# Patient Record
Sex: Female | Born: 1955
Health system: Southern US, Community
[De-identification: ages and names within clinical notes are randomized; demographics above are authoritative.]

## PROBLEM LIST (undated history)

## (undated) DIAGNOSIS — L570 Actinic keratosis: Secondary | ICD-10-CM

## (undated) DIAGNOSIS — K219 Gastro-esophageal reflux disease without esophagitis: Secondary | ICD-10-CM

## (undated) DIAGNOSIS — E119 Type 2 diabetes mellitus without complications: Secondary | ICD-10-CM

## (undated) DIAGNOSIS — G4730 Sleep apnea, unspecified: Secondary | ICD-10-CM

## (undated) DIAGNOSIS — M1990 Unspecified osteoarthritis, unspecified site: Secondary | ICD-10-CM

## (undated) HISTORY — PX: CARDIAC CATHETERIZATION: SHX172

## (undated) HISTORY — DX: Actinic keratosis: L57.0

## (undated) HISTORY — PX: OTHER SURGICAL HISTORY: SHX169

---

## 2001-12-16 HISTORY — PX: ABDOMINAL HYSTERECTOMY: SHX81

## 2004-12-27 ENCOUNTER — Ambulatory Visit: Payer: Self-pay | Admitting: Obstetrics and Gynecology

## 2005-04-16 ENCOUNTER — Ambulatory Visit: Payer: Self-pay

## 2005-07-29 ENCOUNTER — Ambulatory Visit: Payer: Self-pay | Admitting: Orthopaedic Surgery

## 2006-02-06 ENCOUNTER — Ambulatory Visit: Payer: Self-pay | Admitting: Obstetrics and Gynecology

## 2006-02-27 ENCOUNTER — Ambulatory Visit: Payer: Self-pay | Admitting: Obstetrics and Gynecology

## 2007-06-15 ENCOUNTER — Ambulatory Visit: Payer: Self-pay | Admitting: Unknown Physician Specialty

## 2007-10-21 ENCOUNTER — Encounter: Payer: Self-pay | Admitting: Family Medicine

## 2007-11-16 ENCOUNTER — Encounter: Payer: Self-pay | Admitting: Family Medicine

## 2007-12-17 ENCOUNTER — Encounter: Payer: Self-pay | Admitting: Family Medicine

## 2007-12-17 HISTORY — PX: PARTIAL KNEE ARTHROPLASTY: SHX2174

## 2008-02-01 ENCOUNTER — Ambulatory Visit: Payer: Self-pay | Admitting: Sports Medicine

## 2009-02-23 ENCOUNTER — Ambulatory Visit: Payer: Self-pay | Admitting: Unknown Physician Specialty

## 2009-03-02 ENCOUNTER — Inpatient Hospital Stay: Payer: Self-pay | Admitting: Unknown Physician Specialty

## 2009-04-13 ENCOUNTER — Ambulatory Visit: Payer: Self-pay | Admitting: Unknown Physician Specialty

## 2009-12-16 HISTORY — PX: EYE SURGERY: SHX253

## 2011-02-04 ENCOUNTER — Encounter: Payer: Self-pay | Admitting: Dermatology

## 2012-12-11 ENCOUNTER — Encounter (HOSPITAL_COMMUNITY): Payer: Self-pay | Admitting: Respiratory Therapy

## 2012-12-15 NOTE — Pre-Procedure Instructions (Signed)
20 Elaine Wright  12/15/2012   Your procedure is scheduled on:  Wednesday, January 8th.  Report to [facility] at 6:30 AM.  Call this number if you have problems the morning of surgery: [phone]   Remember: Nothing to eat or drink after Midnight.    Take these medicines the morning of surgery with A SIP OF WATER: Omeprazole (Prilosec).              Stop taking Naproxen (Naprosyn).   Do not wear jewelry, make-up or nail polish.  Do not wear lotions, powders, or perfumes. You may wear deodorant.  Do not shave 48 hours prior to surgery. Men may shave face and neck.  Do not bring valuables to the hospital.  Contacts, dentures or bridgework may not be worn into surgery.  Leave suitcase in the car. After surgery it may be brought to your room.  For patients admitted to the hospital, checkout time is 11:00 AM the day of discharge.   Patients discharged the day of surgery will not be allowed to drive home.  Name and phone number of your driver: -NA    Special Instructions: Shower using CHG 2 nights before surgery and the night before surgery.  If you shower the day of surgery use CHG.  Use special wash - you have one bottle of CHG for all showers.  You should use approximately 1/3 of the bottle for each shower.   Please read over the following fact sheets that you were given: Pain Booklet, Coughing and Deep Breathing, Blood Transfusion Information and Surgical Site Infection Prevention

## 2012-12-17 ENCOUNTER — Encounter (HOSPITAL_COMMUNITY): Payer: Self-pay

## 2012-12-17 ENCOUNTER — Encounter (HOSPITAL_COMMUNITY)
Admission: RE | Admit: 2012-12-17 | Discharge: 2012-12-17 | Disposition: A | Discharge status: Home / Self Care | Payer: 59 | Source: Ambulatory Visit | Attending: Orthopedic Surgery | Admitting: Orthopedic Surgery

## 2012-12-17 ENCOUNTER — Ambulatory Visit (HOSPITAL_COMMUNITY)
Admission: RE | Admit: 2012-12-17 | Discharge: 2012-12-17 | Disposition: A | Discharge status: Home / Self Care | Payer: 59 | Source: Ambulatory Visit | Attending: Surgery | Admitting: Surgery

## 2012-12-17 DIAGNOSIS — Z01818 Encounter for other preprocedural examination: Secondary | ICD-10-CM | POA: Insufficient documentation

## 2012-12-17 DIAGNOSIS — F17200 Nicotine dependence, unspecified, uncomplicated: Secondary | ICD-10-CM | POA: Insufficient documentation

## 2012-12-17 HISTORY — DX: Unspecified osteoarthritis, unspecified site: M19.90

## 2012-12-17 HISTORY — DX: Gastro-esophageal reflux disease without esophagitis: K21.9

## 2012-12-17 LAB — URINALYSIS, ROUTINE W REFLEX MICROSCOPIC
Bilirubin Urine: NEGATIVE
Glucose, UA: NEGATIVE mg/dL
Hgb urine dipstick: NEGATIVE
Ketones, ur: NEGATIVE mg/dL
Leukocytes, UA: NEGATIVE
Nitrite: NEGATIVE
Protein, ur: NEGATIVE mg/dL
Specific Gravity, Urine: 1.009 (ref 1.005–1.030)
Urobilinogen, UA: 0.2 mg/dL (ref 0.0–1.0)
pH: 6.5 (ref 5.0–8.0)

## 2012-12-17 LAB — SURGICAL PCR SCREEN
MRSA, PCR: NEGATIVE
Staphylococcus aureus: NEGATIVE

## 2012-12-17 NOTE — Progress Notes (Signed)
Pt is unable to wear blood bank bracelet to work, appointment made for patient to come in on 12/23/11 for lab work.

## 2012-12-22 ENCOUNTER — Encounter (HOSPITAL_COMMUNITY)
Admission: RE | Admit: 2012-12-22 | Discharge: 2012-12-22 | Disposition: A | Discharge status: Home / Self Care | Payer: 59 | Source: Ambulatory Visit | Attending: Orthopedic Surgery | Admitting: Orthopedic Surgery

## 2012-12-22 LAB — COMPREHENSIVE METABOLIC PANEL
ALT: 20 U/L (ref 0–35)
AST: 19 U/L (ref 0–37)
Albumin: 4.2 g/dL (ref 3.5–5.2)
Alkaline Phosphatase: 85 U/L (ref 39–117)
BUN: 15 mg/dL (ref 6–23)
CO2: 29 mEq/L (ref 19–32)
Calcium: 9.4 mg/dL (ref 8.4–10.5)
Chloride: 99 mEq/L (ref 96–112)
Creatinine, Ser: 0.66 mg/dL (ref 0.50–1.10)
GFR calc Af Amer: >90 mL/min (ref >90)
GFR calc non Af Amer: >90 mL/min (ref >90)
Glucose, Bld: 100 mg/dL — ABNORMAL HIGH (ref 70–99)
Potassium: 4.4 mEq/L (ref 3.5–5.1)
Sodium: 138 mEq/L (ref 135–145)
Total Bilirubin: 0.2 mg/dL — ABNORMAL LOW (ref 0.3–1.2)
Total Protein: 7.3 g/dL (ref 6.0–8.3)

## 2012-12-22 LAB — ABO/RH: ABO/RH(D): A POS

## 2012-12-22 LAB — CBC
HCT: 41.1 % (ref 36.0–46.0)
Hemoglobin: 13.9 g/dL (ref 12.0–15.0)
MCH: 29.6 pg (ref 26.0–34.0)
MCHC: 33.8 g/dL (ref 30.0–36.0)
MCV: 87.4 fL (ref 78.0–100.0)
Platelets: 263 10*3/uL (ref 150–400)
RBC: 4.7 MIL/uL (ref 3.87–5.11)
RDW: 13.7 % (ref 11.5–15.5)
WBC: 7.8 10*3/uL (ref 4.0–10.5)

## 2012-12-22 LAB — APTT: aPTT: 34 seconds (ref 24–37)

## 2012-12-22 LAB — PROTIME-INR
INR: 0.86 (ref 0.00–1.49)
Prothrombin Time: 11.7 seconds (ref 11.6–15.2)

## 2012-12-22 LAB — TYPE AND SCREEN
ABO/RH(D): A POS
Antibody Screen: NEGATIVE

## 2012-12-22 MED ORDER — CEFAZOLIN SODIUM-DEXTROSE 2-3 GM-% IV SOLR
2.0000 g | INTRAVENOUS | Status: AC
  Administered 2012-12-23: 2 g via INTRAVENOUS
  Filled 2012-12-22: qty 50

## 2012-12-22 NOTE — Pre-Procedure Instructions (Signed)
20 Elaine Wright  12/22/2012   Your procedure is scheduled on:  Wednesday, January 8th.  Report to [facility] at 6:30AM.   Call this number if you have problems the morning of surgery: [phone]  Remember:Nothing to eat or drink after Midnight.     Take these medicines the morning of surgery with A SIP OF WATER:  May take Omeprazole (Prilosec).              Stop taking Naproxen.    Do not wear jewelry, make-up or nail polish.  Do not wear lotions, powders, or perfumes. You may wear deodorant.  Do not shave 48 hours prior to surgery. Men may shave face and neck.  Do not bring valuables to the hospital.  Contacts, dentures or bridgework may not be worn into surgery.  Leave suitcase in the car. After surgery it may be brought to your room.  For patients admitted to the hospital, checkout time is 11:00 AM the day of discharge.   Patients discharged the day of surgery will not be allowed to drive home.  Name and phone number of your driver:  NA  Special Instructions: Shower with CHG wash (Bactoshield) tonight and again in the am prior to arriving to hospital.    Please read over the following fact sheets that you were given: Pain Booklet, Coughing and Deep Breathing, Blood Transfusion Information and Surgical Site Infection Prevention

## 2012-12-22 NOTE — H&P (Signed)
  [facility] [address]   [address] [place], [place] [address] [phone] A Division of [facility]  Loreta Ave, M.D.   Robert A. Thurston Hole, M.D.   Burnell Blanks, M.D.   Eulas Post, M.D.   Lunette Stands, M.D Buford Dresser, M.D.  Charlsie Quest, M.D.   Estell Harpin, M.D.   Melina Fiddler, M.D. Genene Churn. Barry Dienes, PA-C            Kirstin A. Shepperson, PA-C Josh Poulsbo, PA-C [place], [place]   RE: Elaine Wright, Elaine Wright                                [id number]      DOB: 1955/12/22 PROGRESS NOTE: 12-15-12 Chief complaint: Left knee pain.  History of present illness: 57 year-old white female with a history of end stage DJD, left knee, and chronic pain.  Returns.  States that knee symptoms are unchanged from previous visit.  She is wanting to proceed with left total knee replacement as scheduled.  Patient stated that podiatrist, Dr. Charlsie Merles, wants to perform a bunionectomy procedure on her when she is about three weeks post-op from her knee surgery.  Said that he felt this would be a good time to do the surgery since she will be recovering from the left knee.   Current medications: Naprelan and Omeprazole. Allergies: No known drug allergies. Past medical/surgical history: Cataract surgery, right medial knee unicompartmental replacement in 2009 and left thumb surgery. Review of systems: Patient currently denies lightheadedness, dizziness, fevers or chills, cardiac, pulmonary, GI, GU or neuro issues. Family history: Positive for diabetes, hypertension and heart disease. Social history: Admits smoking one-quarter pack per day x 15 years.  Admits occasional alcohol consumption.  Patient is married and employed with [employer].    EXAMINATION: Height: 5?2.  Weight: 143 pounds.  Respirations: 20.  Blood pressure: 152/93.  Pulse: 67.  Temperature: 98.0.  Pleasant white female, alert and oriented x 3 and in  no acute distress.  Gait is antalgic.  No increase in respiratory effort.  Head is normocephalic, a traumatic.  PERRLA, EOMI.  Lungs: CTA bilaterally.  No wheezes.  Heart: RRR.  S1 and S2.  No murmurs.  Abdomen: Round and non-distended.  NBS x 4.  Soft and non-tender.  Left knee: Decreased range of motion.  Positive crepitus.  Positive effusion.  Joint line tender.  Ligaments stable.  Calf non-tender.  Neurovascularly intact.  Skin warm and dry.   X-RAYS: Previous films show end stage DJD with periarticular spurs.   IMPRESSION: End stage DJD, left knee, and chronic pain.  Failed conservative treatment.  PLAN: We will proceed with left total knee replacement as scheduled.  Surgical procedure, along with potential rehab/recovery time discussed.  All questions answered.  Advised patient that we do not recommend her having bunion surgery three weeks after her left total knee replacement is done.  She will discuss this with Dr. Charlsie Merles.  We will give her a better idea of timing for that procedure depending upon her recovery.  All questions answered.    Genene Churn. Barry Dienes, PA-C   Electronically verified by Loreta Ave, M.D. JMO:jjh D 12-15-12 T 12-17-12

## 2012-12-23 ENCOUNTER — Encounter (HOSPITAL_COMMUNITY): Payer: Self-pay | Admitting: Surgery

## 2012-12-23 ENCOUNTER — Inpatient Hospital Stay (HOSPITAL_COMMUNITY): Payer: 59 | Admitting: Certified Registered Nurse Anesthetist

## 2012-12-23 ENCOUNTER — Encounter (HOSPITAL_COMMUNITY)
Admission: RE | Disposition: A | Discharge status: Home Health | Payer: Self-pay | Source: Ambulatory Visit | Attending: Orthopedic Surgery

## 2012-12-23 ENCOUNTER — Inpatient Hospital Stay (HOSPITAL_COMMUNITY)
Admission: RE | Admit: 2012-12-23 | Discharge: 2012-12-25 | DRG: 470 | Disposition: A | Discharge status: Home Health | Payer: 59 | Source: Ambulatory Visit | Attending: Orthopedic Surgery | Admitting: Orthopedic Surgery

## 2012-12-23 ENCOUNTER — Inpatient Hospital Stay (HOSPITAL_COMMUNITY): Payer: 59

## 2012-12-23 ENCOUNTER — Encounter (HOSPITAL_COMMUNITY): Payer: Self-pay | Admitting: Certified Registered Nurse Anesthetist

## 2012-12-23 DIAGNOSIS — F17200 Nicotine dependence, unspecified, uncomplicated: Secondary | ICD-10-CM | POA: Diagnosis present

## 2012-12-23 DIAGNOSIS — Z96659 Presence of unspecified artificial knee joint: Secondary | ICD-10-CM

## 2012-12-23 DIAGNOSIS — M1710 Unilateral primary osteoarthritis, unspecified knee: Principal | ICD-10-CM | POA: Diagnosis present

## 2012-12-23 DIAGNOSIS — M25469 Effusion, unspecified knee: Secondary | ICD-10-CM | POA: Diagnosis present

## 2012-12-23 HISTORY — PX: TOTAL KNEE ARTHROPLASTY: SHX125

## 2012-12-23 LAB — CBC
HCT: 38.8 % (ref 36.0–46.0)
Hemoglobin: 12.8 g/dL (ref 12.0–15.0)
MCH: 29 pg (ref 26.0–34.0)
MCHC: 33 g/dL (ref 30.0–36.0)
MCV: 88 fL (ref 78.0–100.0)
Platelets: 237 10*3/uL (ref 150–400)
RBC: 4.41 MIL/uL (ref 3.87–5.11)
RDW: 13.7 % (ref 11.5–15.5)
WBC: 14 10*3/uL — ABNORMAL HIGH (ref 4.0–10.5)

## 2012-12-23 LAB — CREATININE, SERUM
Creatinine, Ser: 0.59 mg/dL (ref 0.50–1.10)
GFR calc Af Amer: >90 mL/min (ref >90)
GFR calc non Af Amer: >90 mL/min (ref >90)

## 2012-12-23 SURGERY — ARTHROPLASTY, KNEE, TOTAL
Anesthesia: General | Site: Knee | Laterality: Left | Wound class: Clean

## 2012-12-23 MED ORDER — ONDANSETRON HCL 4 MG PO TABS: 4.0000 mg | ORAL_TABLET | Freq: Four times a day (QID) | ORAL | Status: DC | PRN

## 2012-12-23 MED ORDER — FENTANYL CITRATE 0.05 MG/ML IJ SOLN
INTRAMUSCULAR | Status: DC | PRN
  Administered 2012-12-23 (×5): 50 ug via INTRAVENOUS

## 2012-12-23 MED ORDER — POTASSIUM CHLORIDE IN NACL 20-0.9 MEQ/L-% IV SOLN
INTRAVENOUS | Status: DC
  Administered 2012-12-23 – 2012-12-24 (×2): via INTRAVENOUS
  Filled 2012-12-23 (×5): qty 1000

## 2012-12-23 MED ORDER — MIDAZOLAM HCL 5 MG/5ML IJ SOLN
INTRAMUSCULAR | Status: DC | PRN
  Administered 2012-12-23: 2 mg via INTRAVENOUS

## 2012-12-23 MED ORDER — OXYCODONE HCL 5 MG/5ML PO SOLN: 5.0000 mg | Freq: Once | ORAL | Status: DC | PRN

## 2012-12-23 MED ORDER — METOCLOPRAMIDE HCL 10 MG PO TABS: 5.0000 mg | ORAL_TABLET | Freq: Three times a day (TID) | ORAL | Status: DC | PRN

## 2012-12-23 MED ORDER — PHENOL 1.4 % MT LIQD: 1.0000 | OROMUCOSAL | Status: DC | PRN

## 2012-12-23 MED ORDER — BUPIVACAINE LIPOSOME 1.3 % IJ SUSP
INTRAMUSCULAR | Status: DC | PRN
  Administered 2012-12-23: 20 mL

## 2012-12-23 MED ORDER — DOCUSATE SODIUM 100 MG PO CAPS
100.0000 mg | ORAL_CAPSULE | Freq: Two times a day (BID) | ORAL | Status: DC
  Administered 2012-12-23 – 2012-12-25 (×3): 100 mg via ORAL
  Filled 2012-12-23 (×5): qty 1

## 2012-12-23 MED ORDER — ONDANSETRON HCL 4 MG/2ML IJ SOLN: 4.0000 mg | Freq: Four times a day (QID) | INTRAMUSCULAR | Status: DC | PRN

## 2012-12-23 MED ORDER — HYDROMORPHONE HCL PF 1 MG/ML IJ SOLN
INTRAMUSCULAR | Status: AC
  Filled 2012-12-23: qty 1

## 2012-12-23 MED ORDER — DEXAMETHASONE SODIUM PHOSPHATE 4 MG/ML IJ SOLN
INTRAMUSCULAR | Status: DC | PRN
  Administered 2012-12-23: 4 mg via INTRAVENOUS

## 2012-12-23 MED ORDER — ONDANSETRON HCL 4 MG/2ML IJ SOLN
4.0000 mg | Freq: Four times a day (QID) | INTRAMUSCULAR | Status: DC | PRN
  Administered 2012-12-23: 4 mg via INTRAVENOUS
  Filled 2012-12-23: qty 2

## 2012-12-23 MED ORDER — ROCURONIUM BROMIDE 100 MG/10ML IV SOLN
INTRAVENOUS | Status: DC | PRN
  Administered 2012-12-23: 50 mg via INTRAVENOUS

## 2012-12-23 MED ORDER — HYDROMORPHONE HCL PF 1 MG/ML IJ SOLN
0.2500 mg | INTRAMUSCULAR | Status: DC | PRN
  Administered 2012-12-23 (×2): 0.5 mg via INTRAVENOUS

## 2012-12-23 MED ORDER — WARFARIN VIDEO: Freq: Once | Status: DC

## 2012-12-23 MED ORDER — HYDROCODONE-ACETAMINOPHEN 10-325 MG PO TABS
1.0000 | ORAL_TABLET | ORAL | Status: DC | PRN
  Administered 2012-12-24: 1 via ORAL
  Administered 2012-12-24: 2 via ORAL
  Administered 2012-12-24 – 2012-12-25 (×3): 1 via ORAL
  Filled 2012-12-23 (×3): qty 1
  Filled 2012-12-23: qty 2
  Filled 2012-12-23: qty 1

## 2012-12-23 MED ORDER — MENTHOL 3 MG MT LOZG: 1.0000 | LOZENGE | OROMUCOSAL | Status: DC | PRN

## 2012-12-23 MED ORDER — WARFARIN - PHARMACIST DOSING INPATIENT: Freq: Every day | Status: DC

## 2012-12-23 MED ORDER — SODIUM CHLORIDE 0.9 % IR SOLN
Status: DC | PRN
  Administered 2012-12-23: 3000 mL

## 2012-12-23 MED ORDER — SENNOSIDES-DOCUSATE SODIUM 8.6-50 MG PO TABS
1.0000 | ORAL_TABLET | Freq: Every evening | ORAL | Status: DC | PRN
  Administered 2012-12-25: 1 via ORAL
  Filled 2012-12-23: qty 1

## 2012-12-23 MED ORDER — PANTOPRAZOLE SODIUM 40 MG PO TBEC
40.0000 mg | DELAYED_RELEASE_TABLET | Freq: Every day | ORAL | Status: DC
  Administered 2012-12-23 – 2012-12-25 (×3): 40 mg via ORAL
  Filled 2012-12-23 (×3): qty 1

## 2012-12-23 MED ORDER — BUPIVACAINE HCL 0.25 % IJ SOLN
INTRAMUSCULAR | Status: DC | PRN
  Administered 2012-12-23: 10 mL

## 2012-12-23 MED ORDER — ENOXAPARIN SODIUM 30 MG/0.3ML ~~LOC~~ SOLN
30.0000 mg | SUBCUTANEOUS | Status: DC
  Administered 2012-12-24 – 2012-12-25 (×2): 30 mg via SUBCUTANEOUS
  Filled 2012-12-23 (×4): qty 0.3

## 2012-12-23 MED ORDER — CEFAZOLIN SODIUM 1-5 GM-% IV SOLN
1.0000 g | Freq: Three times a day (TID) | INTRAVENOUS | Status: AC
  Administered 2012-12-23 – 2012-12-24 (×2): 1 g via INTRAVENOUS
  Filled 2012-12-23 (×2): qty 50

## 2012-12-23 MED ORDER — METHOCARBAMOL 100 MG/ML IJ SOLN
500.0000 mg | Freq: Four times a day (QID) | INTRAVENOUS | Status: DC | PRN
  Administered 2012-12-23: 500 mg via INTRAVENOUS
  Filled 2012-12-23: qty 5

## 2012-12-23 MED ORDER — WARFARIN SODIUM 5 MG PO TABS
5.0000 mg | ORAL_TABLET | Freq: Once | ORAL | Status: AC
  Administered 2012-12-23: 5 mg via ORAL
  Filled 2012-12-23: qty 1

## 2012-12-23 MED ORDER — SODIUM CHLORIDE 0.9 % IV SOLN
INTRAVENOUS | Status: DC | PRN
  Administered 2012-12-23: 40 mL via INTRAMUSCULAR

## 2012-12-23 MED ORDER — PROPOFOL 10 MG/ML IV BOLUS
INTRAVENOUS | Status: DC | PRN
  Administered 2012-12-23: 150 mg via INTRAVENOUS

## 2012-12-23 MED ORDER — NEOSTIGMINE METHYLSULFATE 1 MG/ML IJ SOLN
INTRAMUSCULAR | Status: DC | PRN
  Administered 2012-12-23: 4 mg via INTRAVENOUS

## 2012-12-23 MED ORDER — LACTATED RINGERS IV SOLN
INTRAVENOUS | Status: DC | PRN
  Administered 2012-12-23 (×2): via INTRAVENOUS

## 2012-12-23 MED ORDER — METOCLOPRAMIDE HCL 5 MG/ML IJ SOLN: 5.0000 mg | Freq: Three times a day (TID) | INTRAMUSCULAR | Status: DC | PRN

## 2012-12-23 MED ORDER — BUPIVACAINE LIPOSOME 1.3 % IJ SUSP
20.0000 mL | Freq: Once | INTRAMUSCULAR | Status: DC
  Filled 2012-12-23: qty 20

## 2012-12-23 MED ORDER — ACETAMINOPHEN 650 MG RE SUPP: 650.0000 mg | Freq: Four times a day (QID) | RECTAL | Status: DC | PRN

## 2012-12-23 MED ORDER — LIDOCAINE HCL (CARDIAC) 20 MG/ML IV SOLN
INTRAVENOUS | Status: DC | PRN
  Administered 2012-12-23: 80 mg via INTRAVENOUS

## 2012-12-23 MED ORDER — OXYCODONE HCL 5 MG PO TABS: 5.0000 mg | ORAL_TABLET | Freq: Once | ORAL | Status: DC | PRN

## 2012-12-23 MED ORDER — COUMADIN BOOK
Freq: Once | Status: DC
  Filled 2012-12-23: qty 1

## 2012-12-23 MED ORDER — HYDROMORPHONE HCL PF 1 MG/ML IJ SOLN
0.5000 mg | INTRAMUSCULAR | Status: DC | PRN
  Administered 2012-12-23: 0.5 mg via INTRAVENOUS
  Filled 2012-12-23 (×2): qty 1

## 2012-12-23 MED ORDER — ACETAMINOPHEN 325 MG PO TABS
650.0000 mg | ORAL_TABLET | Freq: Four times a day (QID) | ORAL | Status: DC | PRN
  Administered 2012-12-24 – 2012-12-25 (×2): 650 mg via ORAL
  Filled 2012-12-23 (×2): qty 2

## 2012-12-23 MED ORDER — METHOCARBAMOL 500 MG PO TABS
500.0000 mg | ORAL_TABLET | Freq: Four times a day (QID) | ORAL | Status: DC | PRN
  Administered 2012-12-24 – 2012-12-25 (×3): 500 mg via ORAL
  Filled 2012-12-23 (×3): qty 1

## 2012-12-23 MED ORDER — GLYCOPYRROLATE 0.2 MG/ML IJ SOLN
INTRAMUSCULAR | Status: DC | PRN
  Administered 2012-12-23: 0.6 mg via INTRAVENOUS

## 2012-12-23 MED ORDER — ALUM & MAG HYDROXIDE-SIMETH 200-200-20 MG/5ML PO SUSP: 30.0000 mL | ORAL | Status: DC | PRN

## 2012-12-23 MED ORDER — BUPIVACAINE HCL (PF) 0.25 % IJ SOLN
INTRAMUSCULAR | Status: AC
  Filled 2012-12-23: qty 30

## 2012-12-23 SURGICAL SUPPLY — 55 items
BANDAGE ESMARK 6X9 LF (GAUZE/BANDAGES/DRESSINGS) ×1 IMPLANT
BLADE SAG 18X100X1.27 (BLADE) ×4 IMPLANT
BNDG ESMARK 6X9 LF (GAUZE/BANDAGES/DRESSINGS) ×2
BOOTCOVER CLEANROOM LRG (PROTECTIVE WEAR) ×4 IMPLANT
BOWL SMART MIX CTS (DISPOSABLE) ×2 IMPLANT
CEMENT BONE SIMPLEX SPEEDSET (Cement) ×4 IMPLANT
CLOTH BEACON ORANGE TIMEOUT ST (SAFETY) ×2 IMPLANT
COVER BACK TABLE 24X17X13 BIG (DRAPES) ×2 IMPLANT
COVER SURGICAL LIGHT HANDLE (MISCELLANEOUS) ×2 IMPLANT
CUFF TOURNIQUET SINGLE 34IN LL (TOURNIQUET CUFF) ×2 IMPLANT
DRAPE EXTREMITY T 121X128X90 (DRAPE) ×2 IMPLANT
DRAPE PROXIMA HALF (DRAPES) ×2 IMPLANT
DRAPE U-SHAPE 47X51 STRL (DRAPES) ×2 IMPLANT
DRSG PAD ABDOMINAL 8X10 ST (GAUZE/BANDAGES/DRESSINGS) ×2 IMPLANT
DURAPREP 26ML APPLICATOR (WOUND CARE) ×2 IMPLANT
ELECT CAUTERY BLADE 6.4 (BLADE) ×2 IMPLANT
ELECT REM PT RETURN 9FT ADLT (ELECTROSURGICAL) ×2
ELECTRODE REM PT RTRN 9FT ADLT (ELECTROSURGICAL) ×1 IMPLANT
EVACUATOR 1/8 PVC DRAIN (DRAIN) ×2 IMPLANT
FACESHIELD LNG OPTICON STERILE (SAFETY) ×2 IMPLANT
GAUZE XEROFORM 5X9 LF (GAUZE/BANDAGES/DRESSINGS) ×2 IMPLANT
GLOVE BIOGEL PI IND STRL 8 (GLOVE) ×1 IMPLANT
GLOVE BIOGEL PI INDICATOR 8 (GLOVE) ×1
GLOVE ORTHO TXT STRL SZ7.5 (GLOVE) ×2 IMPLANT
GOWN PREVENTION PLUS XLARGE (GOWN DISPOSABLE) ×4 IMPLANT
GOWN STRL NON-REIN LRG LVL3 (GOWN DISPOSABLE) ×4 IMPLANT
GOWN STRL REIN 2XL XLG LVL4 (GOWN DISPOSABLE) ×2 IMPLANT
HANDPIECE INTERPULSE COAX TIP (DISPOSABLE) ×1
IMMOBILIZER KNEE 22 UNIV (SOFTGOODS) ×2 IMPLANT
IMMOBILIZER KNEE 24 THIGH 36 (MISCELLANEOUS) IMPLANT
IMMOBILIZER KNEE 24 UNIV (MISCELLANEOUS)
KIT BASIN OR (CUSTOM PROCEDURE TRAY) ×2 IMPLANT
KIT ROOM TURNOVER OR (KITS) ×2 IMPLANT
MANIFOLD NEPTUNE II (INSTRUMENTS) ×2 IMPLANT
NS IRRIG 1000ML POUR BTL (IV SOLUTION) ×2 IMPLANT
PACK TOTAL JOINT (CUSTOM PROCEDURE TRAY) ×2 IMPLANT
PAD ARMBOARD 7.5X6 YLW CONV (MISCELLANEOUS) ×4 IMPLANT
PAD CAST 4YDX4 CTTN HI CHSV (CAST SUPPLIES) ×1 IMPLANT
PADDING CAST COTTON 4X4 STRL (CAST SUPPLIES) ×1
PADDING CAST COTTON 6X4 STRL (CAST SUPPLIES) ×2 IMPLANT
RUBBERBAND STERILE (MISCELLANEOUS) ×2 IMPLANT
SET HNDPC FAN SPRY TIP SCT (DISPOSABLE) ×1 IMPLANT
SPONGE GAUZE 4X4 12PLY (GAUZE/BANDAGES/DRESSINGS) ×2 IMPLANT
STAPLER VISISTAT 35W (STAPLE) ×2 IMPLANT
SUCTION FRAZIER TIP 10 FR DISP (SUCTIONS) ×2 IMPLANT
SUT VIC AB 1 CTX 36 (SUTURE) ×2
SUT VIC AB 1 CTX36XBRD ANBCTR (SUTURE) ×2 IMPLANT
SUT VIC AB 2-0 CT1 27 (SUTURE) ×2
SUT VIC AB 2-0 CT1 TAPERPNT 27 (SUTURE) ×2 IMPLANT
SYR 30ML LL (SYRINGE) ×2 IMPLANT
SYR 30ML SLIP (SYRINGE) ×2 IMPLANT
TOWEL OR 17X24 6PK STRL BLUE (TOWEL DISPOSABLE) ×2 IMPLANT
TOWEL OR 17X26 10 PK STRL BLUE (TOWEL DISPOSABLE) ×2 IMPLANT
TRAY FOLEY CATH 14FR (SET/KITS/TRAYS/PACK) ×2 IMPLANT
WATER STERILE IRR 1000ML POUR (IV SOLUTION) ×6 IMPLANT

## 2012-12-23 NOTE — Interval H&P Note (Signed)
History and Physical Interval Note:  12/23/2012 8:21 AM  Elaine Wright  has presented today for surgery, with the diagnosis of DJD LEFT KNEE  The various methods of treatment have been discussed with the patient and family. After consideration of risks, benefits and other options for treatment, the patient has consented to  Procedure(s) (LRB) with comments: TOTAL KNEE ARTHROPLASTY (Left) as a surgical intervention .  The patient's history has been reviewed, patient examined, no change in status, stable for surgery.  I have reviewed the patient's chart and labs.  Questions were answered to the patient's satisfaction.     Amit Meloy F

## 2012-12-23 NOTE — Brief Op Note (Signed)
12/23/2012  12:40 PM  PATIENT:  Lurlean Horns  57 y.o. female  PRE-OPERATIVE DIAGNOSIS:  DJD LEFT KNEE  POST-OPERATIVE DIAGNOSIS:  degenerative joint disease left knee  PROCEDURE:  Procedure(s) (LRB) with comments: TOTAL KNEE ARTHROPLASTY (Left)  SURGEON:  Surgeon(s) and Role:    * Loreta Ave, MD - Primary  PHYSICIAN ASSISTANT: Zonia Kief M   ANESTHESIA:   general  EBL:  Total I/O In: 1250 [I.V.:1000; Other:250] Out: 150 [Urine:150]   DRAINS:  hemovac  LOCAL MEDICATIONS USED:  OTHER .  Exparel/injectable NS  20:40   SPECIMEN:  No Specimen  DISPOSITION OF SPECIMEN:  N/A  COUNTS:  YES  TOURNIQUET:   Total Tourniquet Time Documented: Thigh (Left) - 64 minutes   PATIENT DISPOSITION:  PACU - hemodynamically stable.

## 2012-12-23 NOTE — Progress Notes (Signed)
Orthopedic Tech Progress Note Patient Details:  Elaine Wright Jun 09, 1956 [id number]  Patient ID: Elaine Wright, female   DOB: December 12, 1956, 57 y.o.   MRN: [id number] Trapeze bar patient helper Viewed order from doctor's order list Nikki Dom 12/23/2012, 4:57 PM

## 2012-12-23 NOTE — Evaluation (Signed)
Physical Therapy Evaluation Patient Details Name: Elaine Wright MRN: [id number] DOB: 01-26-1956 Today's Date: 12/23/2012 Time: 4098-1191 PT Time Calculation (min): 45 min  PT Assessment / Plan / Recommendation Clinical Impression  Pt is a 57 y/o female s/p L TKA.      PT Assessment  Patient needs continued PT services    Follow Up Recommendations  Home health PT;Supervision - Intermittent    Does the patient have the potential to tolerate intense rehabilitation    Yes  Barriers to Discharge  none      Equipment Recommendations  None recommended by PT    Recommendations for Other Services   None  Frequency 7X/week    Precautions / Restrictions Precautions Precautions: Knee Required Braces or Orthoses: Knee Immobilizer - Left Restrictions Weight Bearing Restrictions: Yes LLE Weight Bearing: Weight bearing as tolerated   Pertinent Vitals/Pain No c/o pain       Mobility  Bed Mobility Bed Mobility: Supine to Sit;Sit to Supine Supine to Sit: 4: Min assist;HOB flat Sit to Supine: 4: Min assist Details for Bed Mobility Assistance: Min assist for L LE  Transfers Transfers: Sit to Stand;Stand to [employer] Transfers Sit to Stand: 4: Min guard Stand to Sit: 4: Min guard Stand Pivot Transfers: 4: Min guard Details for Transfer Assistance: cues for technique.  Ambulation/Gait Ambulation/Gait Assistance: 4: Min guard Ambulation Distance (Feet): 15 Feet Assistive device: Rolling walker Ambulation/Gait Assistance Details: cues for sequencing and WBAT.   Gait Pattern: Step-to pattern Stairs: No Wheelchair Mobility Wheelchair Mobility: No     PT Diagnosis: Difficulty walking;Generalized weakness;Acute pain  PT Problem List: Decreased strength;Decreased range of motion;Decreased activity tolerance;Decreased balance;Decreased mobility;Decreased knowledge of use of DME;Pain PT Treatment Interventions: Gait training;DME instruction;Functional mobility training;Stair  training;Therapeutic activities;Neuromuscular re-education   PT Goals Acute Rehab PT Goals PT Goal Formulation: With patient Time For Goal Achievement: 12/30/12 Potential to Achieve Goals: Good Pt will go Supine/Side to Sit: Independently;with HOB 0 degrees PT Goal: Supine/Side to Sit - Progress: Goal set today Pt will go Sit to Supine/Side: Independently;with HOB 0 degrees PT Goal: Sit to Supine/Side - Progress: Goal set today Pt will go Sit to Stand: with supervision PT Goal: Sit to Stand - Progress: Goal set today Pt will go Stand to Sit: with modified independence PT Goal: Stand to Sit - Progress: Goal set today Pt will Ambulate: >150 feet;with modified independence;with least restrictive assistive device PT Goal: Ambulate - Progress: Goal set today Pt will Go Up / Down Stairs: 3-5 stairs;Flight;with supervision;with least restrictive assistive device PT Goal: Up/Down Stairs - Progress: Goal set today Pt will Perform Home Exercise Program: with supervision, verbal cues required/provided PT Goal: Perform Home Exercise Program - Progress: Goal set today  Visit Information  Last PT Received On: 12/23/12    Subjective Data  Subjective: Agree to PT eval Patient Stated Goal: Walk without pain.    Prior Functioning  Home Living Lives With: Spouse Available Help at Discharge: Family;Available 24 hours/day Type of Home: House Home Access: Stairs to enter [employer] of Steps: 3 Entrance Stairs-Rails: None Home Layout: 1/2 bath on main level;Two level Alternate Level Stairs-Number of Steps: 13 Alternate Level Stairs-Rails: Right Bathroom Shower/Tub: Tub/shower unit;Walk-in shower Bathroom Toilet: Standard Bathroom Accessibility: Yes How Accessible: Accessible via walker Home Adaptive Equipment: Walker - rolling;Bedside commode/3-in-1 Prior Function Level of Independence: Independent Able to Take Stairs?: Yes Driving: Yes Vocation: Full time  employment Communication Communication: No difficulties    Cognition  Overall Cognitive Status:  Appears within functional limits for tasks assessed/performed Arousal/Alertness: Awake/alert Orientation Level: Appears intact for tasks assessed Behavior During Session: [facility] for tasks performed    Extremity/Trunk Assessment Right Upper Extremity Assessment RUE ROM/Strength/Tone: [facility] for tasks assessed Left Upper Extremity Assessment LUE ROM/Strength/Tone: [facility] for tasks assessed Right Lower Extremity Assessment RLE ROM/Strength/Tone: [facility] for tasks assessed Left Lower Extremity Assessment LLE ROM/Strength/Tone: Unable to fully assess   Balance    End of Session PT - End of Session Equipment Utilized During Treatment: Gait belt;Left knee immobilizer Activity Tolerance: Patient tolerated treatment well;Other (comment) (c/o worsening nausea in sitting/standing. ) Patient left: in chair;with call bell/phone within reach Nurse Communication: Mobility status CPM Left Knee CPM Left Knee: Off Left Knee Flexion (Degrees): 60  Left Knee Extension (Degrees): 0  Additional Comments: CPM off at 1600  GP     Jalyn Rosero 12/23/2012, 6:35 PM  Ranyia Witting L. Lawerance Matsuo DPT [phone]

## 2012-12-23 NOTE — Progress Notes (Signed)
Report given to Katrece Roediger rn as caregiver 

## 2012-12-23 NOTE — Progress Notes (Signed)
ANTICOAGULATION CONSULT NOTE - Initial Consult  Pharmacy Consult for Coumadin Indication: VTE prophylaxis  Allergies  Allergen Reactions  . Morphine And Related Other (See Comments)    Confusion    Patient Measurements:   Weight ~ 143 lbs Height ~ 62"  Vital Signs: Temp: 98.2 F (36.8 C) (01/08 1438) Temp src: Oral (01/08 0639) BP: 119/83 mmHg (01/08 1438) Pulse Rate: 78  (01/08 1438)  Labs:  Basename 12/23/12 1534 12/22/12 1506  HGB 12.8 13.9  HCT 38.8 41.1  PLT 237 263  APTT -- 34  LABPROT -- 11.7  INR -- 0.86  HEPARINUNFRC -- --  CREATININE -- 0.66  CKTOTAL -- --  CKMB -- --  TROPONINI -- --    CrCl is unknown because there is no height on file for the current visit.   Medical History: Past Medical History  Diagnosis Date  . GERD (gastroesophageal reflux disease)   . Arthritis     Medications:  Prescriptions prior to admission  Medication Sig Dispense Refill  . naproxen (NAPROSYN) 500 MG tablet Take 500 mg by mouth daily.      Marland Kitchen omeprazole (PRILOSEC) 20 MG capsule Take 20 mg by mouth daily.        Assessment: 57 yo F admitted 12/23/2012 for L TKA.  To start Coumadin for post-op VTE prophylaxis.  Baseline INR = 0.86.  Goal of Therapy:  INR 2-3 Monitor platelets by anticoagulation protocol: Yes   Plan:  Coumadin 5mg  PO x 1 tonight. Daily INR. Coumadin book. Coumadin video.  [employer], Pharm.D., BCPS Clinical Pharmacist Pager [phone] 12/23/2012 4:19 PM

## 2012-12-23 NOTE — Progress Notes (Signed)
Orthopedic Tech Progress Note Patient Details:  Elaine Wright 30-Mar-1956 [id number]  CPM Left Knee CPM Left Knee: On Left Knee Flexion (Degrees): 60  Left Knee Extension (Degrees): 0    Shawnie Pons 12/23/2012, 12:47 PM

## 2012-12-23 NOTE — Anesthesia Preprocedure Evaluation (Addendum)
Anesthesia Evaluation  Patient identified by MRN, date of birth, ID band Patient awake    Reviewed: Allergy & Precautions, H&P , NPO status , Patient's Chart, lab work & pertinent test results  History of Anesthesia Complications Negative for: history of anesthetic complications  Airway Mallampati: I TM Distance: >3 FB Neck ROM: Full    Dental  (+) Teeth Intact and Dental Advisory Given   Pulmonary Current Smoker (1/2 ppd),          Cardiovascular negative cardio ROS      Neuro/Psych negative neurological ROS     GI/Hepatic Neg liver ROS, GERD-  Medicated and Controlled,  Endo/Other  negative endocrine ROS  Renal/GU negative Renal ROS     Musculoskeletal negative musculoskeletal ROS (+)   Abdominal   Peds  Hematology negative hematology ROS (+)   Anesthesia Other Findings   Reproductive/Obstetrics negative OB ROS                          Anesthesia Physical Anesthesia Plan  ASA: II  Anesthesia Plan: General and Regional   Post-op Pain Management: MAC Combined w/ Regional for Post-op pain   Induction: Intravenous  Airway Management Planned: LMA  Additional Equipment:   Intra-op Plan:   Post-operative Plan:   Informed Consent: I have reviewed the patients History and Physical, chart, labs and discussed the procedure including the risks, benefits and alternatives for the proposed anesthesia with the patient or authorized representative who has indicated his/her understanding and acceptance.     Plan Discussed with: CRNA and Surgeon  Anesthesia Plan Comments:         Anesthesia Quick Evaluation

## 2012-12-23 NOTE — Transfer of Care (Signed)
Immediate Anesthesia Transfer of Care Note  Patient: Elaine Wright  Procedure(s) Performed: Procedure(s) (LRB) with comments: TOTAL KNEE ARTHROPLASTY (Left)  Patient Location: PACU  Anesthesia Type:General  Level of Consciousness: awake, alert  and oriented  Airway & Oxygen Therapy: Patient Spontanous Breathing and Patient connected to nasal cannula oxygen  Post-op Assessment: Report given to PACU RN, Post -op Vital signs reviewed and stable and Patient moving all extremities  Post vital signs: Reviewed and stable  Complications: No apparent anesthesia complications

## 2012-12-23 NOTE — Anesthesia Postprocedure Evaluation (Signed)
Anesthesia Post Note  Patient: Elaine Wright  Procedure(s) Performed: Procedure(s) (LRB): TOTAL KNEE ARTHROPLASTY (Left)  Anesthesia type: General  Patient location: PACU  Post pain: Pain level controlled and Adequate analgesia  Post assessment: Post-op Vital signs reviewed, Patient's Cardiovascular Status Stable, Respiratory Function Stable, Patent Airway and Pain level controlled  Last Vitals:  Filed Vitals:   12/23/12 0639  BP: 112/80  Pulse: 70  Temp: 36.4 C  Resp: 18    Post vital signs: Reviewed and stable  Level of consciousness: awake, alert  and oriented  Complications: No apparent anesthesia complications

## 2012-12-23 NOTE — Preoperative (Signed)
Beta Blockers   Reason not to administer Beta Blockers:Not Applicable 

## 2012-12-24 ENCOUNTER — Encounter (HOSPITAL_COMMUNITY): Payer: Self-pay | Admitting: Orthopedic Surgery

## 2012-12-24 LAB — CBC
HCT: 32.7 % — ABNORMAL LOW (ref 36.0–46.0)
Hemoglobin: 11 g/dL — ABNORMAL LOW (ref 12.0–15.0)
MCH: 29.3 pg (ref 26.0–34.0)
MCHC: 33.6 g/dL (ref 30.0–36.0)
MCV: 87.2 fL (ref 78.0–100.0)
Platelets: 219 10*3/uL (ref 150–400)
RBC: 3.75 MIL/uL — ABNORMAL LOW (ref 3.87–5.11)
RDW: 14 % (ref 11.5–15.5)
WBC: 11.6 10*3/uL — ABNORMAL HIGH (ref 4.0–10.5)

## 2012-12-24 LAB — BASIC METABOLIC PANEL
BUN: 10 mg/dL (ref 6–23)
CO2: 27 mEq/L (ref 19–32)
Calcium: 8.3 mg/dL — ABNORMAL LOW (ref 8.4–10.5)
Chloride: 104 mEq/L (ref 96–112)
Creatinine, Ser: 0.6 mg/dL (ref 0.50–1.10)
GFR calc Af Amer: >90 mL/min (ref >90)
GFR calc non Af Amer: >90 mL/min (ref >90)
Glucose, Bld: 121 mg/dL — ABNORMAL HIGH (ref 70–99)
Potassium: 4.3 mEq/L (ref 3.5–5.1)
Sodium: 139 mEq/L (ref 135–145)

## 2012-12-24 LAB — PROTIME-INR
INR: 1.13 (ref 0.00–1.49)
Prothrombin Time: 14.3 seconds (ref 11.6–15.2)

## 2012-12-24 MED ORDER — WARFARIN SODIUM 4 MG PO TABS
4.0000 mg | ORAL_TABLET | Freq: Once | ORAL | Status: AC
  Administered 2012-12-24: 4 mg via ORAL
  Filled 2012-12-24: qty 1

## 2012-12-24 NOTE — Progress Notes (Signed)
ANTICOAGULATION CONSULT NOTE - Follow Up Consult  Pharmacy Consult for Coumadin Indication: VTE prophylaxis  Allergies  Allergen Reactions  . Morphine And Related Other (See Comments)    Confusion    Patient Measurements:  Weight: 64.8kg  Vital Signs: Temp: 98 F (36.7 C) (01/09 1318) Temp src: Oral (01/09 0540) BP: 114/75 mmHg (01/09 1318) Pulse Rate: 89  (01/09 1318)  Labs:  Basename 12/24/12 0542 12/23/12 1534 12/22/12 1506  HGB 11.0* 12.8 --  HCT 32.7* 38.8 41.1  PLT 219 237 263  APTT -- -- 34  LABPROT 14.3 -- 11.7  INR 1.13 -- 0.86  HEPARINUNFRC -- -- --  CREATININE 0.60 0.59 0.66  CKTOTAL -- -- --  CKMB -- -- --  TROPONINI -- -- --    CrCl is unknown because there is no height on file for the current visit.   Medications:  Lovenox 30mg  SQ q24h  Assessment: 56yof on Coumadin for VTE prophylaxis s/p TKA. INR (1.13) is subtherapeutic as expected. Patient is also receiving Lovenox until INR >1.8 per MD order. - H/H trend down, Plts stable - No significant bleeding reported  Goal of Therapy:  INR 2-3   Plan:  1. Coumadin 4mg  po x 1 today 2. Follow-up AM INR  Cleon Dew [phone] 12/24/2012,1:22 PM

## 2012-12-24 NOTE — Evaluation (Signed)
Occupational Therapy Evaluation Patient Details Name: Elaine Wright MRN: [id number] DOB: 1956-09-27 Today's Date: 12/24/2012 Time: 4098-1191 OT Time Calculation (min): 27 min  OT Assessment / Plan / Recommendation Clinical Impression  Pt doing well POD 1 LTKR. All education completed. Pt will have necessary level of A from family upon d/c.     OT Assessment  Patient does not need any further OT services    Follow Up Recommendations  No OT follow up    Barriers to Discharge      Equipment Recommendations  None recommended by OT    Recommendations for Other Services    Frequency       Precautions / Restrictions Precautions Precautions: Knee Required Braces or Orthoses: Knee Immobilizer - Left Knee Immobilizer - Left: On except when in CPM Restrictions Weight Bearing Restrictions: No LLE Weight Bearing: Weight bearing as tolerated   Pertinent Vitals/Pain Pt reported 5/10 pain with mobility. Repositioned for comfort.    ADL  Grooming: Performed;Wash/dry hands;Min guard Where Assessed - Grooming: Supported standing Upper Body Bathing: Set up Where Assessed - Upper Body Bathing: Unsupported sitting Lower Body Bathing: Minimal assistance Where Assessed - Lower Body Bathing: Supported sit to stand Upper Body Dressing: Set up Where Assessed - Upper Body Dressing: Unsupported sitting Lower Body Dressing: Minimal assistance Where Assessed - Lower Body Dressing: Supported sit to Pharmacist, hospital: Hydrographic surveyor Method: Sit to Barista: Comfort height toilet;Grab bars Toileting - Architect and Hygiene: Min guard Where Assessed - Engineer, mining and Hygiene: Sit to stand from 3-in-1 or toilet Equipment Used: Rolling walker;Gait belt;Reacher Transfers/Ambulation Related to ADLs: Pt ambulated to the bathroom with minguard A and RW. ADL Comments: Educated pt how to safely step into walk in shower and complete LB  dressing with reacher. Pt with good return demo.     OT Diagnosis:    OT Problem List:   OT Treatment Interventions:     OT Goals    Visit Information  Last OT Received On: 12/24/12 Assistance Needed: +1    Subjective Data  Subjective: I'm not feeling too bad... Patient Stated Goal: Not asked.   Prior Functioning     Home Living Lives With: Spouse Available Help at Discharge: Family Type of Home: House Home Access: Stairs to enter Secretary/administrator of Steps: 3 Entrance Stairs-Rails: None Home Layout: Two level;1/2 bath on main level Alternate Level Stairs-Number of Steps: 13 Alternate Level Stairs-Rails: Right Bathroom Shower/Tub: Walk-in shower;Tub/shower unit Bathroom Toilet: Standard Home Adaptive Equipment: Built-in shower seat;Shower chair with back;Walker - rolling;Other (comment) (toilet riser with handles.) Prior Function Level of Independence: Independent Able to Take Stairs?: Yes Driving: Yes Vocation: Full time employment Communication Communication: No difficulties Dominant Hand: Right         Vision/Perception     Cognition  Overall Cognitive Status: Appears within functional limits for tasks assessed/performed Arousal/Alertness: Awake/alert Orientation Level: Appears intact for tasks assessed Behavior During Session: [facility] for tasks performed    Extremity/Trunk Assessment Right Upper Extremity Assessment RUE ROM/Strength/Tone: [facility] for tasks assessed Left Upper Extremity Assessment LUE ROM/Strength/Tone: [facility] for tasks assessed     Mobility .  Transfers Sit to Stand: 4: Min guard;With upper extremity assist;With armrests;From chair/3-in-1;From toilet Stand to Sit: 4: Min guard;With upper extremity assist;With armrests;To chair/3-in-1;To toilet Details for Transfer Assistance: min cues for hand placement and LE management.     Shoulder Instructions     Exercise    Balance  End of Session OT - End of Session Equipment  Utilized During Treatment: Gait belt Activity Tolerance: Patient tolerated treatment well Patient left: in chair;with call bell/phone within reach;with family/visitor present  GO     Amiah Frohlich A OTR/L [phone] 12/24/2012, 11:42 AM

## 2012-12-24 NOTE — Progress Notes (Signed)
CARE MANAGEMENT NOTE 12/24/2012  Patient:  Elaine Wright, Elaine Wright   Account Number:  [id number]  Date Initiated:  12/24/2012  Documentation initiated by:  Vance Peper  Subjective/Objective Assessment:   57 yr old female s/p left total knee arthroplasty.     Action/Plan:   CM spoke with patient concerning home health and DME needs at discharge. Patient preoperatively setup with [facility], no changes.Patient has rolling walker, 3in1 and CPM to be delivered to her home. Family support.   Anticipated DC Date:  12/25/2012   Anticipated DC Plan:  HOME W HOME HEALTH SERVICES      DC Planning Services  CM consult      [facility] Choice  HOME HEALTH   Choice offered to / List presented to:  C-1 Patient        HH arranged  HH-2 PT  HH-1 RN      [facility] agency  [facility]   Status of service:  Completed, signed off Medicare Important Message given?   (If response is "NO", the following Medicare IM given date fields will be blank) Date Medicare IM given:   Date Additional Medicare IM given:    Discharge Disposition:  HOME W HOME HEALTH SERVICES  Per UR Regulation:    If discussed at Long Length of Stay Meetings, dates discussed:    Comments:

## 2012-12-24 NOTE — Progress Notes (Signed)
Physical Therapy Treatment Patient Details Name: Elaine Wright MRN: [id number] DOB: Nov 25, 1956 Today's Date: 12/24/2012 Time: 0902-0922 PT Time Calculation (min): 20 min  PT Assessment / Plan / Recommendation Comments on Treatment Session  Pt doing well with mobility.  C/o feeling "overheated" while ambulating.  Resolved once pt sitting in chair.      Follow Up Recommendations  Home health PT;Supervision - Intermittent     Does the patient have the potential to tolerate intense rehabilitation     Barriers to Discharge        Equipment Recommendations  None recommended by PT    Recommendations for Other Services    Frequency 7X/week   Plan Discharge plan remains appropriate;Frequency remains appropriate    Precautions / Restrictions Precautions Precautions: Knee Required Braces or Orthoses: Knee Immobilizer - Left Knee Immobilizer - Left: On except when in CPM Restrictions Weight Bearing Restrictions: No LLE Weight Bearing: Weight bearing as tolerated   Pertinent Vitals/Pain 3/10 pain in knee.  No intervention required per pt.      Mobility  Bed Mobility Bed Mobility: Supine to Sit;Sitting - Scoot to Edge of Bed Supine to Sit: 5: Supervision Sitting - Scoot to Edge of Bed: 5: Supervision Details for Bed Mobility Assistance: Verbal cueing for technique.  Transfers Transfers: Sit to Stand;Stand to Sit Sit to Stand: 4: Min guard Stand to Sit: 4: Min guard Details for Transfer Assistance: cues for technique.  Ambulation/Gait Ambulation/Gait Assistance: 4: Min guard Ambulation Distance (Feet): 45 Feet Assistive device: Rolling walker Ambulation/Gait Assistance Details: Verbal and tactile cueing to decrease dependence on UEs  Gait Pattern: Step-to pattern;Antalgic Stairs: No    Exercises Total Joint Exercises Ankle Circles/Pumps: Both;10 reps   PT Diagnosis:    PT Problem List:   PT Treatment Interventions:     PT Goals Acute Rehab PT Goals PT Goal  Formulation: With patient Time For Goal Achievement: 12/30/12 Potential to Achieve Goals: Good Pt will go Supine/Side to Sit: Independently;with HOB 0 degrees PT Goal: Supine/Side to Sit - Progress: Progressing toward goal Pt will go Sit to Supine/Side: Independently;with HOB 0 degrees PT Goal: Sit to Supine/Side - Progress: Progressing toward goal Pt will go Sit to Stand: with supervision PT Goal: Sit to Stand - Progress: Progressing toward goal Pt will go Stand to Sit: with modified independence PT Goal: Stand to Sit - Progress: Progressing toward goal Pt will Ambulate: >150 feet;with modified independence;with least restrictive assistive device PT Goal: Ambulate - Progress: Progressing toward goal Pt will Go Up / Down Stairs: 3-5 stairs;Flight;with supervision;with least restrictive assistive device Pt will Perform Home Exercise Program: with supervision, verbal cues required/provided PT Goal: Perform Home Exercise Program - Progress: Progressing toward goal  Visit Information  Last PT Received On: 12/24/12 Assistance Needed: +1    Subjective Data  Subjective: I feel pretty good   Cognition  Overall Cognitive Status: Appears within functional limits for tasks assessed/performed Arousal/Alertness: Awake/alert Orientation Level: Appears intact for tasks assessed Behavior During Session: [facility] for tasks performed    Balance     End of Session PT - End of Session Equipment Utilized During Treatment: Gait belt;Left knee immobilizer Activity Tolerance: Patient tolerated treatment well;Other (comment) Patient left: in chair;with call bell/phone within reach Nurse Communication: Mobility status   GP     Tarrence Enck 12/24/2012, 10:35 AM Theron Arista L. Gussie Murton DPT [phone]

## 2012-12-24 NOTE — Progress Notes (Signed)
Subjective: Doing well.  Pain controlled.  No complaints.  C/o feeling dizzy when up with therapy yesterday.    Objective: Vital signs in last 24 hours: Temp:  [97.2 F (36.2 C)-99 F (37.2 C)] 98.9 F (37.2 C) (01/09 0540) Pulse Rate:  [67-95] 83  (01/09 0540) Resp:  [12-18] 18  (01/09 0540) BP: (95-168)/(65-89) 107/66 mmHg (01/09 0540) SpO2:  [92 %-100 %] 99 % (01/09 0540) FiO2 (%):  [2 %] 2 % (01/08 1130)  Intake/Output from previous day: 01/08 0701 - 01/09 0700 In: 2535.2 [I.V.:2153.2; IV Piggyback:57] Out: 3800 [Urine:3300; Drains:500] Intake/Output this shift:     Basename 12/24/12 0542 12/23/12 1534 12/22/12 1506  HGB 11.0* 12.8 13.9    Basename 12/24/12 0542 12/23/12 1534  WBC 11.6* 14.0*  RBC 3.75* 4.41  HCT 32.7* 38.8  PLT 219 237    Basename 12/24/12 0542 12/23/12 1534 12/22/12 1506  NA 139 -- 138  K 4.3 -- 4.4  CL 104 -- 99  CO2 27 -- 29  BUN 10 -- 15  CREATININE 0.60 0.59 --  GLUCOSE 121* -- 100*  CALCIUM 8.3* -- 9.4    Basename 12/24/12 0542 12/22/12 1506  LABPT -- --  INR 1.13 0.86    Exam:  Dressing c/d/i.  Calf nt, nvi.   Assessment/Plan: Continue rehab.  Anticipate d/c home Friday or Saturday. D/c dilaudid.     Advait Buice M 12/24/2012, 9:13 AM

## 2012-12-24 NOTE — Progress Notes (Signed)
Physical Therapy Treatment Patient Details Name: Elaine Wright MRN: [id number] DOB: 1956-06-23 Today's Date: 12/24/2012 Time: 4098-1191 PT Time Calculation (min): 39 min  PT Assessment / Plan / Recommendation Comments on Treatment Session  Pt continues to feel "whoozy" in standing/ambulating.  Wiill benefit from assessment to rule orthostatic hypotension.      Follow Up Recommendations  Home health PT;Supervision - Intermittent     Does the patient have the potential to tolerate intense rehabilitation     Barriers to Discharge        Equipment Recommendations  None recommended by PT    Recommendations for Other Services    Frequency 7X/week   Plan Discharge plan remains appropriate;Frequency remains appropriate    Precautions / Restrictions Precautions Precautions: Knee Required Braces or Orthoses: Knee Immobilizer - Left Knee Immobilizer - Left: On except when in CPM Restrictions Weight Bearing Restrictions: No LLE Weight Bearing: Weight bearing as tolerated   Pertinent Vitals/Pain 7/10 pain in L knee RN notified.     Mobility  Bed Mobility Bed Mobility: Supine to Sit;Sitting - Scoot to Edge of Bed Supine to Sit: 5: Supervision Sitting - Scoot to Edge of Bed: 5: Supervision Sit to Supine: 4: Min assist Details for Bed Mobility Assistance: min assist for L LE secondary to pain. Transfers Transfers: Sit to Stand;Stand to Sit Sit to Stand: 4: Min guard Stand to Sit: 4: Min guard;To chair/3-in-1;To bed;With upper extremity assist Stand Pivot Transfers: 4: Min guard Details for Transfer Assistance: verbal cues for safe technique. Pt attempted to sit prematurely secondary to pain.   Ambulation/Gait Ambulation/Gait Assistance: 4: Min guard Ambulation Distance (Feet): 15 Feet Assistive device: Rolling walker Ambulation/Gait Assistance Details: cues for L heel contact during L stance phase.   Gait Pattern: Step-to pattern;Antalgic Stairs: No Wheelchair  Mobility Wheelchair Mobility: No    Exercises Total Joint Exercises Ankle Circles/Pumps: Both;10 reps Quad Sets: 5 reps;Left Knee Flexion: 5 reps;Left Goniometric ROM: 6-93 degrees AAROM. (six degrees short of full ext)   PT Diagnosis:    PT Problem List:   PT Treatment Interventions:     PT Goals Acute Rehab PT Goals PT Goal Formulation: With patient Time For Goal Achievement: 12/30/12 Potential to Achieve Goals: Good Pt will go Supine/Side to Sit: Independently;with HOB 0 degrees PT Goal: Supine/Side to Sit - Progress: Progressing toward goal Pt will go Sit to Supine/Side: Independently;with HOB 0 degrees PT Goal: Sit to Supine/Side - Progress: Progressing toward goal Pt will go Sit to Stand: with supervision PT Goal: Sit to Stand - Progress: Progressing toward goal Pt will go Stand to Sit: with modified independence PT Goal: Stand to Sit - Progress: Progressing toward goal Pt will Ambulate: >150 feet;with modified independence;with least restrictive assistive device PT Goal: Ambulate - Progress: Progressing toward goal Pt will Go Up / Down Stairs: 3-5 stairs;Flight;with supervision;with least restrictive assistive device Pt will Perform Home Exercise Program: with supervision, verbal cues required/provided PT Goal: Perform Home Exercise Program - Progress: Progressing toward goal  Visit Information  Last PT Received On: 12/24/12    Subjective Data  Subjective: c/o fatigue and getting dizzy whenever she gets up.  Patient Stated Goal: Walk without pain.    Cognition  Overall Cognitive Status: Appears within functional limits for tasks assessed/performed Arousal/Alertness: Awake/alert Orientation Level: Appears intact for tasks assessed Behavior During Session: [facility] for tasks performed    Balance  Balance Balance Assessed: No  End of Session PT - End of Session Equipment Utilized During Treatment:  Gait belt;Left knee immobilizer Activity Tolerance: Patient limited by  pain;Patient limited by fatigue Patient left: in bed;with call bell/phone within reach;with family/visitor present Nurse Communication: Mobility status   GP     Lei Dower 12/24/2012, 5:57 PM Fuad Forget L. Willi Borowiak DPT [phone]

## 2012-12-24 NOTE — Op Note (Signed)
NAMEMarland Kitchen  ELECTA, STERRY NO.:  [id number]  MEDICAL RECORD NO.:  [id number]  LOCATION:  5N19C                        FACILITY:  [facility]  PHYSICIAN:  Loreta Ave, M.D. DATE OF BIRTH:  [birth year]  DATE OF PROCEDURE:  12/23/2012 DATE OF DISCHARGE:                              OPERATIVE REPORT   PREOPERATIVE DIAGNOSIS:  Left knee end-stage degenerative arthritis, varus alignment.  POSTOPERATIVE DIAGNOSIS:  Left knee end-stage degenerative arthritis, varus alignment.  PROCEDURE:  Modified minimally invasive left total knee replacement, Stryker triathlon prosthesis.  Soft tissue balancing.  Cemented pegged posterior stabilized #3 femoral component.  Cemented #3 tibial component, 9 mm polyethylene insert.  Cemented resurfacing 32 mm patellar component.  SURGEON:  Loreta Ave, M.D.  ASSISTANT:  Genene Churn. Barry Dienes, [place], present throughout the entire case and necessary for timely completion of procedure.  ANESTHESIA:  General.  BLOOD LOSS:  Minimal.  SPECIMENS:  None.  CULTURES:  None.  COMPLICATIONS:  None.  DRESSINGS:  Soft compressive knee immobilizer.  DRAINS:  Hemovac x1.  TOURNIQUET TIME:  45 minutes.  PROCEDURE:  The patient was brought to the operating room, placed on the operating table in a supine position.  After adequate anesthesia had been obtained, tourniquet applied, prepped and draped in usual sterile fashion.  Exsanguinated with elevation Esmarch.  Tourniquet inflated to 350 mmHg.  Knee examined.  5 degrees of varus correctable.  Good extension.  Anterior incision above the patella down the tibial tubercle.  Hemostasis with cautery.  Medial arthrotomy, vastus splitting, preserving quad tendon.  Knee exposed.  Grade 4 change throughout, most marked medially.  Medial capsular release.  Remnants of menisci, cruciate ligaments, periarticular spurs removed. Intramedullary guide on the femur.  Distal cut 8 mm 5 degrees of valgus. Using  epicondylar axis, the femur was sized, cut, and fitted for posterior stabilized pegged #3 component.  Proximal tibial resection. Extramedullary guide.  A 3-degree posterior slope cut.  Size #3 component.  Loose bodies and debris cleared throughout.  Large flabella in the posterolateral aspect, no other loose bodies.  Patella exposed. Posterior 10 mm removed.  Drilled, sized, and fitted for a 32 mm component.  Trials put in place.  #3 on the femur, #3 on the tibia, 9 mm insert, 32 patella.  With this construct, excellent biomechanical axis. Full motion, nicely balanced knee in flexion/extension.  Good patellofemoral tracking.  Tibia was marked for rotation and reamed.  All trials removed.  Copious irrigation with pulse irrigating device. Cement prepared, placed on all components, firmly seated.  Polyethylene attached to tibia, knee reduced.  Patella held with a clamp.  Once cement hardened, the knee was irrigated once again.  Soft tissues injected with Exparel.  Hemovac was placed and brought out through a separate stab wound.  Arthrotomy closed with #1 Vicryl.  Skin and subcutaneous tissue with Vicryl and staples.  Sterile compressive dressing applied.  Tourniquet deflated and removed.  Knee immobilizer applied.  Anesthesia reversed.  Brought to the recovery room.  Tolerated surgery well.  No complications.     Loreta Ave, M.D.     DFM/MEDQ  D:  12/23/2012  T:  12/24/2012  Job:  [id number] 

## 2012-12-25 ENCOUNTER — Encounter (HOSPITAL_COMMUNITY): Payer: Self-pay | Admitting: General Practice

## 2012-12-25 LAB — CBC
HCT: 31.5 % — ABNORMAL LOW (ref 36.0–46.0)
Hemoglobin: 10.4 g/dL — ABNORMAL LOW (ref 12.0–15.0)
MCH: 29 pg (ref 26.0–34.0)
MCHC: 33 g/dL (ref 30.0–36.0)
MCV: 87.7 fL (ref 78.0–100.0)
Platelets: 202 10*3/uL (ref 150–400)
RBC: 3.59 MIL/uL — ABNORMAL LOW (ref 3.87–5.11)
RDW: 13.8 % (ref 11.5–15.5)
WBC: 8.4 10*3/uL (ref 4.0–10.5)

## 2012-12-25 LAB — PROTIME-INR
INR: 1.71 — ABNORMAL HIGH (ref 0.00–1.49)
Prothrombin Time: 19.5 seconds — ABNORMAL HIGH (ref 11.6–15.2)

## 2012-12-25 LAB — BASIC METABOLIC PANEL
BUN: 8 mg/dL (ref 6–23)
CO2: 28 mEq/L (ref 19–32)
Calcium: 8.2 mg/dL — ABNORMAL LOW (ref 8.4–10.5)
Chloride: 103 mEq/L (ref 96–112)
Creatinine, Ser: 0.49 mg/dL — ABNORMAL LOW (ref 0.50–1.10)
GFR calc Af Amer: >90 mL/min (ref >90)
GFR calc non Af Amer: >90 mL/min (ref >90)
Glucose, Bld: 127 mg/dL — ABNORMAL HIGH (ref 70–99)
Potassium: 3.8 mEq/L (ref 3.5–5.1)
Sodium: 138 mEq/L (ref 135–145)

## 2012-12-25 MED ORDER — OXYCODONE-ACETAMINOPHEN 5-325 MG PO TABS: 1.0000 | ORAL_TABLET | ORAL | Status: DC | PRN

## 2012-12-25 MED ORDER — WARFARIN SODIUM 4 MG PO TABS
4.0000 mg | ORAL_TABLET | Freq: Once | ORAL | Status: DC
  Filled 2012-12-25: qty 1

## 2012-12-25 MED ORDER — ENOXAPARIN SODIUM 30 MG/0.3ML ~~LOC~~ SOLN: 30.0000 mg | Freq: Two times a day (BID) | SUBCUTANEOUS | Status: DC

## 2012-12-25 MED ORDER — WARFARIN SODIUM 5 MG PO TABS: 5.0000 mg | ORAL_TABLET | Freq: Every day | ORAL | Status: DC

## 2012-12-25 MED ORDER — METHOCARBAMOL 500 MG PO TABS: 500.0000 mg | ORAL_TABLET | Freq: Four times a day (QID) | ORAL | Status: DC | PRN

## 2012-12-25 NOTE — Progress Notes (Addendum)
Physical Therapy Treatment Patient Details Name: Elaine Wright MRN: [id number] DOB: May 29, 1956 Today's Date: 12/25/2012 Time: 4098-1191 PT Time Calculation (min): 30 min  PT Assessment / Plan / Recommendation Comments on Treatment Session  Pt is appropriate to d/c to home.     Follow Up Recommendations  Home health PT;Supervision - Intermittent     Does the patient have the potential to tolerate intense rehabilitation     Barriers to Discharge        Equipment Recommendations  None recommended by PT    Recommendations for Other Services    Frequency 7X/week   Plan All goals met and education completed, patient dischaged from PT services    Precautions / Restrictions Precautions Precautions: Knee Required Braces or Orthoses: Knee Immobilizer - Left Restrictions Weight Bearing Restrictions: No LLE Weight Bearing: Weight bearing as tolerated   Pertinent Vitals/Pain 3-6/10 pain in knee during session. Pt resting comfortably in CPM at end of session.      Mobility  Bed Mobility Bed Mobility: Not assessed Transfers Transfers: Not assessed Ambulation/Gait Ambulation/Gait Assistance: Not tested (comment)    Exercises Total Joint Exercises Ankle Circles/Pumps: Both;10 reps Quad Sets: Left;10 reps Short Arc Quad: Left;10 reps;Supine Heel Slides: Left;10 reps;Supine Straight Leg Raises: Left;10 reps;Supine Goniometric ROM: 0-83 degreees AROM   PT Diagnosis:    PT Problem List:   PT Treatment Interventions:     PT Goals Acute Rehab PT Goals PT Goal Formulation: With patient Time For Goal Achievement: 12/30/12 Potential to Achieve Goals: Good Pt Wright go Supine/Side to Sit: Independently;with HOB 0 degrees PT Goal: Supine/Side to Sit - Progress: Met Pt Wright go Sit to Supine/Side: Independently;with HOB 0 degrees PT Goal: Sit to Supine/Side - Progress: Met Pt Wright go Sit to Stand: with supervision PT Goal: Sit to Stand - Progress: Met Pt Wright go Stand to Sit: with  modified independence PT Goal: Stand to Sit - Progress: Met Pt Wright Ambulate: >150 feet;with modified independence;with least restrictive assistive device PT Goal: Ambulate - Progress: Partly met Pt Wright Go Up / Down Stairs: 3-5 stairs;Flight;with supervision;with least restrictive assistive device PT Goal: Up/Down Stairs - Progress: Met Pt Wright Perform Home Exercise Program: with supervision, verbal cues required/provided PT Goal: Perform Home Exercise Program - Progress: Met  Visit Information  Last PT Received On: 12/25/12    Subjective Data  Subjective: I feel good. Patient Stated Goal: Walk without pain.    Cognition  Overall Cognitive Status: Appears within functional limits for tasks assessed/performed Arousal/Alertness: Awake/alert Orientation Level: Appears intact for tasks assessed Behavior During Session: [facility] for tasks performed    Balance     End of Session PT - End of Session Equipment Utilized During Treatment: Gait belt;Left knee immobilizer Activity Tolerance: Patient tolerated treatment well Patient left: in bed;with call bell/phone within reach;in CPM;with family/visitor present Nurse Communication: Mobility status   GP     Pola Furno 12/25/2012, 3:40 PM Tyr Franca L. [place]land Want DPT [phone]

## 2012-12-25 NOTE — Progress Notes (Signed)
ANTICOAGULATION CONSULT NOTE - Follow Up Consult  Pharmacy Consult for coumadin Indication: VTE prophylaxis  Allergies  Allergen Reactions  . Morphine And Related Other (See Comments)    Confusion    Patient Measurements: Height: 5\' 2"  (157.5 cm) Weight: 142 lb 12.8 oz (64.774 kg) IBW/kg (Calculated) : 50.1  Heparin Dosing Weight:   Vital Signs: Temp: 98.4 F (36.9 C) (01/10 0502) BP: 118/78 mmHg (01/10 0502) Pulse Rate: 79  (01/10 0502)  Labs:  Basename 12/25/12 0600 12/24/12 0542 12/23/12 1534 12/22/12 1506  HGB 10.4* 11.0* -- --  HCT 31.5* 32.7* 38.8 --  PLT 202 219 237 --  APTT -- -- -- 34  LABPROT 19.5* 14.3 -- 11.7  INR 1.71* 1.13 -- 0.86  HEPARINUNFRC -- -- -- --  CREATININE 0.49* 0.60 0.59 --  CKTOTAL -- -- -- --  CKMB -- -- -- --  TROPONINI -- -- -- --    Estimated Creatinine Clearance: 68.6 ml/min (by C-G formula based on Cr of 0.49).   Medications:  Scheduled:    . coumadin book   Does not apply Once  . docusate sodium  100 mg Oral BID  . enoxaparin (LOVENOX) injection  30 mg Subcutaneous Q24H  . pantoprazole  40 mg Oral Daily  . [COMPLETED] warfarin  4 mg Oral ONCE-1800  . warfarin  4 mg Oral ONCE-1800  . warfarin   Does not apply Once  . Warfarin - Pharmacist Dosing Inpatient   Does not apply q1800   Infusions:    . [DISCONTINUED] 0.9 % NaCl with KCl 20 mEq / L 85 mL/hr at 12/24/12 0431    Assessment: 57 yo female s/p L TKA is currently on subtherapeutic coumadin.  INR up to 1.71 (probably due to the 5mg  she got few days ago) Goal of Therapy:  INR 2-3    Plan:  1) Coumadin 4mg  po x1 2) INR in am 3) D/c lovenox when INR > 1.8  Dinero Chavira, Tsz-Yin 12/25/2012,8:39 AM

## 2012-12-25 NOTE — Progress Notes (Signed)
Subjective: Seen this morning.  Doing well no complaints.  Ready to d/c home.  Objective: Vital signs in last 24 hours: Temp:  [98.3 F (36.8 C)-98.4 F (36.9 C)] 98.4 F (36.9 C) (01/10 0502) Pulse Rate:  [79-86] 79  (01/10 0502) Resp:  [18] 18  (01/10 0502) BP: (110-134)/(74-103) 124/91 mmHg (01/10 0931) SpO2:  [92 %-93 %] 92 % (01/10 0502)  Intake/Output from previous day: 01/09 0701 - 01/10 0700 In: 300 [P.O.:300] Out: 50 [Drains:50] Intake/Output this shift:     Basename 12/25/12 0600 12/24/12 0542 12/23/12 1534 12/22/12 1506  HGB 10.4* 11.0* 12.8 13.9    Basename 12/25/12 0600 12/24/12 0542  WBC 8.4 11.6*  RBC 3.59* 3.75*  HCT 31.5* 32.7*  PLT 202 219    Basename 12/25/12 0600 12/24/12 0542  NA 138 139  K 3.8 4.3  CL 103 104  CO2 28 27  BUN 8 10  CREATININE 0.49* 0.60  GLUCOSE 127* 121*  CALCIUM 8.2* 8.3*    Basename 12/25/12 0600 12/24/12 0542  LABPT -- --  INR 1.71* 1.13    Exam:  Wound looks good. Staples intact.  No drainage or signs of infection. Calf nt, nvi.  Drain removed.  Assessment/Plan: D/c home today.  F/u in office as scheduled.   OWENS,JAMES M 12/25/2012, 2:41 PM

## 2012-12-25 NOTE — Progress Notes (Signed)
Physical Therapy Treatment Patient Details Name: Elaine Wright MRN: [id number] DOB: 12/17/1955 Today's Date: 12/25/2012 Time: 4401-0272 PT Time Calculation (min): 43 min  PT Assessment / Plan / Recommendation Comments on Treatment Session  Pt tolerated activity a little better but still feeling overheated and light headed when ambulating. Pt negative for orthostatic hypotension (see below).  Will progress activity today to further assess pt tolerance.      Follow Up Recommendations  Home health PT;Supervision - Intermittent     Does the patient have the potential to tolerate intense rehabilitation     Barriers to Discharge        Equipment Recommendations  None recommended by PT    Recommendations for Other Services    Frequency 7X/week   Plan Discharge plan remains appropriate;Frequency remains appropriate    Precautions / Restrictions Precautions Precautions: Knee Precaution Booklet Issued: Yes (comment) Precaution Comments: Reviewed knee positioning restrictions and rationale with pt.   Required Braces or Orthoses: Knee Immobilizer - Left Knee Immobilizer - Left: On except when in CPM Restrictions Weight Bearing Restrictions: No LLE Weight Bearing: Weight bearing as tolerated    Pertinent Vitals/Pain 12/25/12 Time            08:59 09:10 09:17 09:31  BP 123/85 134/100 129/106 124/91( c/o dizziness and feeling overheated  Pt position  Lying sitting standing Walking.        Mobility  Bed Mobility Bed Mobility: Supine to Sit;Sitting - Scoot to Edge of Bed Supine to Sit: 7: Independent;HOB flat Sitting - Scoot to Edge of Bed: 7: Independent Transfers Transfers: Sit to Stand;Stand to Sit Sit to Stand: 6: Modified independent (Device/Increase time) Stand to Sit: 6: Modified independent (Device/Increase time) Stand Pivot Transfers: 6: Modified independent (Device/Increase time) Details for Transfer Assistance: demonstrating safe technique.    Ambulation/Gait Ambulation/Gait Assistance: 5: Supervision Assistive device: Rolling walker Ambulation/Gait Assistance Details: Repeated verbal cues for decreased dependence on UEs.  pt c/o feeling "overheated" while ambulating.    Gait Pattern: Step-to pattern;Antalgic Stairs: No Wheelchair Mobility Wheelchair Mobility: No     PT Diagnosis:    PT Problem List:   PT Treatment Interventions:     PT Goals Acute Rehab PT Goals PT Goal Formulation: With patient Time For Goal Achievement: 12/30/12 Potential to Achieve Goals: Good Pt will go Supine/Side to Sit: Independently;with HOB 0 degrees PT Goal: Supine/Side to Sit - Progress: Met Pt will go Sit to Supine/Side: Independently;with HOB 0 degrees PT Goal: Sit to Supine/Side - Progress: Met Pt will go Sit to Stand: with supervision PT Goal: Sit to Stand - Progress: Met Pt will go Stand to Sit: with modified independence PT Goal: Stand to Sit - Progress: Met Pt will Ambulate: >150 feet;with modified independence;with least restrictive assistive device PT Goal: Ambulate - Progress: Progressing toward goal Pt will Go Up / Down Stairs: 3-5 stairs;Flight;with supervision;with least restrictive assistive device Pt will Perform Home Exercise Program: with supervision, verbal cues required/provided  Visit Information  Last PT Received On: 12/25/12    Subjective Data  Subjective: I feel a lot better today Patient Stated Goal: Walk without pain.    Cognition  Overall Cognitive Status: Appears within functional limits for tasks assessed/performed Arousal/Alertness: Awake/alert Orientation Level: Appears intact for tasks assessed Behavior During Session: [facility] for tasks performed    Balance  Balance Balance Assessed: No  End of Session PT - End of Session Equipment Utilized During Treatment: Gait belt;Left knee immobilizer Activity Tolerance: Other (comment) (c/o dizziness while  ambulating   ) Patient left: in chair;with call  bell/phone within reach Nurse Communication: Mobility status   GP     [name] 12/25/2012, 11:31 AM Theron Arista L. Meilech Virts DPT [phone]

## 2012-12-25 NOTE — Progress Notes (Signed)
Physical Therapy Treatment Patient Details Name: Elaine Wright MRN: [id number] DOB: [birth year] Today's Date: 12/25/2012 Time: 1100-1131 PT Time Calculation (min): 31 min  PT Assessment / Plan / Recommendation Comments on Treatment Session  Pt has met most goals will review HEP this afternoon.      Follow Up Recommendations  Home health PT;Supervision - Intermittent     Does the patient have the potential to tolerate intense rehabilitation     Barriers to Discharge        Equipment Recommendations  None recommended by PT    Recommendations for Other Services    Frequency 7X/week   Plan Discharge plan remains appropriate;Frequency remains appropriate    Precautions / Restrictions Precautions Precautions: Knee Restrictions Weight Bearing Restrictions: No LLE Weight Bearing: Weight bearing as tolerated   Pertinent Vitals/Pain Pain 4/10     Mobility  Bed Mobility Bed Mobility: Not assessed Transfers Transfers: Not assessed Ambulation/Gait Ambulation/Gait Assistance: 5: Supervision Ambulation Distance (Feet): 115 Feet Assistive device: Rolling walker Stairs: Yes Stairs Assistance: 5: Supervision Stairs Assistance Details (indicate cue type and reason): Instructed pt and spouse in proper technique.   Stair Management Technique: One rail Right;Forwards;With walker;Backwards Number of Stairs: 4  (4 x 2) Wheelchair Mobility Wheelchair Mobility: No    Exercises Total Joint Exercises Ankle Circles/Pumps: Both;10 reps Quad Sets: 5 reps;Left Knee Flexion: 5 reps;Left   PT Diagnosis:    PT Problem List:   PT Treatment Interventions:     PT Goals Acute Rehab PT Goals PT Goal Formulation: With patient Time For Goal Achievement: 12/30/12 Potential to Achieve Goals: Good Pt will go Supine/Side to Sit: Independently;with HOB 0 degrees PT Goal: Supine/Side to Sit - Progress: Met Pt will go Sit to Supine/Side: Independently;with HOB 0 degrees PT Goal: Sit to  Supine/Side - Progress: Met Pt will go Sit to Stand: with supervision PT Goal: Sit to Stand - Progress: Met Pt will go Stand to Sit: with modified independence PT Goal: Stand to Sit - Progress: Met Pt will Ambulate: >150 feet;with modified independence;with least restrictive assistive device PT Goal: Ambulate - Progress: Partly met Pt will Go Up / Down Stairs: 3-5 stairs;Flight;with supervision;with least restrictive assistive device PT Goal: Up/Down Stairs - Progress: Met Pt will Perform Home Exercise Program: with supervision, verbal cues required/provided  Visit Information  Last PT Received On: 12/25/12    Subjective Data      Cognition  Overall Cognitive Status: Appears within functional limits for tasks assessed/performed Arousal/Alertness: Awake/alert Orientation Level: Appears intact for tasks assessed Behavior During Session: [facility] for tasks performed    Balance  Balance Balance Assessed: No  End of Session PT - End of Session Equipment Utilized During Treatment: Gait belt;Left knee immobilizer Activity Tolerance: Other (comment) Patient left: in chair;with call bell/phone within reach Nurse Communication: Mobility status   GP     Elaine Wright 12/25/2012, 2:48 PM Elaine Wright DPT [phone]

## 2013-01-18 NOTE — Discharge Summary (Signed)
  ABBREVIATED DISCHARGE SUMMARY      DATE OF HOSPITALIZATION:  23 Dec 2012  REASON FOR HOSPITALIZATION:  57 yo female with hx of end stage djd left knee and chronic pain.  Failed conservative treatment.     SIGNIFICANT FINDINGS:  DJD  OPERATION:  Left total knee replacement  FINAL DIAGNOSIS:  djd   SECONDARY DIAGNOSIS: none  CONSULTANTS:  none  DISCHARGE CONDITION:  STABLE  DISCHARGED TO:  HOME

## 2014-04-15 ENCOUNTER — Ambulatory Visit: Payer: Self-pay | Admitting: Podiatry

## 2014-05-17 ENCOUNTER — Encounter: Payer: Self-pay | Admitting: Family Medicine

## 2014-05-17 ENCOUNTER — Ambulatory Visit (INDEPENDENT_AMBULATORY_CARE_PROVIDER_SITE_OTHER): Payer: 59 | Admitting: Family Medicine

## 2014-05-17 VITALS — BP 131/87 | Ht 61.0 in | Wt 135.0 lb

## 2014-05-17 DIAGNOSIS — T8489XA Other specified complication of internal orthopedic prosthetic devices, implants and grafts, initial encounter: Secondary | ICD-10-CM

## 2014-05-17 DIAGNOSIS — Z96659 Presence of unspecified artificial knee joint: Secondary | ICD-10-CM

## 2014-05-17 DIAGNOSIS — T8484XA Pain due to internal orthopedic prosthetic devices, implants and grafts, initial encounter: Secondary | ICD-10-CM

## 2014-05-17 MED ORDER — NAPROXEN 500 MG PO TABS: 500.0000 mg | ORAL_TABLET | Freq: Two times a day (BID) | ORAL | Status: DC

## 2014-05-17 NOTE — Progress Notes (Signed)
CC: Left knee pain after total knee replacement HPI: Patient is a very pleasant 58 year old female who presents for evaluation of left knee pain after knee replacement performed by Dr. Noemi Chapel in January 2014. She states that this pain has been present ever since surgery. She notes pain over the lateral aspect of the knee. It bothers her when she tries to get up in the middle of the night or when she tries to get up after prolonged sitting. Her TENS unit does seem to help her. She denies any radicular pain or numbness. She states that she last saw Dr. Noemi Chapel about 2 months ago and he reassured her that her x-rays looked excellent and thought her pain might be coming from her back. She states that she is work very hard on her range of motion but still doesn't feel like she can bend it very far. She gets a pinching sensation if she tries to bend too far. She has to go down one step at a time due to pain. She denies any knee swelling.  ROS: As above in the HPI. All other systems are stable or negative.  OBJECTIVE: APPEARANCE:  Patient in no acute distress.The patient appeared well nourished and normally developed. HEENT: No scleral icterus. Conjunctiva non-injected Resp: Non labored Skin: No rash MSK:  Left Knee - Inspection normal with no erythema or effusion or obvious bony abnormalities.  - Palpation shows significant tenderness over the lateral and posterior lateral joint line and less so over the fibular head. There is a soft tissue mass type structure on palpation at the posterior lateral corner it is very tender - ROM limited to 0-100. - Strength 5/5 in flexion and extension. - Stable on ligament testing - Neurovascularly intact  MSK [place]: Limited ultrasound of the left knee was performed today. There is no significant fluid accumulation in the suprapatellar pouch. Ultrasound is somewhat challenging given disturbed anatomy for knee replacement. At the posterior lateral corner there appears  to be a spur is visualized just off of where the femoral condyle hardware would be. Surrounding this spur is hypoechoic change that may be consistent with a pseudocyst.   ASSESSMENT: #1. Persistent left knee pain and limited range of motion in flexion after total knee replacement almost 18 months ago. There is evidence of possible spur remaining as well as pseudo-bursa formation off of the lateral femoral condyle.   PLAN: We will place her in a knee compression sleeve to wear while she is on her feet. 2 week trial of scheduled NSAID and then as needed. Continue gentle work on knee flexion. Followup in one month for recheck. May need to consider injection in the future if this does not improve. However I am somewhat cautious to consider injection given that she has artificial knee.

## 2014-05-17 NOTE — Patient Instructions (Signed)
Compression sleeve while on your feet.  Naproxen 2x per day for 14 days then as needed Gentle range of motion  Followup 1 month

## 2014-06-20 ENCOUNTER — Ambulatory Visit (INDEPENDENT_AMBULATORY_CARE_PROVIDER_SITE_OTHER): Payer: 59 | Admitting: Sports Medicine

## 2014-06-20 ENCOUNTER — Encounter: Payer: Self-pay | Admitting: Sports Medicine

## 2014-06-20 VITALS — BP 145/95 | Ht 61.0 in | Wt 135.0 lb

## 2014-06-20 DIAGNOSIS — Z5189 Encounter for other specified aftercare: Secondary | ICD-10-CM

## 2014-06-20 DIAGNOSIS — T8484XD Pain due to internal orthopedic prosthetic devices, implants and grafts, subsequent encounter: Secondary | ICD-10-CM

## 2014-06-20 DIAGNOSIS — Z96659 Presence of unspecified artificial knee joint: Secondary | ICD-10-CM

## 2014-06-20 DIAGNOSIS — G8918 Other acute postprocedural pain: Secondary | ICD-10-CM

## 2014-06-20 DIAGNOSIS — T8489XA Other specified complication of internal orthopedic prosthetic devices, implants and grafts, initial encounter: Secondary | ICD-10-CM

## 2014-06-20 NOTE — Patient Instructions (Signed)
Pt has to call Dr Penelope Coop office and get their TKR notes sent over to Dr Maretta Los office before an appt can be made at [facility].  Once notes are viewed by Dr. Maureen Ralphs, they will determine when pt is to be seen and will contact pt with that info. Pt is aware.

## 2014-06-20 NOTE — Progress Notes (Signed)
   Subjective:    Patient ID: Elaine Wright, female    DOB: 04-02-1956, 58 y.o.   MRN: [id number]  HPI Patient comes in today for followup on a painful left total knee replacement. Please see the previous office note by Dr.Voss for details regarding history. In short, she is status post left total knee arthroplasty done by Dr.Murphy in January of 2014 (this was mistakenly dictated as Dr. Noemi Chapel in the previous note). She has had persistent lateral knee pain since her surgery. Postoperative x-rays at [facility] were negative per her report. Nonetheless she still gets excruciating lateral knee pain with activity. She states that she is able to walk straight ahead without too much difficulty but any sort of twisting places her in extreme discomfort. She denies any associated numbness or tingling. She denies pain proximally in her hip or more distally in her calf or ankle. She was given a knee compression sleeve by Dr. Maryln Gottron which has been of limited benefit. She has not noticed any swelling. She notes an inability to completely flex the knee. MSK ultrasound done on her last office visit showed some findings consistent with a possible small pseudocyst off of the distal femoral condyle.    Review of Systems As above    Objective:   Physical Exam Well-developed, well-nourished. No acute distress. Awake alert and oriented x3. Vital signs reviewed.  Left knee: Range of motion is 0-90 both actively and passively. No effusion. Well-healed surgical incision. No erythema. She is tender to palpation over the lateral knee. Good stability. Questionable soft tissue mass along the posterior lateral joint line. Neurovascularly intact distally. Walking with a slight limp.       Assessment & Plan:  Left knee pain status post total knee arthroplasty in January 2014  Although her previous MSK ultrasound showed findings consistent with a possible small pseudocyst I'm not convinced that all of her  symptomatology is arising from this small structure. I think the best course of action would be for her to seek another opinion from [facility]. Patient is in agreement with this plan. I'll defer further workup and treatment for this problem to the discretion of Dr Wynelle Link.

## 2016-02-29 ENCOUNTER — Encounter: Payer: Self-pay | Admitting: Podiatry

## 2016-02-29 ENCOUNTER — Ambulatory Visit (INDEPENDENT_AMBULATORY_CARE_PROVIDER_SITE_OTHER): Payer: Commercial Managed Care - HMO

## 2016-02-29 ENCOUNTER — Ambulatory Visit (INDEPENDENT_AMBULATORY_CARE_PROVIDER_SITE_OTHER): Payer: Commercial Managed Care - HMO | Admitting: Podiatry

## 2016-02-29 VITALS — BP 133/93 | HR 78 | Resp 16 | Ht 61.0 in | Wt 135.0 lb

## 2016-02-29 DIAGNOSIS — M79671 Pain in right foot: Secondary | ICD-10-CM

## 2016-02-29 DIAGNOSIS — M722 Plantar fascial fibromatosis: Secondary | ICD-10-CM

## 2016-02-29 DIAGNOSIS — M21619 Bunion of unspecified foot: Secondary | ICD-10-CM | POA: Diagnosis not present

## 2016-02-29 DIAGNOSIS — M779 Enthesopathy, unspecified: Secondary | ICD-10-CM | POA: Diagnosis not present

## 2016-02-29 MED ORDER — TRIAMCINOLONE ACETONIDE 10 MG/ML IJ SUSP
10.0000 mg | Freq: Once | INTRAMUSCULAR | Status: AC
  Administered 2016-02-29: 10 mg

## 2016-02-29 NOTE — Progress Notes (Signed)
Subjective:     Patient ID: Elaine Wright, female   DOB: 06/05/1956, 60 y.o.   MRN: [id number]  HPI patient states I've had a lot of pain on top of my right foot and also I have a bunion right I have flatfeet and my feet get tired if I walk too much   Review of Systems  All other systems reviewed and are negative.      Objective:   Physical Exam  Constitutional: She is oriented to person, place, and time.  Cardiovascular: Intact distal pulses.   Musculoskeletal: Normal range of motion.  Neurological: She is oriented to person, place, and time.  Skin: Skin is warm.  Nursing note and vitals reviewed.  neurovascular status found to be intact muscle strength adequate range of motion within normal limits with patient having exquisite discomfort on the dorsum of the right foot within the extensor complex and also noted to have a hyperostosis medial aspect first metatarsal head right and flatfoot deformity with inflammation of the plantar tissue digital perfusion well oriented 3     Assessment:     Dorsal tendinitis of the right mid foot along with depression of the arch with tendinitis symptoms and structural bunion deformity    Plan:     H&P and x-rays reviewed of the right foot. Discussed structural bunion and treatment options as needed but today were to focus on the inflamed area and also long-term orthotic treatment. I went ahead today and I injected the dorsal tendon complex right 3 mg Kenalog 5 mg Xylocaine and I scanned for a Berkley type orthotic  for complete plantar support at this time. Patient will be seen back to recheck  X-rays indicate there is structural bunion deformity right and there is depression of the arch noted with no indications of fracture

## 2016-02-29 NOTE — Progress Notes (Signed)
   Subjective:    Patient ID: Elaine Wright, female    DOB: 07-23-1956, 60 y.o.   MRN: [id number]  HPI Patient presents with foot pain in their right foot; dorsal & plantar forefoot; swollen. Pt stated, "feels like has a knot on top of the foot"; x2 weeks.   Review of Systems  All other systems reviewed and are negative.      Objective:   Physical Exam        Assessment & Plan:

## 2016-03-27 ENCOUNTER — Ambulatory Visit: Payer: Commercial Managed Care - HMO | Admitting: *Deleted

## 2016-03-27 DIAGNOSIS — M779 Enthesopathy, unspecified: Secondary | ICD-10-CM

## 2016-03-27 NOTE — Progress Notes (Signed)
Patient ID: Elaine Wright, female   DOB: 02-Apr-1956, 60 y.o.   MRN: [id number] Patient presents for orthotic pick up.  Verbal and written break in and wear instructions given.  Patient will follow up in 4 weeks if symptoms worsen or fail to improve.

## 2016-03-27 NOTE — Patient Instructions (Signed)

## 2016-06-19 ENCOUNTER — Encounter: Payer: Self-pay | Admitting: Podiatry

## 2016-06-19 ENCOUNTER — Ambulatory Visit (INDEPENDENT_AMBULATORY_CARE_PROVIDER_SITE_OTHER): Payer: Commercial Managed Care - HMO | Admitting: Podiatry

## 2016-06-19 ENCOUNTER — Ambulatory Visit (INDEPENDENT_AMBULATORY_CARE_PROVIDER_SITE_OTHER): Payer: Commercial Managed Care - HMO

## 2016-06-19 VITALS — BP 136/82 | HR 66 | Resp 16

## 2016-06-19 DIAGNOSIS — M779 Enthesopathy, unspecified: Secondary | ICD-10-CM

## 2016-06-19 DIAGNOSIS — M21619 Bunion of unspecified foot: Secondary | ICD-10-CM | POA: Diagnosis not present

## 2016-06-19 MED ORDER — NONFORMULARY OR COMPOUNDED ITEM: 180.0000 g | Freq: Four times a day (QID) | Status: DC

## 2016-06-19 MED ORDER — TRIAMCINOLONE ACETONIDE 10 MG/ML IJ SUSP
10.0000 mg | Freq: Once | INTRAMUSCULAR | Status: AC
  Administered 2016-06-19: 10 mg

## 2016-06-19 NOTE — Progress Notes (Signed)
Subjective:     Patient ID: Elaine Wright, female   DOB: 29-Dec-1955, 60 y.o.   MRN: [id number]  HPI patient states the top of my right foot has started to hurt again and I know my structural bunion on the left is worse than the one on the right. Patient states this is been ongoing and gradually getting worse   Review of Systems     Objective:   Physical Exam Neurovascular status intact muscle strength adequate with an formation pain around the dorsum of the right foot within the chopart and Lisfranc's joints with fluid buildup around the tendon complex and also noted to have structural bunion bilateral with the left slightly worse than the right    Assessment:     Doing well overall but reoccurrence of dorsal pain right with structural bunion getting worse left    Plan:     H&P and x-rays of left foot reviewed. Today I went ahead and injected the dorsal complex right 3 mg Kenalog 5 mg Xylocaine and we will prescribe a compound cream to try to reduce inflammation. For the left one we did x-ray and I reviewed and will need surgery some day which she understands  X-ray report indicate significant elevation intermetatarsal angle of approximate 16 with tibial sesamoidal shift position of 5 deviation the hallux against the second toe yellow off

## 2016-07-04 ENCOUNTER — Ambulatory Visit: Payer: Commercial Managed Care - HMO | Admitting: Podiatry

## 2016-07-05 ENCOUNTER — Ambulatory Visit (INDEPENDENT_AMBULATORY_CARE_PROVIDER_SITE_OTHER): Payer: Commercial Managed Care - HMO

## 2016-07-05 ENCOUNTER — Ambulatory Visit (INDEPENDENT_AMBULATORY_CARE_PROVIDER_SITE_OTHER): Payer: Commercial Managed Care - HMO | Admitting: Podiatry

## 2016-07-05 ENCOUNTER — Encounter: Payer: Self-pay | Admitting: Podiatry

## 2016-07-05 VITALS — BP 140/90 | HR 63 | Resp 16

## 2016-07-05 DIAGNOSIS — M79671 Pain in right foot: Secondary | ICD-10-CM | POA: Diagnosis not present

## 2016-07-05 DIAGNOSIS — M779 Enthesopathy, unspecified: Secondary | ICD-10-CM

## 2016-07-05 MED ORDER — TRIAMCINOLONE ACETONIDE 10 MG/ML IJ SUSP
10.0000 mg | Freq: Once | INTRAMUSCULAR | Status: AC
  Administered 2016-07-05: 10 mg

## 2016-07-10 NOTE — Progress Notes (Signed)
Subjective:     Patient ID: Elaine Wright, female   DOB: 01-08-1956, 60 y.o.   MRN: [id number]  Foot Pain   Patient presents stating I'm having pain on the outside of my right foot and it seems to be a different area than were I used to get this   Review of Systems     Objective:   Physical Exam Neurovascular status intact muscle strength adequate range of motion within normal limits with discomfort in the lateral side of the right foot around the ankle with localized inflammation and tenderness-like symptoms    Assessment:     Continued inflammatory tendinitis right which has moved location    Plan:     H&P conditions reviewed and careful injection administered to the outside of the right foot and ankle 3 mg Kenalog 5 mg Xylocaine and advised on reduced activity and reappoint in 4 weeks

## 2016-08-02 ENCOUNTER — Ambulatory Visit: Payer: Commercial Managed Care - HMO | Admitting: Podiatry

## 2016-12-24 ENCOUNTER — Other Ambulatory Visit: Payer: Self-pay | Admitting: Obstetrics and Gynecology

## 2016-12-24 DIAGNOSIS — Z124 Encounter for screening for malignant neoplasm of cervix: Secondary | ICD-10-CM | POA: Diagnosis not present

## 2016-12-24 DIAGNOSIS — Z1231 Encounter for screening mammogram for malignant neoplasm of breast: Secondary | ICD-10-CM

## 2016-12-24 DIAGNOSIS — Z01419 Encounter for gynecological examination (general) (routine) without abnormal findings: Secondary | ICD-10-CM | POA: Diagnosis not present

## 2017-01-16 DIAGNOSIS — L820 Inflamed seborrheic keratosis: Secondary | ICD-10-CM | POA: Diagnosis not present

## 2017-01-16 DIAGNOSIS — L812 Freckles: Secondary | ICD-10-CM | POA: Diagnosis not present

## 2017-01-16 DIAGNOSIS — Z1283 Encounter for screening for malignant neoplasm of skin: Secondary | ICD-10-CM | POA: Diagnosis not present

## 2017-01-16 DIAGNOSIS — L821 Other seborrheic keratosis: Secondary | ICD-10-CM | POA: Diagnosis not present

## 2017-01-27 ENCOUNTER — Ambulatory Visit
Admission: RE | Admit: 2017-01-27 | Discharge: 2017-01-27 | Disposition: A | Discharge status: Home / Self Care | Payer: Commercial Managed Care - HMO | Source: Ambulatory Visit | Attending: Obstetrics and Gynecology | Admitting: Obstetrics and Gynecology

## 2017-01-27 DIAGNOSIS — Z1231 Encounter for screening mammogram for malignant neoplasm of breast: Secondary | ICD-10-CM | POA: Diagnosis not present

## 2017-01-28 ENCOUNTER — Other Ambulatory Visit: Payer: Self-pay | Admitting: *Deleted

## 2017-01-28 ENCOUNTER — Inpatient Hospital Stay
Admission: RE | Admit: 2017-01-28 | Discharge: 2017-01-28 | Disposition: A | Discharge status: Home / Self Care | Payer: Self-pay | Source: Ambulatory Visit | Attending: *Deleted | Admitting: *Deleted

## 2017-01-28 DIAGNOSIS — Z9289 Personal history of other medical treatment: Secondary | ICD-10-CM

## 2017-04-07 DIAGNOSIS — Z961 Presence of intraocular lens: Secondary | ICD-10-CM | POA: Diagnosis not present

## 2017-05-09 DIAGNOSIS — M85859 Other specified disorders of bone density and structure, unspecified thigh: Secondary | ICD-10-CM | POA: Diagnosis not present

## 2017-05-09 DIAGNOSIS — M1710 Unilateral primary osteoarthritis, unspecified knee: Secondary | ICD-10-CM | POA: Diagnosis not present

## 2017-05-09 DIAGNOSIS — Z0000 Encounter for general adult medical examination without abnormal findings: Secondary | ICD-10-CM | POA: Diagnosis not present

## 2017-05-09 DIAGNOSIS — E119 Type 2 diabetes mellitus without complications: Secondary | ICD-10-CM | POA: Diagnosis not present

## 2017-05-16 DIAGNOSIS — E876 Hypokalemia: Secondary | ICD-10-CM | POA: Diagnosis not present

## 2017-09-08 DIAGNOSIS — Z1211 Encounter for screening for malignant neoplasm of colon: Secondary | ICD-10-CM | POA: Diagnosis not present

## 2017-10-14 DIAGNOSIS — Z1211 Encounter for screening for malignant neoplasm of colon: Secondary | ICD-10-CM | POA: Diagnosis not present

## 2017-10-14 DIAGNOSIS — K640 First degree hemorrhoids: Secondary | ICD-10-CM | POA: Diagnosis not present

## 2017-10-14 DIAGNOSIS — K648 Other hemorrhoids: Secondary | ICD-10-CM | POA: Diagnosis not present

## 2017-10-14 DIAGNOSIS — K5730 Diverticulosis of large intestine without perforation or abscess without bleeding: Secondary | ICD-10-CM | POA: Diagnosis not present

## 2017-11-10 DIAGNOSIS — R1084 Generalized abdominal pain: Secondary | ICD-10-CM | POA: Diagnosis not present

## 2017-11-10 DIAGNOSIS — Z23 Encounter for immunization: Secondary | ICD-10-CM | POA: Diagnosis not present

## 2017-11-10 DIAGNOSIS — E119 Type 2 diabetes mellitus without complications: Secondary | ICD-10-CM | POA: Diagnosis not present

## 2017-11-12 DIAGNOSIS — E119 Type 2 diabetes mellitus without complications: Secondary | ICD-10-CM | POA: Diagnosis not present

## 2017-11-12 DIAGNOSIS — R1084 Generalized abdominal pain: Secondary | ICD-10-CM | POA: Diagnosis not present

## 2018-01-21 DIAGNOSIS — L570 Actinic keratosis: Secondary | ICD-10-CM | POA: Diagnosis not present

## 2018-01-21 DIAGNOSIS — L719 Rosacea, unspecified: Secondary | ICD-10-CM | POA: Diagnosis not present

## 2018-01-21 DIAGNOSIS — Z1283 Encounter for screening for malignant neoplasm of skin: Secondary | ICD-10-CM | POA: Diagnosis not present

## 2018-01-21 DIAGNOSIS — D225 Melanocytic nevi of trunk: Secondary | ICD-10-CM | POA: Diagnosis not present

## 2018-01-30 DIAGNOSIS — H6121 Impacted cerumen, right ear: Secondary | ICD-10-CM | POA: Diagnosis not present

## 2018-02-03 DIAGNOSIS — Z124 Encounter for screening for malignant neoplasm of cervix: Secondary | ICD-10-CM | POA: Diagnosis not present

## 2018-02-03 DIAGNOSIS — Z01419 Encounter for gynecological examination (general) (routine) without abnormal findings: Secondary | ICD-10-CM | POA: Diagnosis not present

## 2018-02-11 ENCOUNTER — Other Ambulatory Visit: Payer: Self-pay | Admitting: Obstetrics and Gynecology

## 2018-02-11 DIAGNOSIS — Z13820 Encounter for screening for osteoporosis: Secondary | ICD-10-CM

## 2018-02-11 DIAGNOSIS — Z1231 Encounter for screening mammogram for malignant neoplasm of breast: Secondary | ICD-10-CM

## 2018-02-26 DIAGNOSIS — J069 Acute upper respiratory infection, unspecified: Secondary | ICD-10-CM | POA: Diagnosis not present

## 2018-04-06 DIAGNOSIS — N10 Acute tubulo-interstitial nephritis: Secondary | ICD-10-CM | POA: Diagnosis not present

## 2018-04-06 DIAGNOSIS — R011 Cardiac murmur, unspecified: Secondary | ICD-10-CM | POA: Diagnosis not present

## 2018-04-09 DIAGNOSIS — Z961 Presence of intraocular lens: Secondary | ICD-10-CM | POA: Diagnosis not present

## 2018-04-15 DIAGNOSIS — R1032 Left lower quadrant pain: Secondary | ICD-10-CM | POA: Diagnosis not present

## 2018-04-15 DIAGNOSIS — E119 Type 2 diabetes mellitus without complications: Secondary | ICD-10-CM | POA: Diagnosis not present

## 2018-04-23 ENCOUNTER — Ambulatory Visit
Admission: RE | Admit: 2018-04-23 | Discharge: 2018-04-23 | Disposition: A | Discharge status: Home / Self Care | Payer: 59 | Source: Ambulatory Visit | Attending: Obstetrics and Gynecology | Admitting: Obstetrics and Gynecology

## 2018-04-23 DIAGNOSIS — M810 Age-related osteoporosis without current pathological fracture: Secondary | ICD-10-CM | POA: Insufficient documentation

## 2018-04-23 DIAGNOSIS — Z780 Asymptomatic menopausal state: Secondary | ICD-10-CM | POA: Diagnosis not present

## 2018-04-23 DIAGNOSIS — Z1231 Encounter for screening mammogram for malignant neoplasm of breast: Secondary | ICD-10-CM | POA: Diagnosis present

## 2018-04-23 DIAGNOSIS — Z13820 Encounter for screening for osteoporosis: Secondary | ICD-10-CM | POA: Insufficient documentation

## 2018-05-29 DIAGNOSIS — E119 Type 2 diabetes mellitus without complications: Secondary | ICD-10-CM | POA: Diagnosis not present

## 2018-05-29 DIAGNOSIS — M1710 Unilateral primary osteoarthritis, unspecified knee: Secondary | ICD-10-CM | POA: Diagnosis not present

## 2018-05-29 DIAGNOSIS — Z0000 Encounter for general adult medical examination without abnormal findings: Secondary | ICD-10-CM | POA: Diagnosis not present

## 2018-07-24 DIAGNOSIS — M9904 Segmental and somatic dysfunction of sacral region: Secondary | ICD-10-CM | POA: Diagnosis not present

## 2018-07-24 DIAGNOSIS — M461 Sacroiliitis, not elsewhere classified: Secondary | ICD-10-CM | POA: Diagnosis not present

## 2018-07-24 DIAGNOSIS — M62830 Muscle spasm of back: Secondary | ICD-10-CM | POA: Diagnosis not present

## 2018-07-28 DIAGNOSIS — M5412 Radiculopathy, cervical region: Secondary | ICD-10-CM | POA: Diagnosis not present

## 2018-07-28 DIAGNOSIS — M546 Pain in thoracic spine: Secondary | ICD-10-CM | POA: Diagnosis not present

## 2018-07-30 DIAGNOSIS — M542 Cervicalgia: Secondary | ICD-10-CM | POA: Diagnosis not present

## 2018-08-04 DIAGNOSIS — M5412 Radiculopathy, cervical region: Secondary | ICD-10-CM | POA: Diagnosis not present

## 2018-08-04 DIAGNOSIS — M546 Pain in thoracic spine: Secondary | ICD-10-CM | POA: Diagnosis not present

## 2018-11-02 DIAGNOSIS — Z23 Encounter for immunization: Secondary | ICD-10-CM | POA: Diagnosis not present

## 2018-11-26 DIAGNOSIS — E119 Type 2 diabetes mellitus without complications: Secondary | ICD-10-CM | POA: Diagnosis not present

## 2018-11-26 DIAGNOSIS — M1710 Unilateral primary osteoarthritis, unspecified knee: Secondary | ICD-10-CM | POA: Diagnosis not present

## 2018-11-30 DIAGNOSIS — E119 Type 2 diabetes mellitus without complications: Secondary | ICD-10-CM | POA: Diagnosis not present

## 2018-11-30 DIAGNOSIS — R5383 Other fatigue: Secondary | ICD-10-CM | POA: Diagnosis not present

## 2019-01-23 DIAGNOSIS — G4733 Obstructive sleep apnea (adult) (pediatric): Secondary | ICD-10-CM | POA: Diagnosis not present

## 2019-07-22 ENCOUNTER — Other Ambulatory Visit: Payer: Self-pay | Admitting: Family Medicine

## 2019-07-22 DIAGNOSIS — Z1231 Encounter for screening mammogram for malignant neoplasm of breast: Secondary | ICD-10-CM

## 2019-07-23 ENCOUNTER — Other Ambulatory Visit: Payer: Self-pay

## 2019-07-23 ENCOUNTER — Ambulatory Visit
Admission: RE | Admit: 2019-07-23 | Discharge: 2019-07-23 | Disposition: A | Discharge status: Home / Self Care | Payer: 59 | Source: Ambulatory Visit | Attending: Family Medicine | Admitting: Family Medicine

## 2019-07-23 DIAGNOSIS — Z1231 Encounter for screening mammogram for malignant neoplasm of breast: Secondary | ICD-10-CM

## 2020-01-18 ENCOUNTER — Telehealth: Payer: Self-pay | Admitting: *Deleted

## 2020-01-18 NOTE — Telephone Encounter (Signed)
Contacted patient regarding lung screening referral and discussed smoking history. Patient reports most ever smoked was 1/2 pack per day for 38 years. Explained to patient that current eligibility requirements require at least a 30 pack year history. Patient verbalizes understanding.

## 2020-02-29 ENCOUNTER — Other Ambulatory Visit: Payer: Self-pay | Admitting: Certified Nurse Midwife

## 2020-02-29 DIAGNOSIS — Z1231 Encounter for screening mammogram for malignant neoplasm of breast: Secondary | ICD-10-CM

## 2020-03-03 ENCOUNTER — Ambulatory Visit: Payer: 59 | Attending: Internal Medicine

## 2020-03-03 DIAGNOSIS — Z23 Encounter for immunization: Secondary | ICD-10-CM

## 2020-03-03 NOTE — Progress Notes (Signed)
   Covid-19 Vaccination Clinic  Name:  Elaine Wright    MRN: [id number] DOB: Feb 10, 1956  03/03/2020  Elaine Wright was observed post Covid-19 immunization for 15 minutes without incident. She was provided with Vaccine Information Sheet and instruction to access the V-Safe system.   Elaine Wright was instructed to call 911 with any severe reactions post vaccine: Marland Kitchen Difficulty breathing  . Swelling of face and throat  . A fast heartbeat  . A bad rash all over body  . Dizziness and weakness   Immunizations Administered    Name Date Dose VIS Date Route   Moderna COVID-19 Vaccine 03/03/2020 11:39 AM 0.5 mL 11/16/2019 Intramuscular   Manufacturer: Moderna   Lot: [id number]   [id number]

## 2020-03-22 ENCOUNTER — Other Ambulatory Visit: Payer: Self-pay | Admitting: Obstetrics and Gynecology

## 2020-03-28 NOTE — H&P (Signed)
Elaine Wright is a 64 y.o. female here for L/S assisted cervical trachelectomy and A+P repeair .pt is here for follow up for POP with cervical stump prolapse and Posterior rectocele grade 2 .  + sexually active , no difficulties with BM or urination   She was using a pessary with was causing pain and ulceration   not sure if she had her tubes and ovaries removed  At her [facility] .  U/S failed to see tubes and ovaries  Past Medical History:  has a past medical history of Depression, History of chicken pox, migraines, Osteoarthritis, Osteopenia, and Type 2 diabetes mellitus (CMS-HCC).  Past Surgical History:  has a past surgical history that includes Tubal ligation (Bilateral, 1980s); Knee arthroscopy (Right, 01/2009); Knee arthroscopy (12/2012); Fascial band release (Right, 12/1999); Colonoscopy (06/15/2007); Cataract extraction (08/2010); Replacement total knee (Left); PARTIAL KNEE REPLACEMENT (Right); Colonoscopy (10/14/2017); and Hysterectomy (01/2006). Family History: family history includes Coronary Artery Disease (Blocked arteries around heart) in her mother; Diabetes type II in her mother; Myocardial Infarction (Heart attack) in her father. Social History:  reports that she has been smoking. She has been smoking about 0.25 packs per day. She has never used smokeless tobacco. She reports previous alcohol use. She reports that she does not use drugs. OB/GYN History:          OB History    Gravida  2   Para  2   Term  2   Preterm      AB      Living  2     SAB      TAB      Ectopic      Molar      Multiple      Live Births  2          Allergies: is allergic to opioids - morphine analogues and morphine. Medications:  Current Outpatient Medications:  .  estradioL (ESTRACE) 0.01 % (0.1 mg/gram) vaginal cream, 1/4 applicator per vagina x 5 nights then 1/4 applicator per vagina as needed, Disp: 30 g, Rfl: 0 .  metFORMIN (GLUCOPHAGE-XR) 500 MG XR tablet, TAKE 1 TABLET BY  MOUTH EVERY DAY FOR 1 WEEK THEN TAKE 2 TABLETS BY MOUTH EVERY DAY, Disp: 60 tablet, Rfl: 5 .  naproxen sodium (ALEVE, ANAPROX) 220 MG tablet, Take 220 mg by mouth 2 (two) times daily as needed.  , Disp: , Rfl:  .  atorvastatin (LIPITOR) 10 MG tablet, Take 1 tablet (10 mg total) by mouth once daily (Patient not taking: Reported on 02/18/2020  ), Disp: 30 tablet, Rfl: 5  Review of Systems: General:                      No fatigue or weight loss Eyes:                           No vision changes Ears:                            No hearing difficulty Respiratory:                No cough or shortness of breath Pulmonary:                  No asthma or shortness of breath Cardiovascular:           No chest pain, palpitations, dyspnea on  exertion Gastrointestinal:          No abdominal bloating, chronic diarrhea, constipations, masses, pain or hematochezia Genitourinary:             No hematuria, dysuria, abnormal vaginal discharge, pelvic pain, Menometrorrhagia Lymphatic:                   No swollen lymph nodes Musculoskeletal:         No muscle weakness Neurologic:                  No extremity weakness, syncope, seizure disorder Psychiatric:                  No history of depression, delusions or suicidal/homicidal ideation    Exam:      Vitals:   03/16/20 1546  BP: 132/84  Pulse: 79    Body mass index is 27.78 kg/m.  WDWN white/ female in NAD   Lungs: CTA  CV : RRR without murmur   Neck:  no thyromegaly Abdomen: soft , no mass, normal active bowel sounds,  non-tender, no rebound tenderness Pelvic: tanner stage 5 ,  External genitalia: vulva /labia no lesions Urethra: no prolapse Vagina: normal physiologic d/c grade 2 cystocele and grade 2 rectocele and cervical stump prolapse  Cervix: no lesions, no cervical motion tenderness   Uterus: absent Adnexa: no mass,  non-tender   Rectovaginal: Impression:   The primary encounter diagnosis was Vaginal and cervical prolapse.  Diagnoses of Rectocele and Cystocele, midline were also pertinent to this visit.    Plan:  After discussion pt has elected to proceed with definitive surgery . L/S cervical trachelectomy and anterior and posterior colporrhaphy .Possible BSO if seen at time of surgery If I cant get her prior OP report . Surgery has been explained in detail and the possibility of overcorrection causing dyspareunia . Possibility of urinary retention or incontinence as well .         No follow-ups on file.  Caroline Sauger, MD

## 2020-04-04 ENCOUNTER — Ambulatory Visit: Payer: 59 | Attending: Internal Medicine

## 2020-04-04 DIAGNOSIS — Z23 Encounter for immunization: Secondary | ICD-10-CM

## 2020-04-04 NOTE — Progress Notes (Signed)
   Covid-19 Vaccination Clinic  Name:  GENEVEVE FORTUNO    MRN: [id number] DOB: 28-Nov-1956  04/04/2020  Ms. Levar was observed post Covid-19 immunization for 15 minutes without incident. She was provided with Vaccine Information Sheet and instruction to access the V-Safe system.   Ms. Weingart was instructed to call 911 with any severe reactions post vaccine: Marland Kitchen Difficulty breathing  . Swelling of face and throat  . A fast heartbeat  . A bad rash all over body  . Dizziness and weakness   Immunizations Administered    Name Date Dose VIS Date Route   Moderna COVID-19 Vaccine 04/04/2020 10:23 AM 0.5 mL 11/2019 Intramuscular   Manufacturer: Moderna   Lot: [id number]   [id number]

## 2020-04-05 ENCOUNTER — Encounter
Admission: RE | Admit: 2020-04-05 | Discharge: 2020-04-05 | Disposition: A | Discharge status: Home / Self Care | Payer: 59 | Source: Ambulatory Visit | Attending: Obstetrics and Gynecology | Admitting: Obstetrics and Gynecology

## 2020-04-05 ENCOUNTER — Other Ambulatory Visit: Payer: Self-pay

## 2020-04-05 HISTORY — DX: Sleep apnea, unspecified: G47.30

## 2020-04-05 HISTORY — DX: Type 2 diabetes mellitus without complications: E11.9

## 2020-04-05 NOTE — Patient Instructions (Addendum)
Your procedure is scheduled on: 04/14/20 Report to [place]. To find out your arrival time please call [phone] between 1PM - 3PM on 04/13/20.  Remember: Instructions that are not followed completely may result in serious medical risk, up to and including death, or upon the discretion of your surgeon and anesthesiologist your surgery may need to be rescheduled.     _X__ 1. Do not eat food after midnight the night before your procedure.                 No gum chewing or hard candies. You may drink clear liquids up to 2 hours                 before you are scheduled to arrive for your surgery- DO not drink clear                 liquids within 2 hours of the start of your surgery.                 Clear Liquids include:  water, apple juice without pulp, clear carbohydrate                 drink such as Clearfast or Gatorade, Black Coffee or Tea (Do not add                 anything to coffee or tea). Diabetics water only  __X__2.  On the morning of surgery brush your teeth with toothpaste and water, you                 may rinse your mouth with mouthwash if you wish.  Do not swallow any              toothpaste of mouthwash.     _X__ 3.  No Alcohol for 24 hours before or after surgery.   _X__ 4.  Do Not Smoke or use e-cigarettes For 24 Hours Prior to Your Surgery.                 Do not use any chewable tobacco products for at least 6 hours prior to                 surgery.  ____  5.  Bring all medications with you on the day of surgery if instructed.   __X__  6.  Notify your doctor if there is any change in your medical condition      (cold, fever, infections).     Do not wear jewelry, make-up, hairpins, clips or nail polish. Do not wear lotions, powders, or perfumes.  Do not shave 48 hours prior to surgery. Men may shave face and neck. Do not bring valuables to the hospital.    [facility] is not responsible for any belongings or  valuables.  Contacts, dentures/partials or body piercings may not be worn into surgery. Bring a case for your contacts, glasses or hearing aids, a denture cup will be supplied. Leave your suitcase in the car. After surgery it may be brought to your room. For patients admitted to the hospital, discharge time is determined by your treatment team.   Patients discharged the day of surgery will not be allowed to drive home.   Please read over the following fact sheets that you were given:   MRSA Information  __X__ Take these medicines the morning of surgery with A SIP OF WATER:  1. NONE  2.   3.   4.  5.  6.  ____ Fleet Enema (as directed)   __X__ Use CHG Soap/SAGE wipes as directed  ____ Use inhalers on the day of surgery  __X__ Stop metformin/Janumet/Farxiga 2 days prior to surgery    ____ Take 1/2 of usual insulin dose the night before surgery. No insulin the morning          of surgery.   ____ Stop Blood Thinners Coumadin/Plavix/Xarelto/Pleta/Pradaxa/Eliquis/Effient/Aspirin  on   Or contact your Surgeon, Cardiologist or Medical Doctor regarding  ability to stop your blood thinners  __X__ Stop Anti-inflammatories 7 days before surgery such as Advil, Ibuprofen, Motrin,  BC or Goodies Powder, Naprosyn, Naproxen, Aleve, Aspirin    __X__ Stop all herbal supplements, fish oil or vitamin E until after surgery.    __X__ Bring C-Pap to the hospital.     THE G2 GATORADE DRINK SHOULD BE FINISHED 2 HOURS PRIOR TO YOUR ARRIVAL THE DAY OF SURGERY  REVIEW THE INCENTIVE SPIROMETER INSTRUCTIONS

## 2020-04-06 ENCOUNTER — Encounter
Admission: RE | Admit: 2020-04-06 | Discharge: 2020-04-06 | Disposition: A | Discharge status: Home / Self Care | Payer: 59 | Source: Ambulatory Visit | Attending: Obstetrics and Gynecology | Admitting: Obstetrics and Gynecology

## 2020-04-06 DIAGNOSIS — Z01818 Encounter for other preprocedural examination: Secondary | ICD-10-CM | POA: Diagnosis not present

## 2020-04-06 DIAGNOSIS — E118 Type 2 diabetes mellitus with unspecified complications: Secondary | ICD-10-CM | POA: Diagnosis not present

## 2020-04-06 LAB — TYPE AND SCREEN
ABO/RH(D): A POS
Antibody Screen: NEGATIVE

## 2020-04-06 LAB — BASIC METABOLIC PANEL
Anion gap: 9 (ref 5–15)
BUN: 19 mg/dL (ref 8–23)
CO2: 29 mmol/L (ref 22–32)
Calcium: 9.1 mg/dL (ref 8.9–10.3)
Chloride: 103 mmol/L (ref 98–111)
Creatinine, Ser: 0.7 mg/dL (ref 0.44–1.00)
GFR calc Af Amer: >60 mL/min (ref >60)
GFR calc non Af Amer: >60 mL/min (ref >60)
Glucose, Bld: 131 mg/dL — ABNORMAL HIGH (ref 70–99)
Potassium: 4.3 mmol/L (ref 3.5–5.1)
Sodium: 141 mmol/L (ref 135–145)

## 2020-04-06 LAB — CBC
HCT: 45.1 % (ref 36.0–46.0)
Hemoglobin: 14.8 g/dL (ref 12.0–15.0)
MCH: 28.7 pg (ref 26.0–34.0)
MCHC: 32.8 g/dL (ref 30.0–36.0)
MCV: 87.6 fL (ref 80.0–100.0)
Platelets: 250 10*3/uL (ref 150–400)
RBC: 5.15 MIL/uL — ABNORMAL HIGH (ref 3.87–5.11)
RDW: 13.8 % (ref 11.5–15.5)
WBC: 8 10*3/uL (ref 4.0–10.5)
nRBC: 0 % (ref 0.0–0.2)

## 2020-04-12 ENCOUNTER — Other Ambulatory Visit
Admission: RE | Admit: 2020-04-12 | Discharge: 2020-04-12 | Disposition: A | Discharge status: Home / Self Care | Payer: 59 | Source: Ambulatory Visit | Attending: Obstetrics and Gynecology | Admitting: Obstetrics and Gynecology

## 2020-04-12 DIAGNOSIS — Z20822 Contact with and (suspected) exposure to covid-19: Secondary | ICD-10-CM | POA: Insufficient documentation

## 2020-04-12 DIAGNOSIS — Z01812 Encounter for preprocedural laboratory examination: Secondary | ICD-10-CM | POA: Insufficient documentation

## 2020-04-12 LAB — SARS CORONAVIRUS 2 (TAT 6-24 HRS): SARS Coronavirus 2: NEGATIVE

## 2020-04-14 ENCOUNTER — Encounter
Admission: RE | Disposition: A | Discharge status: Home / Self Care | Payer: Self-pay | Source: Home / Self Care | Attending: Obstetrics and Gynecology

## 2020-04-14 ENCOUNTER — Ambulatory Visit: Payer: 59 | Admitting: Anesthesiology

## 2020-04-14 ENCOUNTER — Encounter: Payer: Self-pay | Admitting: Obstetrics and Gynecology

## 2020-04-14 ENCOUNTER — Ambulatory Visit
Admission: RE | Admit: 2020-04-14 | Discharge: 2020-04-14 | Disposition: A | Discharge status: Home / Self Care | Payer: 59 | Attending: Obstetrics and Gynecology | Admitting: Obstetrics and Gynecology

## 2020-04-14 ENCOUNTER — Other Ambulatory Visit: Payer: Self-pay

## 2020-04-14 DIAGNOSIS — Z791 Long term (current) use of non-steroidal anti-inflammatories (NSAID): Secondary | ICD-10-CM | POA: Insufficient documentation

## 2020-04-14 DIAGNOSIS — Z79890 Hormone replacement therapy (postmenopausal): Secondary | ICD-10-CM | POA: Insufficient documentation

## 2020-04-14 DIAGNOSIS — Z7984 Long term (current) use of oral hypoglycemic drugs: Secondary | ICD-10-CM | POA: Diagnosis not present

## 2020-04-14 DIAGNOSIS — E119 Type 2 diabetes mellitus without complications: Secondary | ICD-10-CM | POA: Insufficient documentation

## 2020-04-14 DIAGNOSIS — F17210 Nicotine dependence, cigarettes, uncomplicated: Secondary | ICD-10-CM | POA: Insufficient documentation

## 2020-04-14 DIAGNOSIS — N993 Prolapse of vaginal vault after hysterectomy: Secondary | ICD-10-CM | POA: Diagnosis not present

## 2020-04-14 DIAGNOSIS — Z96653 Presence of artificial knee joint, bilateral: Secondary | ICD-10-CM | POA: Insufficient documentation

## 2020-04-14 DIAGNOSIS — Z885 Allergy status to narcotic agent status: Secondary | ICD-10-CM | POA: Diagnosis not present

## 2020-04-14 DIAGNOSIS — Z79899 Other long term (current) drug therapy: Secondary | ICD-10-CM | POA: Diagnosis not present

## 2020-04-14 DIAGNOSIS — G4730 Sleep apnea, unspecified: Secondary | ICD-10-CM | POA: Diagnosis not present

## 2020-04-14 DIAGNOSIS — G43909 Migraine, unspecified, not intractable, without status migrainosus: Secondary | ICD-10-CM | POA: Insufficient documentation

## 2020-04-14 HISTORY — PX: ANTERIOR AND POSTERIOR REPAIR: SHX5121

## 2020-04-14 HISTORY — PX: TRACHELECTOMY: SHX6586

## 2020-04-14 LAB — GLUCOSE, CAPILLARY
Glucose-Capillary: 110 mg/dL — ABNORMAL HIGH (ref 70–99)
Glucose-Capillary: 132 mg/dL — ABNORMAL HIGH (ref 70–99)

## 2020-04-14 LAB — ABO/RH: ABO/RH(D): A POS

## 2020-04-14 SURGERY — TRACHELECTOMY
Anesthesia: General

## 2020-04-14 MED ORDER — FENTANYL CITRATE (PF) 250 MCG/5ML IJ SOLN
INTRAMUSCULAR | Status: AC
  Filled 2020-04-14: qty 5

## 2020-04-14 MED ORDER — MEPERIDINE HCL 50 MG/ML IJ SOLN: 6.2500 mg | INTRAMUSCULAR | Status: DC | PRN

## 2020-04-14 MED ORDER — IBUPROFEN 600 MG PO TABS
ORAL_TABLET | ORAL | Status: AC
  Administered 2020-04-14: 600 mg via ORAL
  Filled 2020-04-14: qty 1

## 2020-04-14 MED ORDER — VASOPRESSIN 20 UNIT/ML IV SOLN
INTRAVENOUS | Status: AC
  Filled 2020-04-14: qty 1

## 2020-04-14 MED ORDER — DEXAMETHASONE SODIUM PHOSPHATE 10 MG/ML IJ SOLN: INTRAMUSCULAR | Status: DC | PRN

## 2020-04-14 MED ORDER — KETOROLAC TROMETHAMINE 30 MG/ML IJ SOLN
INTRAMUSCULAR | Status: DC | PRN
  Administered 2020-04-14: 30 mg via INTRAVENOUS

## 2020-04-14 MED ORDER — FAMOTIDINE 20 MG PO TABS: 20.0000 mg | ORAL_TABLET | Freq: Once | ORAL | Status: AC

## 2020-04-14 MED ORDER — BUPIVACAINE HCL 0.5 % IJ SOLN
INTRAMUSCULAR | Status: DC | PRN
  Administered 2020-04-14: 11 mL

## 2020-04-14 MED ORDER — FENTANYL CITRATE (PF) 100 MCG/2ML IJ SOLN
INTRAMUSCULAR | Status: DC | PRN
  Administered 2020-04-14: 100 ug via INTRAVENOUS
  Administered 2020-04-14: 50 ug via INTRAVENOUS

## 2020-04-14 MED ORDER — LIDOCAINE-EPINEPHRINE 1 %-1:100000 IJ SOLN
INTRAMUSCULAR | Status: AC
  Filled 2020-04-14: qty 1

## 2020-04-14 MED ORDER — ONDANSETRON HCL 4 MG/2ML IJ SOLN
INTRAMUSCULAR | Status: DC | PRN
  Administered 2020-04-14: 4 mg via INTRAVENOUS

## 2020-04-14 MED ORDER — LIDOCAINE HCL (CARDIAC) PF 100 MG/5ML IV SOSY
PREFILLED_SYRINGE | INTRAVENOUS | Status: DC | PRN
  Administered 2020-04-14: 60 mg via INTRAVENOUS

## 2020-04-14 MED ORDER — PROPOFOL 10 MG/ML IV BOLUS
INTRAVENOUS | Status: AC
  Filled 2020-04-14: qty 40

## 2020-04-14 MED ORDER — FENTANYL CITRATE (PF) 100 MCG/2ML IJ SOLN: 25.0000 ug | INTRAMUSCULAR | Status: DC | PRN

## 2020-04-14 MED ORDER — LACTATED RINGERS IV SOLN: INTRAVENOUS | Status: DC | PRN

## 2020-04-14 MED ORDER — PROMETHAZINE HCL 25 MG/ML IJ SOLN: 6.2500 mg | INTRAMUSCULAR | Status: DC | PRN

## 2020-04-14 MED ORDER — BUPIVACAINE HCL (PF) 0.5 % IJ SOLN
INTRAMUSCULAR | Status: AC
  Filled 2020-04-14: qty 30

## 2020-04-14 MED ORDER — FAMOTIDINE 20 MG PO TABS
ORAL_TABLET | ORAL | Status: AC
  Administered 2020-04-14: 06:00:00 20 mg via ORAL
  Filled 2020-04-14: qty 1

## 2020-04-14 MED ORDER — LACTATED RINGERS IV SOLN: INTRAVENOUS | Status: DC

## 2020-04-14 MED ORDER — GABAPENTIN 300 MG PO CAPS
ORAL_CAPSULE | ORAL | Status: AC
  Administered 2020-04-14: 06:00:00 300 mg via ORAL
  Filled 2020-04-14: qty 1

## 2020-04-14 MED ORDER — ROCURONIUM BROMIDE 100 MG/10ML IV SOLN
INTRAVENOUS | Status: DC | PRN
  Administered 2020-04-14: 50 mg via INTRAVENOUS

## 2020-04-14 MED ORDER — IBUPROFEN 600 MG PO TABS
600.0000 mg | ORAL_TABLET | Freq: Once | ORAL | Status: AC
  Filled 2020-04-14: qty 1

## 2020-04-14 MED ORDER — METHYLENE BLUE 0.5 % INJ SOLN
INTRAVENOUS | Status: AC
  Filled 2020-04-14: qty 10

## 2020-04-14 MED ORDER — METHYLENE BLUE 0.5 % INJ SOLN
INTRAVENOUS | Status: DC | PRN
  Administered 2020-04-14: 5 mL

## 2020-04-14 MED ORDER — SOD CITRATE-CITRIC ACID 500-334 MG/5ML PO SOLN
30.0000 mL | ORAL | Status: DC
  Filled 2020-04-14: qty 30

## 2020-04-14 MED ORDER — ACETAMINOPHEN 500 MG PO TABS: 1000.0000 mg | ORAL_TABLET | ORAL | Status: AC

## 2020-04-14 MED ORDER — ACETAMINOPHEN 500 MG PO TABS
ORAL_TABLET | ORAL | Status: AC
  Administered 2020-04-14: 06:00:00 1000 mg via ORAL
  Filled 2020-04-14: qty 2

## 2020-04-14 MED ORDER — GABAPENTIN 300 MG PO CAPS: 300.0000 mg | ORAL_CAPSULE | ORAL | Status: AC

## 2020-04-14 MED ORDER — ESTROGENS, CONJUGATED 0.625 MG/GM VA CREA
TOPICAL_CREAM | VAGINAL | Status: AC
  Filled 2020-04-14: qty 30

## 2020-04-14 MED ORDER — PHENYLEPHRINE HCL (PRESSORS) 10 MG/ML IV SOLN
INTRAVENOUS | Status: DC | PRN
  Administered 2020-04-14: 100 ug via INTRAVENOUS

## 2020-04-14 MED ORDER — CEFAZOLIN SODIUM-DEXTROSE 2-4 GM/100ML-% IV SOLN
INTRAVENOUS | Status: AC
  Filled 2020-04-14: qty 100

## 2020-04-14 MED ORDER — SILVER NITRATE-POT NITRATE 75-25 % EX MISC
CUTANEOUS | Status: AC
  Filled 2020-04-14: qty 20

## 2020-04-14 MED ORDER — PROPOFOL 10 MG/ML IV BOLUS
INTRAVENOUS | Status: DC | PRN
  Administered 2020-04-14: 120 mg via INTRAVENOUS

## 2020-04-14 MED ORDER — CEFAZOLIN SODIUM-DEXTROSE 2-4 GM/100ML-% IV SOLN
2.0000 g | Freq: Once | INTRAVENOUS | Status: AC
  Administered 2020-04-14: 2 g via INTRAVENOUS

## 2020-04-14 MED ORDER — OXYCODONE HCL 5 MG PO TABS: 5.0000 mg | ORAL_TABLET | Freq: Once | ORAL | Status: DC | PRN

## 2020-04-14 MED ORDER — SODIUM CHLORIDE 0.9 % IV SOLN: INTRAVENOUS | Status: DC

## 2020-04-14 MED ORDER — LIDOCAINE-EPINEPHRINE 1 %-1:100000 IJ SOLN
INTRAMUSCULAR | Status: DC | PRN
  Administered 2020-04-14: 16 mL

## 2020-04-14 MED ORDER — SUGAMMADEX SODIUM 200 MG/2ML IV SOLN
INTRAVENOUS | Status: DC | PRN
  Administered 2020-04-14: 125 mg via INTRAVENOUS

## 2020-04-14 MED ORDER — OXYCODONE HCL 5 MG/5ML PO SOLN: 5.0000 mg | Freq: Once | ORAL | Status: DC | PRN

## 2020-04-14 SURGICAL SUPPLY — 77 items
BAG URINE DRAIN 2000ML AR STRL (UROLOGICAL SUPPLIES) ×4 IMPLANT
BLADE SURG 15 STRL LF DISP TIS (BLADE) ×3 IMPLANT
BLADE SURG 15 STRL SS (BLADE) ×1
BLADE SURG SZ10 CARB STEEL (BLADE) ×4 IMPLANT
BLADE SURG SZ11 CARB STEEL (BLADE) ×4 IMPLANT
BNDG GAUZE 4.5X4.1 6PLY STRL (MISCELLANEOUS) IMPLANT
CANISTER SUCT 1200ML W/VALVE (MISCELLANEOUS) ×4 IMPLANT
CATH FOLEY 2WAY  5CC 16FR (CATHETERS) ×1
CATH ROBINSON RED A/P 16FR (CATHETERS) ×4 IMPLANT
CATH URTH 16FR FL 2W BLN LF (CATHETERS) ×3 IMPLANT
CHLORAPREP W/TINT 26 (MISCELLANEOUS) ×4 IMPLANT
COVER WAND RF STERILE (DRAPES) ×4 IMPLANT
DRAPE 3/4 80X56 (DRAPES) ×4 IMPLANT
DRAPE PERI LITHO V/GYN (MISCELLANEOUS) ×4 IMPLANT
DRAPE SURG 17X11 SM STRL (DRAPES) ×4 IMPLANT
DRAPE UNDER BUTTOCK W/FLU (DRAPES) ×4 IMPLANT
DRSG TEGADERM 2-3/8X2-3/4 SM (GAUZE/BANDAGES/DRESSINGS) ×4 IMPLANT
ELECT REM PT RETURN 9FT ADLT (ELECTROSURGICAL) ×4
ELECTRODE REM PT RTRN 9FT ADLT (ELECTROSURGICAL) ×3 IMPLANT
GAUZE 4X4 16PLY RFD (DISPOSABLE) ×8 IMPLANT
GAUZE PACK 2X3YD (GAUZE/BANDAGES/DRESSINGS) ×4 IMPLANT
GLOVE BIO SURGEON STRL SZ7 (GLOVE) ×4 IMPLANT
GLOVE SURG SYN 6.5 ES PF (GLOVE) ×8 IMPLANT
GLOVE SURG SYN 7.5  E (GLOVE) ×1
GLOVE SURG SYN 7.5 E (GLOVE) ×3 IMPLANT
GLOVE SURG SYN 8.0 (GLOVE) ×12 IMPLANT
GOWN STRL REUS W/ TWL LRG LVL3 (GOWN DISPOSABLE) ×9 IMPLANT
GOWN STRL REUS W/ TWL XL LVL3 (GOWN DISPOSABLE) ×3 IMPLANT
GOWN STRL REUS W/TWL LRG LVL3 (GOWN DISPOSABLE) ×3
GOWN STRL REUS W/TWL XL LVL3 (GOWN DISPOSABLE) ×1
GRASPER SUT TROCAR 14GX15 (MISCELLANEOUS) IMPLANT
IRRIGATION STRYKERFLOW (MISCELLANEOUS) IMPLANT
IRRIGATOR STRYKERFLOW (MISCELLANEOUS)
IV NS 1000ML (IV SOLUTION)
IV NS 1000ML BAXH (IV SOLUTION) IMPLANT
KIT TURNOVER CYSTO (KITS) ×4 IMPLANT
KIT TURNOVER KIT A (KITS) ×4 IMPLANT
KITTNER LAPARASCOPIC 5X40 (MISCELLANEOUS) IMPLANT
LABEL OR SOLS (LABEL) ×4 IMPLANT
NDL SAFETY ECLIPSE 18X1.5 (NEEDLE) ×3 IMPLANT
NEEDLE FILTER BLUNT 18X 1/2SAF (NEEDLE) ×1
NEEDLE FILTER BLUNT 18X1 1/2 (NEEDLE) ×3 IMPLANT
NEEDLE HYPO 18GX1.5 SHARP (NEEDLE) ×1
NEEDLE HYPO 22GX1.5 SAFETY (NEEDLE) ×4 IMPLANT
NS IRRIG 500ML POUR BTL (IV SOLUTION) ×4 IMPLANT
PACK BASIN MINOR (MISCELLANEOUS) ×4 IMPLANT
PACK GYN LAPAROSCOPIC (MISCELLANEOUS) ×4 IMPLANT
PAD OB MATERNITY 4.3X12.25 (PERSONAL CARE ITEMS) ×4 IMPLANT
PAD PREP 24X41 OB/GYN DISP (PERSONAL CARE ITEMS) ×4 IMPLANT
PENCIL ELECTRO HAND CTR (MISCELLANEOUS) ×4 IMPLANT
POUCH SPECIMEN RETRIEVAL 10MM (ENDOMECHANICALS) IMPLANT
SET TUBE SMOKE EVAC HIGH FLOW (TUBING) ×4 IMPLANT
SHEARS HARMONIC ACE PLUS 36CM (ENDOMECHANICALS) IMPLANT
SLEEVE ENDOPATH XCEL 5M (ENDOMECHANICALS) ×4 IMPLANT
SPONGE GAUZE 2X2 8PLY STRL LF (GAUZE/BANDAGES/DRESSINGS) ×4 IMPLANT
STRIP CLOSURE SKIN 1/4X4 (GAUZE/BANDAGES/DRESSINGS) ×4 IMPLANT
SUT PDS AB 2-0 CT1 27 (SUTURE) ×4 IMPLANT
SUT PDS II 3-0 (SUTURE) ×4 IMPLANT
SUT VIC AB 0 CT1 27 (SUTURE) ×2
SUT VIC AB 0 CT1 27XCR 8 STRN (SUTURE) ×6 IMPLANT
SUT VIC AB 0 CT1 36 (SUTURE) ×16 IMPLANT
SUT VIC AB 2-0 CT1 36 (SUTURE) IMPLANT
SUT VIC AB 2-0 SH 27 (SUTURE) ×7
SUT VIC AB 2-0 SH 27XBRD (SUTURE) ×21 IMPLANT
SUT VIC AB 2-0 UR6 27 (SUTURE) ×4 IMPLANT
SUT VIC AB 3-0 SH 27 (SUTURE) ×1
SUT VIC AB 3-0 SH 27X BRD (SUTURE) ×3 IMPLANT
SUT VIC AB 4-0 SH 27 (SUTURE) ×1
SUT VIC AB 4-0 SH 27XANBCTRL (SUTURE) ×3 IMPLANT
SWABSTK COMLB BENZOIN TINCTURE (MISCELLANEOUS) ×4 IMPLANT
SYR 10ML LL (SYRINGE) ×8 IMPLANT
SYR 30ML LL (SYRINGE) ×4 IMPLANT
SYR CONTROL 10ML LL (SYRINGE) ×4 IMPLANT
SYR TOOMEY IRRIG 70ML (MISCELLANEOUS) ×4
SYRINGE TOOMEY IRRIG 70ML (MISCELLANEOUS) ×3 IMPLANT
TROCAR ENDO BLADELESS 11MM (ENDOMECHANICALS) IMPLANT
TROCAR XCEL NON-BLD 5MMX100MML (ENDOMECHANICALS) ×4 IMPLANT

## 2020-04-14 NOTE — Progress Notes (Signed)
Ready for L/S cervical trachlectomy . Anterior / posterior repair . Possible bilateral oophorectomy   LAbs reviewed . All questions answered Proceed

## 2020-04-14 NOTE — Anesthesia Procedure Notes (Signed)
Procedure Name: Intubation Date/Time: 04/14/2020 7:40 AM Performed by: Chanetta Marshall, CRNA Pre-anesthesia Checklist: Patient identified, Emergency Drugs available, Suction available and Patient being monitored Patient Re-evaluated:Patient Re-evaluated prior to induction Oxygen Delivery Method: Circle system utilized Preoxygenation: Pre-oxygenation with 100% oxygen Induction Type: IV induction Ventilation: Mask ventilation without difficulty Laryngoscope Size: McGraph and 3 Grade View: Grade I Tube type: Oral Number of attempts: 1 Airway Equipment and Method: Video-laryngoscopy Placement Confirmation: ETT inserted through vocal cords under direct vision,  positive ETCO2,  breath sounds checked- equal and bilateral and CO2 detector Secured at: 21 cm Tube secured with: Tape Dental Injury: Teeth and Oropharynx as per pre-operative assessment

## 2020-04-14 NOTE — Anesthesia Preprocedure Evaluation (Signed)
Anesthesia Evaluation  Patient identified by MRN, date of birth, ID band Patient awake    Reviewed: Allergy & Precautions, NPO status , Patient's Chart, lab work & pertinent test results  History of Anesthesia Complications Negative for: history of anesthetic complications  Airway Mallampati: II  TM Distance: <3 FB Neck ROM: Full    Dental no notable dental hx.    Pulmonary sleep apnea and Continuous Positive Airway Pressure Ventilation , neg COPD, former smoker,    breath sounds clear to auscultation- rhonchi (-) wheezing      Cardiovascular Exercise Tolerance: Good (-) hypertension(-) CAD, (-) Past MI, (-) Cardiac Stents and (-) CABG  Rhythm:Regular Rate:Normal - Systolic murmurs and - Diastolic murmurs    Neuro/Psych neg Seizures negative neurological ROS  negative psych ROS   GI/Hepatic Neg liver ROS, GERD  ,  Endo/Other  diabetes, Oral Hypoglycemic Agents  Renal/GU negative Renal ROS     Musculoskeletal  (+) Arthritis ,   Abdominal (+) - obese,   Peds  Hematology negative hematology ROS (+)   Anesthesia Other Findings Past Medical History: No date: Arthritis No date: Diabetes mellitus without complication (HCC) No date: GERD (gastroesophageal reflux disease) No date: Sleep apnea   Reproductive/Obstetrics                             Anesthesia Physical Anesthesia Plan  ASA: II  Anesthesia Plan: General   Post-op Pain Management:    Induction: Intravenous  PONV Risk Score and Plan: 2 and Dexamethasone, Ondansetron and Treatment may vary due to age or medical condition  Airway Management Planned: Oral ETT  Additional Equipment:   Intra-op Plan:   Post-operative Plan: Extubation in OR  Informed Consent: I have reviewed the patients History and Physical, chart, labs and discussed the procedure including the risks, benefits and alternatives for the proposed anesthesia with  the patient or authorized representative who has indicated his/her understanding and acceptance.     Dental advisory given  Plan Discussed with: CRNA and Anesthesiologist  Anesthesia Plan Comments:         Anesthesia Quick Evaluation

## 2020-04-14 NOTE — Transfer of Care (Signed)
Immediate Anesthesia Transfer of Care Note  Patient: Elaine Wright  Procedure(s) Performed: LAPAROSCOPIC TRACHELECTOMY (N/A ) ANTERIOR (CYSTOCELE) AND POSTERIOR REPAIR (RECTOCELE) (N/A )  Patient Location: PACU  Anesthesia Type:General  Level of Consciousness: awake, alert  and oriented  Airway & Oxygen Therapy: Patient Spontanous Breathing and Patient connected to nasal cannula oxygen  Post-op Assessment: Report given to RN and Post -op Vital signs reviewed and stable  Post vital signs: Reviewed and stable  Last Vitals:  Vitals Value Taken Time  BP    Temp    Pulse    Resp    SpO2      Last Pain:  Vitals:   04/14/20 0611  TempSrc: Tympanic  PainSc: 0-No pain         Complications: No apparent anesthesia complications

## 2020-04-14 NOTE — Discharge Instructions (Signed)

## 2020-04-14 NOTE — Progress Notes (Signed)
Reviewed Dr. Tonette Bihari instructions with Orlena Sheldon, RN, in post op. After patient voids, nurse to record output, bladder scan pt, and page MD.

## 2020-04-14 NOTE — Anesthesia Postprocedure Evaluation (Signed)
Anesthesia Post Note  Patient: VERDELLA STOEN  Procedure(s) Performed: LAPAROSCOPIC TRACHELECTOMY (N/A ) ANTERIOR (CYSTOCELE) AND POSTERIOR REPAIR (RECTOCELE) (N/A ) REPAIR OF ENTEROCELE  Patient location during evaluation: PACU Anesthesia Type: General Level of consciousness: awake and alert and oriented Pain management: pain level controlled Vital Signs Assessment: post-procedure vital signs reviewed and stable Respiratory status: spontaneous breathing, nonlabored ventilation and respiratory function stable Cardiovascular status: blood pressure returned to baseline and stable Postop Assessment: no signs of nausea or vomiting Anesthetic complications: no     Last Vitals:  Vitals:   04/14/20 0611 04/14/20 1041  BP: 128/90   Pulse: (!) 59 68  Resp: 15 16  Temp: 36.7 C (!) 36.3 C  SpO2: 98% 97%    Last Pain:  Vitals:   04/14/20 1041  TempSrc:   PainSc: Asleep                 Elizabeth Paulsen

## 2020-04-14 NOTE — Brief Op Note (Signed)
04/14/2020  10:35 AM  PATIENT:  Elaine Wright  64 y.o. female  PRE-OPERATIVE DIAGNOSIS:  Rectocele, Cystocele, Cervical Stump Descensus  POST-OPERATIVE DIAGNOSIS:  Rectocele, Cystocele, Cervical Stump Descensus  PROCEDURE:  Procedure(s): LAPAROSCOPIC TRACHELECTOMY (N/A) ANTERIOR (CYSTOCELE) AND POSTERIOR REPAIR (RECTOCELE) (N/A) REPAIR OF ENTEROCELE Retrograde filling of bladder  SURGEON:  Surgeon(s) and Role:    * Koreena Joost, Gwen Her, MD - Primary    * Benjaman Kindler, MD - Assisting  PHYSICIAN ASSISTANT: PA student Windell Hummingbird  ASSISTANTS: none   ANESTHESIA: GEta EBL:  25 mL iof 800 cc  OU - 200 cc  BLOOD ADMINISTERED:none  DRAINS: none   LOCAL MEDICATIONS USED:  MARCAINE     SPECIMEN:  Source of Specimen:  cervical stump  DISPOSITION OF SPECIMEN:  PATHOLOGY  COUNTS:  YES  TOURNIQUET:  * No tourniquets in log *  DICTATION: .Other Dictation: Dictation Number verbal  PLAN OF CARE: Discharge to home after PACU  PATIENT DISPOSITION:  PACU - hemodynamically stable.   Delay start of Pharmacological VTE agent (>24hrs) due to surgical blood loss or risk of bleeding: not applicable

## 2020-04-15 NOTE — Op Note (Signed)
NAME: Elaine Wright, Elaine Wright MEDICAL RECORD [id number] ACCOUNT [id number] DATE OF BIRTH:09-03-1956 FACILITY: [facility] LOCATION: [facility] PHYSICIAN:Nhat Hearne Josefine Class, MD  OPERATIVE REPORT  DATE OF PROCEDURE:  04/14/2020  PREOPERATIVE DIAGNOSES: 1.  Pelvic organ prolapse. 2.  Cervical prolapse. 3.  Grade III cystocele. 4.  Grade II rectocele.  POSTOPERATIVE DIAGNOSES: 1.  Pelvic organ prolapse. 2.  Cervical descensus. 3.  Cystocele. 4.  Rectocele. 5.  Enterocele.  PROCEDURE: 1.  Laparoscopic assisted cervical trachelectomy. 2.  Lysis of adhesions. 3.  Anterior colporrhaphy. 4.  Posterior colporrhaphy. 5.  Enterocele repair. 6.  Perineoplasty. 7.  Retrograde filling of the bladder.  ANESTHESIA:  General endotracheal anesthesia.  SURGEON:  Laverta Baltimore, MD  FIRST ASSISTANT:  Benjaman Kindler, MD   SECOND ASSISTANT:  PA student Cordella Register  INDICATIONS:  A 64 year old gravida 2, para 2 patient with exam findings consistent with grade III cystocele, grade II rectocele, and cervical descensus status post laparoscopic supracervical hysterectomy remotely.  Patient has opted for definitive  treatment.  DESCRIPTION OF PROCEDURE:  After adequate general endotracheal anesthesia, the patient was placed in dorsal supine position, legs in the [place] stirrups.  The patient's abdomen, perineum and vagina were prepped and draped in normal sterile fashion.   Timeout was performed.  The patient did receive 2 grams IV Ancef prior to commencement of the case.  The patient's bladder was catheterized with a red Robinson catheter, yielding 200 mL of urine.  A weighted speculum was placed in the posterior vaginal  vault and the anterior cervix was grasped with a single tooth tenaculum and a Kahn cannula was placed in the endocervix to be used for uterine manipulation during the procedure.  Attention was then directed to the patient's abdomen where a 5 mm  infraumbilical incision was made after  injecting with 0.5% Marcaine.  The 5 mm laparoscope was advanced into the abdominal cavity under direct visualization with the Optiview cannula.  Second port was placed in the left lower quadrant 3 cm medial to the  left anterior iliac spine under direct visualization the port was placed.  A 3rd port was placed in the right lower quadrant.  Again, 3 cm medial to the right anterior iliac spine and under direct visualization, the trocar was advanced.  The patient's  abdomen was fully insufflated.  There were several adhesions on the left adnexa to the left sidewall.  These were taken down with the Harmonic scalpel.  Similarly some adhesions on the right sidewall close to the trocar site on the right and these  adhesions were taken down with the Harmonic scalpel.  The cervix was then placed on tension and the anterior fascicular vesicouterine peritoneal area was dissected to aid in entry into the anterior cul-de-sac from the vaginal approach.  Posterior  cul-de-sac appeared normal.  Upper abdomen appeared normal.  Attention then was directed vaginally where the single tooth tenaculum and a Kahn cannula were removed.  Two thyroid tenacula were placed on the cervix and the cervix was circumferentially  injected with 1% lidocaine with 1:100,000 epinephrine.  A direct posterior colpotomy incision was made.  Upon entry into the posterior cul-de-sac, a long billed weighted speculum was placed.  The uterosacral ligaments were bilaterally clamped,  transected, suture ligated and held for later identification.  The anterior cervix was circumferentially incised with the Bovie.  The cardinal ligaments were then bilaterally clamped, transected, suture ligated with 0 Vicryl suture and Heaney clamps were  then used to clamp the cephalad portion of the cervix  abutting the cervical stump.  The cervical stump was then sharply removed.  These pedicles were sutured with 0 Vicryl suture.  Attention was directed to the patient's  anterior vaginal epithelium,  which was placed on tension with 2 Allis clamps.  Centrally, the vaginal epithelium was injected with 1% lidocaine with 1:100,000 epinephrine.  The vaginal epithelium was opened from the previously opened vaginal cuff opening all the way to 1.5 cm  inferior to the urethral meatus.  The endopelvic fascia was then dissected off the subvaginal epithelium.  Once this was accomplished adequately the repair took place with interrupted 2-0 Vicryl mattress sutures, reducing the cystocele.  The vaginal  epithelium was then trimmed and 0 Vicryl suture was used to close the vaginal epithelium and the previously opened cervical stump removal.  Uterosacral ligaments were plicated centrally and then attention was directed to the rectocele.  The hymenal ring  and introitus was injected with 1% lidocaine and a diamond-shaped wedge was removed sharply.  Central portion of the posterior aspect of the vaginal epithelium was injected with 1% lidocaine as a hydro dissector and to control hemostasis and the  posterior vaginal epithelium was then opened with Metzenbaum scissors.  The endopelvic fascia was then dissected off the subvaginal epithelium and enterocele was identified at the apex of the opening of the posterior vaginal epithelium.  The enterocele  sac was opened and then the enterocele was repaired with 2-0 PDS suture in a pursestring fashion.  The rectocele repair was accomplished with interrupted 2-0 Vicryl mattress sutures with good reduction of the rectocele defect.  The vaginal epithelium was  then trimmed and reapproximated centrally with 0 Vicryl suture.  Of note, the bladder was filled with water and methylene blue prior to the vaginal cuff closing to ensure there was no bladder defect at the time of removal of the cervical stump.  The  perineum was then repaired with a 2-0 Vicryl suture with a crown suture, reapproximated the perineal body centrally, and perineal skin was then  closed with a running 3-0 Vicryl suture with good approximation of tissues.  Vaginal vault could accommodate 2  fingers without difficulty.  Good hemostasis was noted.  The bladder was partially re-drained with a red Robinson catheter.  Attention was then redirected towards the patient's abdomen where the patient's abdomen was deflated and the previously placed  trocar incisions were closed with a 4-0 Vicryl interrupted suture.  Good cosmetic effect.  COMPLICATIONS:  There were no complications.  ESTIMATED BLOOD LOSS:  25 mL.  INTRAOPERATIVE FLUIDS:  800 mL.  URINE OUTPUT:  200 mL.  DISPOSITION:  The patient was taken to recovery room in good condition.  CN/NUANCE  D:04/14/2020 T:04/15/2020 JOB:[id number]

## 2020-04-17 LAB — SURGICAL PATHOLOGY

## 2020-07-24 ENCOUNTER — Ambulatory Visit
Admission: RE | Admit: 2020-07-24 | Discharge: 2020-07-24 | Disposition: A | Discharge status: Home / Self Care | Payer: 59 | Source: Ambulatory Visit | Attending: Certified Nurse Midwife | Admitting: Certified Nurse Midwife

## 2020-07-24 ENCOUNTER — Other Ambulatory Visit: Payer: Self-pay

## 2020-07-24 DIAGNOSIS — Z1231 Encounter for screening mammogram for malignant neoplasm of breast: Secondary | ICD-10-CM | POA: Insufficient documentation

## 2020-10-09 ENCOUNTER — Encounter: Payer: 59 | Admitting: Dermatology

## 2021-01-30 ENCOUNTER — Telehealth: Payer: Self-pay | Admitting: *Deleted

## 2021-01-30 DIAGNOSIS — Z122 Encounter for screening for malignant neoplasm of respiratory organs: Secondary | ICD-10-CM

## 2021-01-30 DIAGNOSIS — Z87891 Personal history of nicotine dependence: Secondary | ICD-10-CM

## 2021-01-30 DIAGNOSIS — F17200 Nicotine dependence, unspecified, uncomplicated: Secondary | ICD-10-CM

## 2021-01-30 NOTE — Telephone Encounter (Signed)
Received referral for initial lung cancer screening scan. Contacted patient and obtained smoking history,(current, 22.5 pack year ) as well as answering questions related to screening process. Patient denies signs of lung cancer such as weight loss or hemoptysis. Patient denies comorbidity that would prevent curative treatment if lung cancer were found. Patient is scheduled for shared decision making visit and CT scan on 02/07/21 at 1015am.

## 2021-02-05 ENCOUNTER — Other Ambulatory Visit: Payer: Self-pay

## 2021-02-05 ENCOUNTER — Ambulatory Visit: Payer: Medicare Other | Admitting: Dermatology

## 2021-02-05 DIAGNOSIS — L578 Other skin changes due to chronic exposure to nonionizing radiation: Secondary | ICD-10-CM

## 2021-02-05 DIAGNOSIS — D229 Melanocytic nevi, unspecified: Secondary | ICD-10-CM

## 2021-02-05 DIAGNOSIS — Z1283 Encounter for screening for malignant neoplasm of skin: Secondary | ICD-10-CM | POA: Diagnosis not present

## 2021-02-05 DIAGNOSIS — L719 Rosacea, unspecified: Secondary | ICD-10-CM | POA: Diagnosis not present

## 2021-02-05 DIAGNOSIS — L821 Other seborrheic keratosis: Secondary | ICD-10-CM

## 2021-02-05 DIAGNOSIS — L820 Inflamed seborrheic keratosis: Secondary | ICD-10-CM | POA: Diagnosis not present

## 2021-02-05 DIAGNOSIS — L814 Other melanin hyperpigmentation: Secondary | ICD-10-CM

## 2021-02-05 DIAGNOSIS — D2261 Melanocytic nevi of right upper limb, including shoulder: Secondary | ICD-10-CM

## 2021-02-05 MED ORDER — METRONIDAZOLE 0.75 % EX CREA
TOPICAL_CREAM | Freq: Every day | CUTANEOUS | 6 refills | Status: AC
Start: 2021-02-05 — End: 2022-02-05

## 2021-02-05 NOTE — Progress Notes (Signed)
Follow-Up Visit   Subjective  Elaine Wright is a 65 y.o. female who presents for the following: Total body skin exam (No hx of skin ca, no concerns). Has a few spots that itch and get irritated on scalp, back, and face.  The following portions of the chart were reviewed this encounter and updated as appropriate:       Review of Systems:  No other skin or systemic complaints except as noted in HPI or Assessment and Plan.  Objective  Well appearing patient in no apparent distress; mood and affect are within normal limits.  A full examination was performed including scalp, head, eyes, ears, nose, lips, neck, chest, axillae, abdomen, back, buttocks, bilateral upper extremities, bilateral lower extremities, hands, feet, fingers, toes, fingernails, and toenails. All findings within normal limits unless otherwise noted below.  Objective  Left Upper Arm - Posterior: 1.0cm speckled brown macule with irregular border, stable when compared to previous photo.  Present since childhood, no changes  Images    Objective  Left Upper Back x 1, R forehead x 1, crown scalp x 1 (3): Erythematous keratotic or waxy stuck-on papule  Pink brown waxy macule R forehead  Objective  face: Erythema with telangiectasias bil malar cheeks   Assessment & Plan    Lentigines - Scattered tan macules - Discussed due to sun exposure - Benign, observe - Call for any changes  Seborrheic Keratoses - Stuck-on, waxy, tan-brown papules and plaques  - Discussed benign etiology and prognosis. - Observe - Call for any changes  Melanocytic Nevi - Tan-brown and/or pink-flesh-colored symmetric macules and papules - Benign appearing on exam today - Observation - Call clinic for new or changing moles - Recommend daily use of broad spectrum spf 30+ sunscreen to sun-exposed areas.   Actinic Damage - Chronic, secondary to cumulative UV/sun exposure - diffuse scaly erythematous macules with underlying  dyspigmentation - Recommend daily broad spectrum sunscreen SPF 30+ to sun-exposed areas, reapply every 2 hours as needed.  - Call for new or changing lesions.  Skin cancer screening performed today.   Nevus spilus Left Upper Arm - Posterior  Bx proven benign 02/04/2011 Stable  Observe for changes  Inflamed seborrheic keratosis (3) Left Upper Back x 1, R forehead x 1, crown scalp x 1  Destruction of lesion - Left Upper Back x 1, R forehead x 1, crown scalp x 1  Destruction method: cryotherapy   Informed consent: discussed and consent obtained   Lesion destroyed using liquid nitrogen: Yes   Region frozen until ice ball extended beyond lesion: Yes   Outcome: patient tolerated procedure well with no complications   Post-procedure details: wound care instructions given    Rosacea face  Rosacea is a chronic progressive skin condition usually affecting the face of adults, causing redness and/or acne bumps. It is treatable but not curable. It sometimes affects the eyes (ocular rosacea) as well. It may respond to topical and/or systemic medication and can flare with stress, sun exposure, alcohol, exercise and some foods.  Daily application of broad spectrum spf 30+ sunscreen to face is recommended to reduce flares.  Redness likely exacerbated by CPAP Start Metronidazole 0.75% cr qhs  metroNIDAZOLE (METROCREAM) 0.75 % cream - face  Return in about 1 year (around 02/05/2022) for TBSE.   I, Othelia Pulling, RMA, am acting as scribe for Brendolyn Patty, MD . Documentation: I have reviewed the above documentation for accuracy and completeness, and I agree with the above.  Brendolyn Patty MD

## 2021-02-07 ENCOUNTER — Other Ambulatory Visit: Payer: Self-pay

## 2021-02-07 ENCOUNTER — Inpatient Hospital Stay: Payer: Medicare Other | Attending: Oncology | Admitting: Oncology

## 2021-02-07 ENCOUNTER — Encounter: Payer: Self-pay | Admitting: Oncology

## 2021-02-07 ENCOUNTER — Ambulatory Visit
Admission: RE | Admit: 2021-02-07 | Discharge: 2021-02-07 | Disposition: A | Discharge status: Home / Self Care | Payer: Medicare Other | Source: Ambulatory Visit | Attending: Oncology | Admitting: Oncology

## 2021-02-07 DIAGNOSIS — F17200 Nicotine dependence, unspecified, uncomplicated: Secondary | ICD-10-CM | POA: Insufficient documentation

## 2021-02-07 DIAGNOSIS — Z87891 Personal history of nicotine dependence: Secondary | ICD-10-CM | POA: Insufficient documentation

## 2021-02-07 DIAGNOSIS — Z122 Encounter for screening for malignant neoplasm of respiratory organs: Secondary | ICD-10-CM | POA: Diagnosis present

## 2021-02-07 DIAGNOSIS — F17210 Nicotine dependence, cigarettes, uncomplicated: Secondary | ICD-10-CM

## 2021-02-07 NOTE — Progress Notes (Signed)
Virtual Visit via Video Note  I connected with Elaine Wright on 02/07/21 at 10:15 AM EST by a video enabled telemedicine application and verified that I am speaking with the correct person using two identifiers.  Location: Patient: [facility] Provider: Clinic     I discussed the limitations of evaluation and management by telemedicine and the availability of in person appointments. The patient expressed understanding and agreed to proceed.  I discussed the assessment and treatment plan with the patient. The patient was provided an opportunity to ask questions and all were answered. The patient agreed with the plan and demonstrated an understanding of the instructions.   The patient was advised to call back or seek an in-person evaluation if the symptoms worsen or if the condition fails to improve as anticipated.   In accordance with CMS guidelines, patient has met eligibility criteria including age, absence of signs or symptoms of lung cancer.  Social History   Tobacco Use   Smoking status: Current Every Day Smoker    Packs/day: 0.50    Years: 45.00    Pack years: 22.50    Types: Cigarettes   Smokeless tobacco: Never Used  Scientific laboratory technician Use: Some days   Substances: Nicotine, Flavoring  Substance Use Topics   Alcohol use: Yes    Comment:  2 glasses of wine a month   Drug use: No      A shared decision-making session was conducted prior to the performance of CT scan. This includes one or more decision aids, includes benefits and harms of screening, follow-up diagnostic testing, over-diagnosis, false positive rate, and total radiation exposure.   Counseling on the importance of adherence to annual lung cancer LDCT screening, impact of co-morbidities, and ability or willingness to undergo diagnosis and treatment is imperative for compliance of the program.   Counseling on the importance of continued smoking cessation for former smokers; the importance of smoking cessation for  current smokers, and information about tobacco cessation interventions have been given to patient including [place] and [facility] quit [place] programs.   Written order for lung cancer screening with LDCT has been given to the patient and any and all questions have been answered to the best of my abilities.    Yearly follow up will be coordinated by Burgess Estelle, Thoracic Navigator.  I provided 15 minutes of face-to-face video visit time during this encounter, and > 50% was spent counseling as documented under my assessment & plan.   Jacquelin Hawking, NP

## 2021-02-14 ENCOUNTER — Encounter: Payer: Self-pay | Admitting: *Deleted

## 2021-03-21 ENCOUNTER — Other Ambulatory Visit: Payer: Self-pay

## 2021-03-21 ENCOUNTER — Emergency Department
Admission: EM | Admit: 2021-03-21 | Discharge: 2021-03-21 | Disposition: A | Discharge status: Home / Self Care | Payer: Medicare Other | Source: Home / Self Care | Attending: Emergency Medicine | Admitting: Emergency Medicine

## 2021-03-21 ENCOUNTER — Emergency Department: Payer: Medicare Other

## 2021-03-21 DIAGNOSIS — E119 Type 2 diabetes mellitus without complications: Secondary | ICD-10-CM | POA: Insufficient documentation

## 2021-03-21 DIAGNOSIS — F17210 Nicotine dependence, cigarettes, uncomplicated: Secondary | ICD-10-CM | POA: Insufficient documentation

## 2021-03-21 DIAGNOSIS — Z96652 Presence of left artificial knee joint: Secondary | ICD-10-CM | POA: Insufficient documentation

## 2021-03-21 DIAGNOSIS — Z96651 Presence of right artificial knee joint: Secondary | ICD-10-CM | POA: Insufficient documentation

## 2021-03-21 DIAGNOSIS — I2109 ST elevation (STEMI) myocardial infarction involving other coronary artery of anterior wall: Secondary | ICD-10-CM | POA: Diagnosis not present

## 2021-03-21 DIAGNOSIS — J441 Chronic obstructive pulmonary disease with (acute) exacerbation: Secondary | ICD-10-CM

## 2021-03-21 DIAGNOSIS — Z7984 Long term (current) use of oral hypoglycemic drugs: Secondary | ICD-10-CM | POA: Insufficient documentation

## 2021-03-21 DIAGNOSIS — R1310 Dysphagia, unspecified: Secondary | ICD-10-CM | POA: Insufficient documentation

## 2021-03-21 DIAGNOSIS — R0603 Acute respiratory distress: Secondary | ICD-10-CM | POA: Diagnosis not present

## 2021-03-21 LAB — COMPREHENSIVE METABOLIC PANEL
ALT: 20 U/L (ref 0–44)
AST: 23 U/L (ref 15–41)
Albumin: 4.5 g/dL (ref 3.5–5.0)
Alkaline Phosphatase: 68 U/L (ref 38–126)
Anion gap: 9 (ref 5–15)
BUN: 17 mg/dL (ref 8–23)
CO2: 24 mmol/L (ref 22–32)
Calcium: 9.2 mg/dL (ref 8.9–10.3)
Chloride: 105 mmol/L (ref 98–111)
Creatinine, Ser: 0.65 mg/dL (ref 0.44–1.00)
GFR, Estimated: >60 mL/min (ref >60)
Glucose, Bld: 111 mg/dL — ABNORMAL HIGH (ref 70–99)
Potassium: 4.2 mmol/L (ref 3.5–5.1)
Sodium: 138 mmol/L (ref 135–145)
Total Bilirubin: 0.7 mg/dL (ref 0.3–1.2)
Total Protein: 7.2 g/dL (ref 6.5–8.1)

## 2021-03-21 LAB — CBC
HCT: 42.4 % (ref 36.0–46.0)
Hemoglobin: 13.8 g/dL (ref 12.0–15.0)
MCH: 28.1 pg (ref 26.0–34.0)
MCHC: 32.5 g/dL (ref 30.0–36.0)
MCV: 86.4 fL (ref 80.0–100.0)
Platelets: 284 10*3/uL (ref 150–400)
RBC: 4.91 MIL/uL (ref 3.87–5.11)
RDW: 13.7 % (ref 11.5–15.5)
WBC: 12.1 10*3/uL — ABNORMAL HIGH (ref 4.0–10.5)
nRBC: 0 % (ref 0.0–0.2)

## 2021-03-21 LAB — TROPONIN I (HIGH SENSITIVITY): Troponin I (High Sensitivity): 7 ng/L (ref <18)

## 2021-03-21 MED ORDER — IOHEXOL 300 MG/ML  SOLN
75.0000 mL | Freq: Once | INTRAMUSCULAR | Status: AC | PRN
  Administered 2021-03-21: 75 mL via INTRAVENOUS

## 2021-03-21 MED ORDER — PREDNISONE 20 MG PO TABS: 60.0000 mg | ORAL_TABLET | Freq: Every day | ORAL | 0 refills | Status: DC

## 2021-03-21 MED ORDER — IPRATROPIUM-ALBUTEROL 0.5-2.5 (3) MG/3ML IN SOLN
6.0000 mL | Freq: Once | RESPIRATORY_TRACT | Status: AC
  Administered 2021-03-21: 6 mL via RESPIRATORY_TRACT
  Filled 2021-03-21: qty 3

## 2021-03-21 MED ORDER — ALBUTEROL SULFATE HFA 108 (90 BASE) MCG/ACT IN AERS
2.0000 | INHALATION_SPRAY | RESPIRATORY_TRACT | Status: DC | PRN
  Filled 2021-03-21: qty 6.7

## 2021-03-21 MED ORDER — IPRATROPIUM-ALBUTEROL 0.5-2.5 (3) MG/3ML IN SOLN
3.0000 mL | Freq: Once | RESPIRATORY_TRACT | Status: AC
  Administered 2021-03-21: 3 mL via RESPIRATORY_TRACT
  Filled 2021-03-21: qty 3

## 2021-03-21 MED ORDER — PREDNISONE 20 MG PO TABS
60.0000 mg | ORAL_TABLET | Freq: Once | ORAL | Status: AC
  Administered 2021-03-21: 60 mg via ORAL
  Filled 2021-03-21: qty 3

## 2021-03-21 MED ORDER — ALBUTEROL SULFATE HFA 108 (90 BASE) MCG/ACT IN AERS: 2.0000 | INHALATION_SPRAY | Freq: Four times a day (QID) | RESPIRATORY_TRACT | 0 refills | Status: DC | PRN

## 2021-03-21 MED ORDER — OMEPRAZOLE MAGNESIUM 20 MG PO TBEC: 20.0000 mg | DELAYED_RELEASE_TABLET | Freq: Every day | ORAL | 1 refills | Status: DC

## 2021-03-21 NOTE — ED Triage Notes (Signed)
Pt states that she was prescribed a zpack a couple weeks ago, states this am she was awakened at 0230 with difficulty breathing, pt sounds tight and has some inspiratory stridor. Pt reports that she had a cxr today at the walk in, states that she used her inhaler without relief

## 2021-03-21 NOTE — ED Notes (Signed)
Pt continues to complain due to the wait , ed md waiting for ct results , patient has been updated by md and rn multiple times.

## 2021-03-21 NOTE — ED Provider Notes (Signed)
[facility] Emergency Department Provider Note   ____________________________________________   Event Date/Time   First MD Initiated Contact with Patient 03/21/21 1613     (approximate)  I have reviewed the triage vital signs and the nursing notes.   HISTORY  Chief Complaint Shortness of Breath    HPI Elaine Wright is a 65 y.o. female with past medical history of diabetes, GERD, and sleep apnea who presents to the ED complaining of shortness of breath.  Patient reports that she has developed dry cough with increasing difficulty breathing over about the past 2 weeks.  Symptoms then acutely worsened around 230 this morning, when she woke up with worsening difficulty breathing.  She has been prescribed azithromycin as well as an inhaler by her PCP without significant relief.  She feels very tight in her chest and like something is blocking air from getting in or out.  She additionally reports difficulty swallowing at times, feels like food will get stuck part way down her esophagus.  She has not had any nausea or vomiting and denies any fevers.  She has never been diagnosed with COPD or asthma, but admits to smoking about 1/2 pack/day cigarettes for many years up until 2 weeks ago.        Past Medical History:  Diagnosis Date  . Arthritis   . Diabetes mellitus without complication ([place])   . GERD (gastroesophageal reflux disease)   . Sleep apnea     There are no problems to display for this patient.   Past Surgical History:  Procedure Laterality Date  . ABDOMINAL HYSTERECTOMY  2003  . ANTERIOR AND POSTERIOR REPAIR N/A 04/14/2020   Procedure: ANTERIOR (CYSTOCELE) AND POSTERIOR REPAIR (RECTOCELE);  Surgeon: Schermerhorn, Gwen Her, MD;  Location: [facility];  Service: Gynecology;  Laterality: N/A;  . EYE SURGERY  2011   Cataract bil  . PARTIAL KNEE ARTHROPLASTY  2009   right   . Thumb joint     Tendon from arm to replace arthrititic thumb  . TOTAL KNEE  ARTHROPLASTY  12/23/2012   Procedure: TOTAL KNEE ARTHROPLASTY;  Surgeon: Ninetta Lights, MD;  Location: [place];  Service: Orthopedics;  Laterality: Left;  . TRACHELECTOMY N/A 04/14/2020   Procedure: LAPAROSCOPIC TRACHELECTOMY;  Surgeon: Schermerhorn, Gwen Her, MD;  Location: [facility];  Service: Gynecology;  Laterality: N/A;    Prior to Admission medications   Medication Sig Start Date End Date Taking? Authorizing Provider  albuterol (VENTOLIN HFA) 108 (90 Base) MCG/ACT inhaler Inhale 2 puffs into the lungs every 6 (six) hours as needed for wheezing or shortness of breath. 03/21/21  Yes Blake Divine, MD  omeprazole (PRILOSEC OTC) 20 MG tablet Take 1 tablet (20 mg total) by mouth daily. 03/21/21 03/21/22 Yes Blake Divine, MD  predniSONE (DELTASONE) 20 MG tablet Take 3 tablets (60 mg total) by mouth daily with breakfast for 5 days. 03/21/21 03/26/21 Yes Blake Divine, MD  metFORMIN (GLUCOPHAGE-XR) 500 MG 24 hr tablet Take 1,000 mg by mouth daily. afternoon 03/04/20   [provider]  metroNIDAZOLE (METROCREAM) 0.75 % cream Apply topically at bedtime. qhs to face for Rosacea 02/05/21 02/05/22  Brendolyn Patty, MD  naproxen sodium (ALEVE) 220 MG tablet Take 220 mg by mouth daily as needed (pain).    [provider]    Allergies Morphine and related  Family History  Problem Relation Age of Onset  . Breast cancer Neg Hx     Social History Social History   Tobacco Use  .  Smoking status: Current Every Day Smoker    Packs/day: 0.50    Years: 45.00    Pack years: 22.50    Types: Cigarettes  . Smokeless tobacco: Never Used  Vaping Use  . Vaping Use: Some days  . Substances: Nicotine, Flavoring  Substance Use Topics  . Alcohol use: Yes    Comment:  2 glasses of wine a month  . Drug use: No    Review of Systems  Constitutional: No fever/chills Eyes: No visual changes. ENT: No sore throat. Cardiovascular: Positive for chest tightness. Respiratory: Positive for cough and  shortness of breath. Gastrointestinal: No abdominal pain.  No nausea, no vomiting.  No diarrhea.  No constipation.  Positive for dysphagia. Genitourinary: Negative for dysuria. Musculoskeletal: Negative for back pain. Skin: Negative for rash. Neurological: Negative for headaches, focal weakness or numbness.  ____________________________________________   PHYSICAL EXAM:  VITAL SIGNS: ED Triage Vitals  Enc Vitals Group     BP 03/21/21 1541 (!) 153/107     Pulse Rate 03/21/21 1541 85     Resp 03/21/21 1541 (!) 23     Temp 03/21/21 1541 98.2 F (36.8 C)     Temp Source 03/21/21 1541 Oral     SpO2 03/21/21 1541 97 %     Weight 03/21/21 1550 147 lb (66.7 kg)     Height 03/21/21 1550 5' (1.524 m)     Head Circumference --      Peak Flow --      Pain Score 03/21/21 1549 0     Pain Loc --      Pain Edu? --      Excl. in [place]? --     Constitutional: Alert and oriented. Eyes: Conjunctivae are normal. Head: Atraumatic. Nose: No congestion/rhinnorhea. Mouth/Throat: Mucous membranes are moist.  Oropharynx clear. Neck: Normal ROM Cardiovascular: Normal rate, regular rhythm. Grossly normal heart sounds. Respiratory: Tachypneic with increased respiratory effort.  No retractions. Lungs with poor air movement and expiratory wheezing throughout. Gastrointestinal: Soft and nontender. No distention. Genitourinary: deferred Musculoskeletal: No lower extremity tenderness nor edema. Neurologic:  Normal speech and language. No gross focal neurologic deficits are appreciated. Skin:  Skin is warm, dry and intact. No rash noted. Psychiatric: Mood and affect are normal. Speech and behavior are normal.  ____________________________________________   LABS (all labs ordered are listed, but only abnormal results are displayed)  Labs Reviewed  CBC - Abnormal; Notable for the following components:      Result Value   WBC 12.1 (*)    All other components within normal limits  COMPREHENSIVE  METABOLIC PANEL - Abnormal; Notable for the following components:   Glucose, Bld 111 (*)    All other components within normal limits  TROPONIN I (HIGH SENSITIVITY)   ____________________________________________  EKG  ED ECG REPORT I, Blake Divine, the attending physician, personally viewed and interpreted this ECG.   Date: 03/21/2021  EKG Time: 15:51  Rate: 85  Rhythm: normal sinus rhythm  Axis: Normal  Intervals:none  ST&T Change: None   PROCEDURES  Procedure(s) performed (including Critical Care):  Procedures   ____________________________________________   INITIAL IMPRESSION / ASSESSMENT AND PLAN / ED COURSE       65 year old female with past medical history of diabetes, GERD, and OSA who presents to the ED with increasing cough and difficulty breathing for the past couple of weeks, acutely worse this morning at 2:30 AM.  She has associated tightness in her chest along with a feeling of obstruction when breathing.  There was concern for stridor in triage but I hear no audible stridor at this time.  Prior CT chest has been consistent with emphysema and we will treat for COPD with duo nebs and steroids.  Given her dysphagia, feelings of obstruction, and questionable stridor, we will further assess with CT neck as well as chest for any mass lesion.  CT scan is negative for mass or other acute pathology.  Patient continues to feel better with breathing much improved.  Will prescribe PPI for her dysphagia and she was given referral to GI for follow-up.  We will also prescribe prednisone and albuterol inhaler for her apparent COPD exacerbation.  She was counseled to return to the ED for new worsening symptoms, patient agrees with plan.      ____________________________________________   FINAL CLINICAL IMPRESSION(S) / ED DIAGNOSES  Final diagnoses:  COPD exacerbation ([place])  Dysphagia, unspecified type     ED Discharge Orders         Ordered    predniSONE  (DELTASONE) 20 MG tablet  Daily with breakfast        03/21/21 1926    albuterol (VENTOLIN HFA) 108 (90 Base) MCG/ACT inhaler  Every 6 hours PRN       Note to Pharmacy: Please supply with spacer   03/21/21 1926    omeprazole (PRILOSEC OTC) 20 MG tablet  Daily        03/21/21 1926           Note:  This document was prepared using Dragon voice recognition software and may include unintentional dictation errors.   Blake Divine, MD 03/21/21 [id number]

## 2021-03-24 ENCOUNTER — Inpatient Hospital Stay: Payer: Medicare Other

## 2021-03-24 ENCOUNTER — Inpatient Hospital Stay
Admission: EM | Admit: 2021-03-24 | Discharge: 2021-03-27 | DRG: 246 | Disposition: A | Discharge status: Home / Self Care | Payer: Medicare Other | Attending: Internal Medicine | Admitting: Internal Medicine

## 2021-03-24 ENCOUNTER — Other Ambulatory Visit: Payer: Self-pay

## 2021-03-24 ENCOUNTER — Encounter: Payer: Self-pay | Admitting: Cardiovascular Disease

## 2021-03-24 ENCOUNTER — Emergency Department: Payer: Medicare Other

## 2021-03-24 ENCOUNTER — Encounter
Admission: EM | Disposition: A | Discharge status: Home / Self Care | Payer: Self-pay | Source: Home / Self Care | Attending: Pulmonary Disease

## 2021-03-24 DIAGNOSIS — I2102 ST elevation (STEMI) myocardial infarction involving left anterior descending coronary artery: Secondary | ICD-10-CM

## 2021-03-24 DIAGNOSIS — K219 Gastro-esophageal reflux disease without esophagitis: Secondary | ICD-10-CM | POA: Diagnosis present

## 2021-03-24 DIAGNOSIS — G9341 Metabolic encephalopathy: Secondary | ICD-10-CM | POA: Diagnosis present

## 2021-03-24 DIAGNOSIS — G4733 Obstructive sleep apnea (adult) (pediatric): Secondary | ICD-10-CM | POA: Diagnosis present

## 2021-03-24 DIAGNOSIS — Z7984 Long term (current) use of oral hypoglycemic drugs: Secondary | ICD-10-CM

## 2021-03-24 DIAGNOSIS — I2109 ST elevation (STEMI) myocardial infarction involving other coronary artery of anterior wall: Principal | ICD-10-CM | POA: Diagnosis present

## 2021-03-24 DIAGNOSIS — Z79899 Other long term (current) drug therapy: Secondary | ICD-10-CM | POA: Diagnosis not present

## 2021-03-24 DIAGNOSIS — J9602 Acute respiratory failure with hypercapnia: Secondary | ICD-10-CM | POA: Diagnosis present

## 2021-03-24 DIAGNOSIS — E119 Type 2 diabetes mellitus without complications: Secondary | ICD-10-CM | POA: Diagnosis present

## 2021-03-24 DIAGNOSIS — Z20822 Contact with and (suspected) exposure to covid-19: Secondary | ICD-10-CM | POA: Diagnosis present

## 2021-03-24 DIAGNOSIS — Z23 Encounter for immunization: Secondary | ICD-10-CM | POA: Diagnosis present

## 2021-03-24 DIAGNOSIS — J441 Chronic obstructive pulmonary disease with (acute) exacerbation: Secondary | ICD-10-CM | POA: Diagnosis present

## 2021-03-24 DIAGNOSIS — J9601 Acute respiratory failure with hypoxia: Secondary | ICD-10-CM | POA: Diagnosis present

## 2021-03-24 DIAGNOSIS — E785 Hyperlipidemia, unspecified: Secondary | ICD-10-CM | POA: Diagnosis present

## 2021-03-24 DIAGNOSIS — F17210 Nicotine dependence, cigarettes, uncomplicated: Secondary | ICD-10-CM | POA: Diagnosis present

## 2021-03-24 DIAGNOSIS — I160 Hypertensive urgency: Secondary | ICD-10-CM | POA: Diagnosis present

## 2021-03-24 DIAGNOSIS — I5031 Acute diastolic (congestive) heart failure: Secondary | ICD-10-CM | POA: Diagnosis present

## 2021-03-24 DIAGNOSIS — Z96653 Presence of artificial knee joint, bilateral: Secondary | ICD-10-CM | POA: Diagnosis present

## 2021-03-24 DIAGNOSIS — D72828 Other elevated white blood cell count: Secondary | ICD-10-CM | POA: Diagnosis present

## 2021-03-24 DIAGNOSIS — M810 Age-related osteoporosis without current pathological fracture: Secondary | ICD-10-CM | POA: Diagnosis present

## 2021-03-24 DIAGNOSIS — I2510 Atherosclerotic heart disease of native coronary artery without angina pectoris: Secondary | ICD-10-CM | POA: Diagnosis present

## 2021-03-24 DIAGNOSIS — R0603 Acute respiratory distress: Secondary | ICD-10-CM

## 2021-03-24 DIAGNOSIS — I213 ST elevation (STEMI) myocardial infarction of unspecified site: Secondary | ICD-10-CM | POA: Diagnosis not present

## 2021-03-24 DIAGNOSIS — I509 Heart failure, unspecified: Secondary | ICD-10-CM | POA: Diagnosis not present

## 2021-03-24 HISTORY — PX: CORONARY/GRAFT ACUTE MI REVASCULARIZATION: CATH118305

## 2021-03-24 HISTORY — PX: LEFT HEART CATH AND CORONARY ANGIOGRAPHY: CATH118249

## 2021-03-24 HISTORY — DX: Atherosclerotic heart disease of native coronary artery without angina pectoris: I25.10

## 2021-03-24 LAB — CBC WITH DIFFERENTIAL/PLATELET
Abs Immature Granulocytes: 0.19 10*3/uL — ABNORMAL HIGH (ref 0.00–0.07)
Basophils Absolute: 0.1 10*3/uL (ref 0.0–0.1)
Basophils Relative: 0 %
Eosinophils Absolute: 0.7 10*3/uL — ABNORMAL HIGH (ref 0.0–0.5)
Eosinophils Relative: 3 %
HCT: 45 % (ref 36.0–46.0)
Hemoglobin: 14.6 g/dL (ref 12.0–15.0)
Immature Granulocytes: 1 %
Lymphocytes Relative: 12 %
Lymphs Abs: 3.2 10*3/uL (ref 0.7–4.0)
MCH: 28.1 pg (ref 26.0–34.0)
MCHC: 32.4 g/dL (ref 30.0–36.0)
MCV: 86.7 fL (ref 80.0–100.0)
Monocytes Absolute: 2.5 10*3/uL — ABNORMAL HIGH (ref 0.1–1.0)
Monocytes Relative: 10 %
Neutro Abs: 19.5 10*3/uL — ABNORMAL HIGH (ref 1.7–7.7)
Neutrophils Relative %: 74 %
Platelets: 378 10*3/uL (ref 150–400)
RBC: 5.19 MIL/uL — ABNORMAL HIGH (ref 3.87–5.11)
RDW: 13.6 % (ref 11.5–15.5)
Smear Review: NORMAL
WBC: 26.2 10*3/uL — ABNORMAL HIGH (ref 4.0–10.5)
nRBC: 0 % (ref 0.0–0.2)

## 2021-03-24 LAB — BRAIN NATRIURETIC PEPTIDE: B Natriuretic Peptide: 56.7 pg/mL (ref 0.0–100.0)

## 2021-03-24 LAB — BLOOD GAS, ARTERIAL
Acid-base deficit: 2.4 mmol/L — ABNORMAL HIGH (ref 0.0–2.0)
Allens test (pass/fail): POSITIVE — AB
Bicarbonate: 25.9 mmol/L (ref 20.0–28.0)
FIO2: 0.5
MECHVT: 450 mL
O2 Saturation: 99.5 %
PEEP: 5 cmH2O
Patient temperature: 37
RATE: 16 resp/min
pCO2 arterial: 59 mmHg — ABNORMAL HIGH (ref 32.0–48.0)
pH, Arterial: 7.25 — ABNORMAL LOW (ref 7.350–7.450)
pO2, Arterial: 182 mmHg — ABNORMAL HIGH (ref 83.0–108.0)

## 2021-03-24 LAB — COMPREHENSIVE METABOLIC PANEL
ALT: 25 U/L (ref 0–44)
AST: 29 U/L (ref 15–41)
Albumin: 4.5 g/dL (ref 3.5–5.0)
Alkaline Phosphatase: 73 U/L (ref 38–126)
Anion gap: 9 (ref 5–15)
BUN: 23 mg/dL (ref 8–23)
CO2: 28 mmol/L (ref 22–32)
Calcium: 8.6 mg/dL — ABNORMAL LOW (ref 8.9–10.3)
Chloride: 100 mmol/L (ref 98–111)
Creatinine, Ser: 0.65 mg/dL (ref 0.44–1.00)
GFR, Estimated: >60 mL/min (ref >60)
Glucose, Bld: 257 mg/dL — ABNORMAL HIGH (ref 70–99)
Potassium: 3.9 mmol/L (ref 3.5–5.1)
Sodium: 137 mmol/L (ref 135–145)
Total Bilirubin: 0.6 mg/dL (ref 0.3–1.2)
Total Protein: 8.1 g/dL (ref 6.5–8.1)

## 2021-03-24 LAB — RESP PANEL BY RT-PCR (FLU A&B, COVID) ARPGX2
Influenza A by PCR: NEGATIVE
Influenza B by PCR: NEGATIVE
SARS Coronavirus 2 by RT PCR: NEGATIVE

## 2021-03-24 LAB — TROPONIN I (HIGH SENSITIVITY)
Troponin I (High Sensitivity): 60 ng/L — ABNORMAL HIGH (ref <18)
Troponin I (High Sensitivity): 1126 ng/L (ref <18)

## 2021-03-24 LAB — PROTIME-INR
INR: 1 (ref 0.8–1.2)
Prothrombin Time: 12.9 seconds (ref 11.4–15.2)

## 2021-03-24 LAB — POCT ACTIVATED CLOTTING TIME
Activated Clotting Time: 315 seconds
Activated Clotting Time: 333 seconds

## 2021-03-24 LAB — MRSA PCR SCREENING: MRSA by PCR: NEGATIVE

## 2021-03-24 LAB — GLUCOSE, CAPILLARY
Glucose-Capillary: 176 mg/dL — ABNORMAL HIGH (ref 70–99)
Glucose-Capillary: 200 mg/dL — ABNORMAL HIGH (ref 70–99)

## 2021-03-24 LAB — HIV ANTIBODY (ROUTINE TESTING W REFLEX): HIV Screen 4th Generation wRfx: NONREACTIVE

## 2021-03-24 LAB — HEMOGLOBIN A1C
Hgb A1c MFr Bld: 6.9 % — ABNORMAL HIGH (ref 4.8–5.6)
Mean Plasma Glucose: 151.33 mg/dL

## 2021-03-24 SURGERY — CORONARY/GRAFT ACUTE MI REVASCULARIZATION
Anesthesia: Moderate Sedation

## 2021-03-24 MED ORDER — FENTANYL CITRATE (PF) 100 MCG/2ML IJ SOLN
25.0000 ug | Freq: Once | INTRAMUSCULAR | Status: AC
  Administered 2021-03-24: 25 ug via INTRAVENOUS
  Filled 2021-03-24: qty 2

## 2021-03-24 MED ORDER — ONDANSETRON HCL 4 MG/2ML IJ SOLN: 4.0000 mg | Freq: Four times a day (QID) | INTRAMUSCULAR | Status: DC | PRN

## 2021-03-24 MED ORDER — FUROSEMIDE 10 MG/ML IJ SOLN: 40.0000 mg | Freq: Once | INTRAMUSCULAR | Status: DC

## 2021-03-24 MED ORDER — TICAGRELOR 90 MG PO TABS
90.0000 mg | ORAL_TABLET | Freq: Two times a day (BID) | ORAL | Status: DC
  Administered 2021-03-24 – 2021-03-27 (×6): 90 mg
  Filled 2021-03-24 (×6): qty 1

## 2021-03-24 MED ORDER — HEPARIN SODIUM (PORCINE) 5000 UNIT/ML IJ SOLN
60.0000 [IU]/kg | Freq: Once | INTRAMUSCULAR | Status: AC
  Administered 2021-03-24: 4000 [IU] via INTRAVENOUS

## 2021-03-24 MED ORDER — ASPIRIN 81 MG PO CHEW
81.0000 mg | CHEWABLE_TABLET | Freq: Every day | ORAL | Status: DC
  Administered 2021-03-25 – 2021-03-27 (×3): 81 mg
  Filled 2021-03-24 (×3): qty 1

## 2021-03-24 MED ORDER — DEXMEDETOMIDINE HCL IN NACL 400 MCG/100ML IV SOLN
0.0000 ug/kg/h | INTRAVENOUS | Status: DC
  Administered 2021-03-24: 0.4 ug/kg/h via INTRAVENOUS
  Filled 2021-03-24: qty 100

## 2021-03-24 MED ORDER — ATORVASTATIN CALCIUM 80 MG PO TABS
80.0000 mg | ORAL_TABLET | Freq: Every day | ORAL | Status: DC
  Administered 2021-03-24 – 2021-03-27 (×4): 80 mg via ORAL
  Filled 2021-03-24: qty 4
  Filled 2021-03-24: qty 1
  Filled 2021-03-24 (×2): qty 4

## 2021-03-24 MED ORDER — MIDAZOLAM HCL 2 MG/2ML IJ SOLN
INTRAMUSCULAR | Status: AC
  Filled 2021-03-24: qty 2

## 2021-03-24 MED ORDER — HEPARIN (PORCINE) IN NACL 1000-0.9 UT/500ML-% IV SOLN
INTRAVENOUS | Status: AC
  Filled 2021-03-24: qty 1000

## 2021-03-24 MED ORDER — ETOMIDATE 2 MG/ML IV SOLN
INTRAVENOUS | Status: AC | PRN
  Administered 2021-03-24: 20 mg via INTRAVENOUS

## 2021-03-24 MED ORDER — HEPARIN (PORCINE) 25000 UT/250ML-% IV SOLN: 800.0000 [IU]/h | INTRAVENOUS | Status: DC

## 2021-03-24 MED ORDER — TICAGRELOR 90 MG PO TABS
ORAL_TABLET | ORAL | Status: AC
  Filled 2021-03-24: qty 2

## 2021-03-24 MED ORDER — SODIUM CHLORIDE 0.9% FLUSH
3.0000 mL | Freq: Two times a day (BID) | INTRAVENOUS | Status: DC
  Administered 2021-03-24 – 2021-03-26 (×6): 3 mL via INTRAVENOUS

## 2021-03-24 MED ORDER — SODIUM CHLORIDE 0.9 % IV SOLN: 250.0000 mL | INTRAVENOUS | Status: DC | PRN

## 2021-03-24 MED ORDER — NITROGLYCERIN IN D5W 200-5 MCG/ML-% IV SOLN
0.0000 ug/min | INTRAVENOUS | Status: DC
  Administered 2021-03-24: 40 ug/min via INTRAVENOUS

## 2021-03-24 MED ORDER — HEPARIN SODIUM (PORCINE) 1000 UNIT/ML IJ SOLN
INTRAMUSCULAR | Status: AC
  Filled 2021-03-24: qty 1

## 2021-03-24 MED ORDER — HEPARIN (PORCINE) IN NACL 1000-0.9 UT/500ML-% IV SOLN
INTRAVENOUS | Status: DC | PRN
  Administered 2021-03-24 (×2): 500 mL

## 2021-03-24 MED ORDER — HYDRALAZINE HCL 20 MG/ML IJ SOLN: 10.0000 mg | INTRAMUSCULAR | Status: AC | PRN

## 2021-03-24 MED ORDER — PROPOFOL 1000 MG/100ML IV EMUL
INTRAVENOUS | Status: AC | PRN
  Administered 2021-03-24: 30 ug/kg/min via INTRAVENOUS

## 2021-03-24 MED ORDER — IPRATROPIUM-ALBUTEROL 0.5-2.5 (3) MG/3ML IN SOLN
3.0000 mL | Freq: Four times a day (QID) | RESPIRATORY_TRACT | Status: DC | PRN
  Administered 2021-03-24: 3 mL via RESPIRATORY_TRACT
  Filled 2021-03-24: qty 3

## 2021-03-24 MED ORDER — POLYETHYLENE GLYCOL 3350 17 G PO PACK: 17.0000 g | PACK | Freq: Every day | ORAL | Status: DC | PRN

## 2021-03-24 MED ORDER — DOCUSATE SODIUM 100 MG PO CAPS: 100.0000 mg | ORAL_CAPSULE | Freq: Two times a day (BID) | ORAL | Status: DC | PRN

## 2021-03-24 MED ORDER — IPRATROPIUM-ALBUTEROL 0.5-2.5 (3) MG/3ML IN SOLN: 3.0000 mL | Freq: Four times a day (QID) | RESPIRATORY_TRACT | Status: DC | PRN

## 2021-03-24 MED ORDER — METHYLPREDNISOLONE SODIUM SUCC 40 MG IJ SOLR
40.0000 mg | Freq: Two times a day (BID) | INTRAMUSCULAR | Status: DC
  Administered 2021-03-24 – 2021-03-26 (×5): 40 mg via INTRAVENOUS
  Filled 2021-03-24 (×5): qty 1

## 2021-03-24 MED ORDER — LACTATED RINGERS IV BOLUS
500.0000 mL | Freq: Once | INTRAVENOUS | Status: AC
  Administered 2021-03-24: 500 mL via INTRAVENOUS

## 2021-03-24 MED ORDER — ACETAMINOPHEN 325 MG PO TABS: 650.0000 mg | ORAL_TABLET | ORAL | Status: DC | PRN

## 2021-03-24 MED ORDER — SODIUM CHLORIDE 0.9% FLUSH: 3.0000 mL | INTRAVENOUS | Status: DC | PRN

## 2021-03-24 MED ORDER — BUDESONIDE 0.25 MG/2ML IN SUSP: 0.2500 mg | Freq: Two times a day (BID) | RESPIRATORY_TRACT | Status: DC

## 2021-03-24 MED ORDER — TIROFIBAN (AGGRASTAT) BOLUS VIA INFUSION
INTRAVENOUS | Status: DC | PRN
  Administered 2021-03-24: 1667.5 ug via INTRAVENOUS

## 2021-03-24 MED ORDER — IPRATROPIUM-ALBUTEROL 0.5-2.5 (3) MG/3ML IN SOLN
3.0000 mL | RESPIRATORY_TRACT | Status: DC
  Administered 2021-03-24 – 2021-03-26 (×11): 3 mL via RESPIRATORY_TRACT
  Filled 2021-03-24 (×12): qty 3

## 2021-03-24 MED ORDER — CHLORHEXIDINE GLUCONATE CLOTH 2 % EX PADS
6.0000 | MEDICATED_PAD | Freq: Every day | CUTANEOUS | Status: DC
  Administered 2021-03-27: 6 via TOPICAL

## 2021-03-24 MED ORDER — INSULIN ASPART 100 UNIT/ML ~~LOC~~ SOLN: 0.0000 [IU] | Freq: Three times a day (TID) | SUBCUTANEOUS | Status: DC

## 2021-03-24 MED ORDER — FENTANYL 2500MCG IN NS 250ML (10MCG/ML) PREMIX INFUSION
25.0000 ug/h | INTRAVENOUS | Status: DC
  Administered 2021-03-24: 25 ug/h via INTRAVENOUS
  Filled 2021-03-24: qty 250

## 2021-03-24 MED ORDER — IOHEXOL 350 MG/ML SOLN: INTRAVENOUS | Status: DC | PRN

## 2021-03-24 MED ORDER — DEXTROSE IN LACTATED RINGERS 5 % IV SOLN: INTRAVENOUS | Status: DC

## 2021-03-24 MED ORDER — PROPOFOL 1000 MG/100ML IV EMUL
INTRAVENOUS | Status: AC
  Filled 2021-03-24: qty 100

## 2021-03-24 MED ORDER — VERAPAMIL HCL 2.5 MG/ML IV SOLN
INTRAVENOUS | Status: AC
  Filled 2021-03-24: qty 2

## 2021-03-24 MED ORDER — IOHEXOL 300 MG/ML  SOLN
INTRAMUSCULAR | Status: DC | PRN
  Administered 2021-03-24: 185 mL via INTRA_ARTERIAL

## 2021-03-24 MED ORDER — TIROFIBAN HCL IN NACL 5-0.9 MG/100ML-% IV SOLN
INTRAVENOUS | Status: AC | PRN
  Administered 2021-03-24: 0.075 ug/kg/min via INTRAVENOUS

## 2021-03-24 MED ORDER — SODIUM CHLORIDE 0.9 % IV SOLN: INTRAVENOUS | Status: DC

## 2021-03-24 MED ORDER — FENTANYL CITRATE (PF) 100 MCG/2ML IJ SOLN
INTRAMUSCULAR | Status: AC
  Administered 2021-03-24: 50 ug via INTRAVENOUS
  Filled 2021-03-24: qty 2

## 2021-03-24 MED ORDER — FENTANYL CITRATE (PF) 100 MCG/2ML IJ SOLN
50.0000 ug | Freq: Once | INTRAMUSCULAR | Status: AC
Start: 2021-03-24 — End: 2021-03-24

## 2021-03-24 MED ORDER — INSULIN ASPART 100 UNIT/ML ~~LOC~~ SOLN: 0.0000 [IU] | Freq: Every day | SUBCUTANEOUS | Status: DC

## 2021-03-24 MED ORDER — HEPARIN SODIUM (PORCINE) 1000 UNIT/ML IJ SOLN
INTRAMUSCULAR | Status: DC | PRN
  Administered 2021-03-24: 6000 [IU] via INTRAVENOUS

## 2021-03-24 MED ORDER — INSULIN ASPART 100 UNIT/ML ~~LOC~~ SOLN
0.0000 [IU] | SUBCUTANEOUS | Status: DC
  Administered 2021-03-24 – 2021-03-25 (×3): 2 [IU] via SUBCUTANEOUS
  Administered 2021-03-25: 1 [IU] via SUBCUTANEOUS
  Administered 2021-03-25: 3 [IU] via SUBCUTANEOUS
  Administered 2021-03-25: 5 [IU] via SUBCUTANEOUS
  Filled 2021-03-24 (×6): qty 1

## 2021-03-24 MED ORDER — SODIUM CHLORIDE 0.9 % IV SOLN
INTRAVENOUS | Status: AC | PRN
  Administered 2021-03-24: 250 mL via INTRAVENOUS

## 2021-03-24 MED ORDER — FENTANYL CITRATE (PF) 100 MCG/2ML IJ SOLN
INTRAMUSCULAR | Status: AC
  Filled 2021-03-24: qty 2

## 2021-03-24 MED ORDER — DOCUSATE SODIUM 50 MG/5ML PO LIQD
100.0000 mg | Freq: Two times a day (BID) | ORAL | Status: DC
  Administered 2021-03-24: 100 mg
  Filled 2021-03-24 (×2): qty 10

## 2021-03-24 MED ORDER — VERAPAMIL HCL 2.5 MG/ML IV SOLN
INTRAVENOUS | Status: DC | PRN
  Administered 2021-03-24: 10 mL via INTRA_ARTERIAL

## 2021-03-24 MED ORDER — LIDOCAINE HCL (PF) 1 % IJ SOLN
INTRAMUSCULAR | Status: DC | PRN
  Administered 2021-03-24: 2 mL

## 2021-03-24 MED ORDER — TIROFIBAN HCL IV 12.5 MG/250 ML: 0.0750 ug/kg/min | INTRAVENOUS | Status: AC

## 2021-03-24 MED ORDER — IPRATROPIUM-ALBUTEROL 0.5-2.5 (3) MG/3ML IN SOLN: 3.0000 mL | Freq: Four times a day (QID) | RESPIRATORY_TRACT | Status: DC

## 2021-03-24 MED ORDER — SUCCINYLCHOLINE CHLORIDE 20 MG/ML IJ SOLN
INTRAMUSCULAR | Status: AC | PRN
  Administered 2021-03-24: 200 mg via INTRAVENOUS

## 2021-03-24 MED ORDER — POLYETHYLENE GLYCOL 3350 17 G PO PACK
17.0000 g | PACK | Freq: Every day | ORAL | Status: DC
  Administered 2021-03-24: 17 g
  Filled 2021-03-24: qty 1

## 2021-03-24 MED ORDER — TICAGRELOR 90 MG PO TABS
ORAL_TABLET | ORAL | Status: DC | PRN
  Administered 2021-03-24: 180 mg

## 2021-03-24 MED ORDER — LABETALOL HCL 5 MG/ML IV SOLN: 10.0000 mg | INTRAVENOUS | Status: AC | PRN

## 2021-03-24 SURGICAL SUPPLY — 18 items
BALLN MINITREK RX 2.0X8 (BALLOONS) ×2
BALLN ~~LOC~~ TREK RX 3.0X12 (BALLOONS) ×2
BALLOON MINITREK RX 2.0X8 (BALLOONS) ×1 IMPLANT
BALLOON ~~LOC~~ TREK RX 3.0X12 (BALLOONS) ×1 IMPLANT
CATH EAGLE EYE PLAT IMAGING (CATHETERS) ×2 IMPLANT
CATH INFINITI JR4 5F (CATHETERS) ×2 IMPLANT
CATH VISTA GUIDE 6FR XBLAD3.5 (CATHETERS) ×2 IMPLANT
DEVICE RAD TR BAND REGULAR (VASCULAR PRODUCTS) ×2 IMPLANT
DRAPE BRACHIAL (DRAPES) ×2 IMPLANT
GLIDESHEATH SLEND SS 6F .021 (SHEATH) ×2 IMPLANT
GUIDEWIRE INQWIRE 1.5J.035X260 (WIRE) ×1 IMPLANT
GUIDEWIRE PRESS OMNI 185 ST (WIRE) IMPLANT
INQWIRE 1.5J .035X260CM (WIRE) ×2
KIT ENCORE 26 ADVANTAGE (KITS) ×2 IMPLANT
KIT MANI 3VAL PERCEP (MISCELLANEOUS) ×2 IMPLANT
PACK CARDIAC CATH (CUSTOM PROCEDURE TRAY) ×2 IMPLANT
STENT SYNERGY DES 3X12 (Permanent Stent) ×2 IMPLANT
WIRE ASAHI PROWATER 180CM (WIRE) ×2 IMPLANT

## 2021-03-24 NOTE — ED Provider Notes (Signed)
[facility] Emergency Department Provider Note   ____________________________________________   Event Date/Time   First MD Initiated Contact with Patient 03/24/21 [id number]     (approximate)  I have reviewed the triage vital signs and the nursing notes.   HISTORY  Chief Complaint Code STEMI    HPI Elaine Wright is a 65 y.o. female brought to the ED via EMS from home with a chief complaint of respiratory distress and STEMI.  Patient with a history of diabetes, GERD, OSA who became sick 2 weeks ago with cough and congestion.  She was seen by her PCP on 03/13/2021 and prescribed azithromycin and albuterol inhaler.  She was seen in the ED 03/21/2021 for same and started on prednisone.  Seen in the office again on 03/22/2021 with worsening clinical picture and started on DuoNebs.  No prior history of COPD or CHF.  Acutely short of breath with chest pain this morning.  EMS reports room air saturations in the low 80%'s.  EMS administered 4 baby aspirin, 2 nitroglycerin, 125 IV Solu-Medrol, 2 DuoNeb's on route to the ED.  Code STEMI was called in the field.     Past Medical History:  Diagnosis Date  . Arthritis   . Diabetes mellitus without complication ([place])   . GERD (gastroesophageal reflux disease)   . Sleep apnea     There are no problems to display for this patient.   Past Surgical History:  Procedure Laterality Date  . ABDOMINAL HYSTERECTOMY  2003  . ANTERIOR AND POSTERIOR REPAIR N/A 04/14/2020   Procedure: ANTERIOR (CYSTOCELE) AND POSTERIOR REPAIR (RECTOCELE);  Surgeon: Schermerhorn, Gwen Her, MD;  Location: [facility] OR[facility];  Service: Gynecology;  Laterality: N/A;  . EYE SURGERY  2011   Cataract bil  . PARTIAL KNEE ARTHROPLASTY  2009   right   . Thumb joint     Tendon from arm to replace arthrititic thumb  . TOTAL KNEE ARTHROPLASTY  12/23/2012   Procedure: TOTAL KNEE ARTHROPLASTY;  Surgeon: Ninetta Lights, MD;  Location: [place];  Service: Orthopedics;  Laterality:  Left;  . TRACHELECTOMY N/A 04/14/2020   Procedure: LAPAROSCOPIC TRACHELECTOMY;  Surgeon: Schermerhorn, Gwen Her, MD;  Location: [facility] OR[facility];  Service: Gynecology;  Laterality: N/A;    Prior to Admission medications   Medication Sig Start Date End Date Taking? Authorizing Provider  albuterol (VENTOLIN HFA) 108 (90 Base) MCG/ACT inhaler Inhale 2 puffs into the lungs every 6 (six) hours as needed for wheezing or shortness of breath. 03/21/21  Yes Blake Divine, MD  ipratropium-albuterol (DUONEB) 0.5-2.5 (3) MG/3ML SOLN USE 1 VIAL VIA NEBULIZER FOUR TIMES DAILY AS DIRECTED 03/22/21  Yes [provider]  metFORMIN (GLUCOPHAGE-XR) 500 MG 24 hr tablet Take 1,000 mg by mouth daily. afternoon 03/04/20  Yes [provider]  metroNIDAZOLE (METROCREAM) 0.75 % cream Apply topically at bedtime. qhs to face for Rosacea 02/05/21 02/05/22 Yes Brendolyn Patty, MD  predniSONE (DELTASONE) 20 MG tablet Take 3 tablets (60 mg total) by mouth daily with breakfast for 5 days. 03/21/21 03/26/21 Yes Blake Divine, MD  omeprazole (PRILOSEC) 20 MG capsule Take 1 capsule by mouth daily. Patient not taking: Reported on 03/24/2021 03/21/21   [provider]    Allergies Morphine and related  Family History  Problem Relation Age of Onset  . Breast cancer Neg Hx     Social History Social History   Tobacco Use  . Smoking status: Current Every Day Smoker    Packs/day: 0.50  Years: 45.00    Pack years: 22.50    Types: Cigarettes  . Smokeless tobacco: Never Used  Vaping Use  . Vaping Use: Some days  . Substances: Nicotine, Flavoring  Substance Use Topics  . Alcohol use: Yes    Comment:  2 glasses of wine a month  . Drug use: No    Review of Systems  Constitutional: No fever/chills Eyes: No visual changes. ENT: No sore throat. Cardiovascular: Positive for chest pain. Respiratory: Positive for shortness of breath. Gastrointestinal: No abdominal pain.  No nausea, no vomiting.  No diarrhea.  No  constipation. Genitourinary: Negative for dysuria. Musculoskeletal: Negative for back pain. Skin: Negative for rash. Neurological: Negative for headaches, focal weakness or numbness.   ____________________________________________   PHYSICAL EXAM:  VITAL SIGNS: ED Triage Vitals [03/24/21 0639]  Enc Vitals Group     BP (!) 178/113     Pulse Rate (!) 117     Resp (!) 29     Temp      Temp src      SpO2 100 %     Weight      Height      Head Circumference      Peak Flow      Pain Score      Pain Loc      Pain Edu?      Excl. in [place]?     Constitutional: Alert and oriented.  Ill appearing and in moderate acute distress.  Speaking in 1-2 word sentences due to respiratory distress. Eyes: Conjunctivae are normal. PERRL. EOMI. Head: Atraumatic. Nose: No congestion/rhinnorhea. Mouth/Throat: Mucous membranes are mildly dry.   Neck: No stridor.   Cardiovascular: Tachycardic rate, regular rhythm. Grossly normal heart sounds.  Good peripheral circulation. Respiratory: Increased respiratory effort.  Retractions. Lungs diminished bilaterally. Gastrointestinal: Soft and nontender. No distention. No abdominal bruits. No CVA tenderness. Musculoskeletal: No lower extremity tenderness nor edema.  No joint effusions. Neurologic:  Normal speech and language. No gross focal neurologic deficits are appreciated.  Skin:  Skin is warm, dry and intact. No rash noted. Psychiatric: Mood and affect are normal. Speech and behavior are normal.  ____________________________________________   LABS (all labs ordered are listed, but only abnormal results are displayed)  Labs Reviewed  CBC WITH DIFFERENTIAL/PLATELET - Abnormal; Notable for the following components:      Result Value   WBC 26.2 (*)    RBC 5.19 (*)    All other components within normal limits  RESP PANEL BY RT-PCR (FLU A&B, COVID) ARPGX2  PROTIME-INR  COMPREHENSIVE METABOLIC PANEL  BRAIN NATRIURETIC PEPTIDE  APTT  PROTIME-INR   HEPARIN LEVEL (UNFRACTIONATED)  TROPONIN I (HIGH SENSITIVITY)   ____________________________________________  EKG  ED ECG REPORT I, Karyssa Amaral J, the attending physician, personally viewed and interpreted this ECG.   Date: 03/24/2021  EKG Time: 0639  Rate: 117  Rhythm: sinus tachycardia  Axis: LAD  Intervals:nonspecific intraventricular conduction delay  ST&T Change: Anterolateral STEMI  ____________________________________________  RADIOLOGY I, Oswin Johal J, personally viewed and evaluated these images (plain radiographs) as part of my medical decision making, as well as reviewing the written report by the radiologist.  ED MD interpretation: Pending  Official radiology report(s): No results found.  ____________________________________________   PROCEDURES  Procedure(s) performed (including Critical Care):  .1-3 Lead EKG Interpretation Performed by: Paulette Blanch, MD Authorized by: Paulette Blanch, MD     Interpretation: abnormal     ECG rate:  119   ECG rate assessment:  tachycardic     Rhythm: sinus tachycardia     Ectopy: none     Conduction: normal   Comments:     Placed on cardiac monitor to evaluate for arrhythmias Procedure Name: Intubation Date/Time: 03/24/2021 7:11 AM Performed by: Paulette Blanch, MD Pre-anesthesia Checklist: Patient identified, Patient being monitored, Emergency Drugs available, Timeout performed and Suction available Oxygen Delivery Method: Ambu bag Preoxygenation: Pre-oxygenation with 100% oxygen Induction Type: Rapid sequence Ventilation: Mask ventilation without difficulty Laryngoscope Size: Glidescope and 3 Tube size: 8.0 mm Number of attempts: 1 Airway Equipment and Method: Rigid stylet Placement Confirmation: ETT inserted through vocal cords under direct vision,  CO2 detector and Breath sounds checked- equal and bilateral       CRITICAL CARE Performed by: Paulette Blanch   Total critical care time: 30 minutes  Critical care  time was exclusive of separately billable procedures and treating other patients.  Critical care was necessary to treat or prevent imminent or life-threatening deterioration.  Critical care was time spent personally by me on the following activities: development of treatment plan with patient and/or surrogate as well as nursing, discussions with consultants, evaluation of patient's response to treatment, examination of patient, obtaining history from patient or surrogate, ordering and performing treatments and interventions, ordering and review of laboratory studies, ordering and review of radiographic studies, pulse oximetry and re-evaluation of patient's condition.  ____________________________________________   INITIAL IMPRESSION / ASSESSMENT AND PLAN / ED COURSE  As part of my medical decision making, I reviewed the following data within the [place] History obtained from family, Nursing notes reviewed and incorporated, Labs reviewed, EKG interpreted, Old chart reviewed, Radiograph reviewed, A consult was requested and obtained from this/these consultant(s) Cardiology and Notes from prior ED visits     65 year old female with recent URI and increasing difficulty breathing presenting with acute respiratory distress and STEMI. Differential diagnosis includes, but is not limited to, ACS, aortic dissection, pulmonary embolism, cardiac tamponade, pneumothorax, pneumonia, pericarditis, myocarditis, GI-related causes including esophagitis/gastritis, and musculoskeletal chest wall pain.    Code STEMI was called in the field by EMS.  Patient placed on BiPAP upon entering the treatment room.  Heparin bolus administered, nitroglycerin drip ordered.  Clinical Course as of 03/24/21 1448  Sat Mar 24, 2021  1856 Dr. Julianne Handler (STEMI cardiologist on-call) currently at bedside evaluating patient. [JS]  [id number] Patient intubated for severe respiratory distress.  Additionally, this will take  the stress of her heart and lungs and she would be able to lay flat for cardiac cath.  Updated husband and family room.  Patient en route to Cath Lab. [JS]    Clinical Course User Index [JS] Paulette Blanch, MD     ____________________________________________   FINAL CLINICAL IMPRESSION(S) / ED DIAGNOSES  Final diagnoses:  ST elevation myocardial infarction (STEMI), unspecified artery [facility])  Respiratory distress     ED Discharge Orders    None      *Please note:  Elaine Wright was evaluated in Emergency Department on 03/24/2021 for the symptoms described in the history of present illness. She was evaluated in the context of the global COVID-19 pandemic, which necessitated consideration that the patient might be at risk for infection with the SARS-CoV-2 virus that causes COVID-19. Institutional protocols and algorithms that pertain to the evaluation of patients at risk for COVID-19 are in a state of rapid change based on information released by regulatory bodies including the CDC and federal and state organizations. These policies and  algorithms were followed during the patient's care in the ED.  Some ED evaluations and interventions may be delayed as a result of limited staffing during and the pandemic.*   Note:  This document was prepared using Dragon voice recognition software and may include unintentional dictation errors.   Paulette Blanch, MD 03/24/21 [phone]

## 2021-03-24 NOTE — ED Notes (Signed)
RT at bedside upon pt arrival. Pt placed on bipap at this time.

## 2021-03-24 NOTE — ED Triage Notes (Addendum)
Pt BIBA via [facility] from home c/o of shortness of breath. Upon arrival O2 was 81% on RA, pt was place on cpap. EMS reports wheezing in all fields. Pt received 2 albuterol breathing treatments, 125mg  solumedrol. After pt calmed down, c/o of chest pain and tightness. Code STEMi called PTA. Pt received 324 mg aspirin and 2 nitro en route. Initial BP with ems, 170/110s.

## 2021-03-24 NOTE — Progress Notes (Signed)
Patient admitted from cath lab this morning, intubated. Received patient on NS, Aggrastat, and propofol infusions. Right radial approach with TR band in place. Per Dr. Patsey Berthold, propofol discontinued and precedex and fentanyl gtts started for sedation. Patient become progressively hypotensive on low dose sedation. 1L LR bolus total given per MD with mild BP improvement. Sedation stopped completely, patient tolerated for quite a while before becoming increasingly agitated with the ETT. Patient was extubated to BiPAP for ongoing SOB and notable expiratory wheezing. TR band was removed per protocol without any complications, site clean/dry/intact with gauze and tegaderm dressing in place.

## 2021-03-24 NOTE — ED Notes (Signed)
Cardiologist at bedside.  

## 2021-03-24 NOTE — Code Documentation (Signed)
24fr OG tube place by Sarita Haver, 54 at lip.

## 2021-03-24 NOTE — Progress Notes (Signed)
Provided emotional and spiritual support to [name], husband during STEMI. [name] reports the recent health events Elaine Wright has been experiencing that are noted in her chart. He was not expecting her to have heart concerns. She was the one who called 911 for help. He reports she was a long term smoker and breathing struggles have developed. [name] has two children whom he is in contact with and updating. Chaplain explained visitation policy. Prayer was offered for Elaine Wright, her family and those working with her. Chaplain services available upon request.     03/24/21 0800  Clinical Encounter Type  Visited With Patient;Family  Visit Type ED  Referral From Nurse  Spiritual Encounters  Spiritual Needs Prayer

## 2021-03-24 NOTE — Consult Note (Signed)
[place] for Heparin Indication: chest pain/ACS  Allergies  Allergen Reactions  . Morphine And Related Other (See Comments)    Confusion, hallucinations    Patient Measurements:   Heparin Dosing Weight: 66.7 kg  Vital Signs: Temp: 96.2 F (35.7 C) (04/09 0649) Temp Source: Axillary (04/09 0649) BP: 156/115 (04/09 0645) Pulse Rate: 119 (04/09 0648)  Labs: Recent Labs    03/21/21 1554 03/24/21 0643  HGB 13.8 14.6  HCT 42.4 45.0  PLT 284 378  CREATININE 0.65  --   TROPONINIHS 7  --     Estimated Creatinine Clearance: 59.8 mL/min (by C-G formula based on SCr of 0.65 mg/dL).   Medical History: Past Medical History:  Diagnosis Date  . Arthritis   . Diabetes mellitus without complication ([place])   . GERD (gastroesophageal reflux disease)   . Sleep apnea     Medications:  (Not in a hospital admission)  Scheduled:   Infusions:  . nitroGLYCERIN 40 mcg/min (03/24/21 0643)  . propofol 30 mcg/kg/min (03/24/21 0703)   PRN: etomidate, propofol, succinylcholine Anti-infectives (From admission, onward)   None      Assessment: Pharmacy consulted to start heparin for ACS. Plan for cath. No note of DOAC.   Goal of Therapy:  Heparin level 0.3-0.7 units/ml Monitor platelets by anticoagulation protocol: Yes   Plan:  Pt received heparin 4000 units bolus.  Start heparin infusion at 800 units/hr Check anti-Xa level in 6 hours and daily while on heparin Continue to monitor H&H and platelets  Oswald Hillock, PharmD, BCPS 03/24/2021,7:12 AM

## 2021-03-24 NOTE — Code Documentation (Signed)
Pt en route to cath lab at this time.

## 2021-03-24 NOTE — Consult Note (Signed)
PHARMACY CONSULT NOTE - FOLLOW UP  Pharmacy Consult for Electrolyte Monitoring and Replacement   Recent Labs: Potassium (mmol/L)  Date Value  03/24/2021 3.9   Calcium (mg/dL)  Date Value  03/24/2021 8.6 (L)   Albumin (g/dL)  Date Value  03/24/2021 4.5   Sodium (mmol/L)  Date Value  03/24/2021 137     Assessment: 65 y.o female with PMH as below presenting to [facility] ED today with chief complaints of shortness of breath and chest pain. S/p Cath 4/9 -Successful PTCA/DES x 1 proximal LAD. Currently intubated.    Goal of Therapy:  WNL  Plan:  No replacement needed at this time.  F/u with AM labs.   Oswald Hillock ,PharmD Clinical Pharmacist 03/24/2021 12:42 PM

## 2021-03-24 NOTE — Code Documentation (Signed)
8 fr tube placed, 22 at teeth, positive color change noted.

## 2021-03-24 NOTE — ED Notes (Signed)
Pt placed on zoll pads 

## 2021-03-24 NOTE — ED Notes (Signed)
Pt clothes removed and dressed in gown. Pt belongings placed in belongings bag.

## 2021-03-24 NOTE — Progress Notes (Signed)
Patient met criteria for liberation from the ventilator. She is awake asking for the ETT tube to be removed and agitated almost sitting upright on the bed. She passed SBT and was successfully extubated to BiPAP since she was still short of breath, hypoxic with increased work of breathing. Lungs with diffuse wheezing.  No stridors or s/s of respiratory distress noted. Once placed on BiPAP, sats improved to 100% and she was able to remain calm without obvious signs of respiratory distress. Remains sedated on Precedex gtt and PRN Fentanyl.   Rufina Falco, DNP, CCRN, FNP-C, AGACNP-BC Acute Care Nurse Practitioner  [facility] Pager: [phone] [place] at [facility]

## 2021-03-24 NOTE — Code Documentation (Signed)
Cardiologist verbal pt to be intubated due to not being able to lay on cath lab table with bipap during procedure.

## 2021-03-24 NOTE — Consult Note (Signed)
Cardiology Consultation:   Patient ID: Elaine Wright MRN: [id number]; DOB: 1956/01/05  Admit date: 03/24/2021 Date of Consult: 03/24/2021  PCP:  [name], MD   [place]  Cardiologist:  No primary care provider on file.    History of Present Illness:   Elaine Wright is a 65 yo female with history of tobacco abuse, COPD, arthritis, DM, GERD and sleep apnea presenting to the ED via EMS today with c/o chest pain. EKG with anterior STEMI. Code STEMI called by EMS. Pt still c/o ongoing chest pain in the ED. She is hypoxic and is being intubated by the ED staff. She tells me that she had onset of chest pain a week ago with acute worsening last night.    Past Medical History:  Diagnosis Date  . Arthritis   . Diabetes mellitus without complication ([place])   . GERD (gastroesophageal reflux disease)   . Sleep apnea     Past Surgical History:  Procedure Laterality Date  . ABDOMINAL HYSTERECTOMY  2003  . ANTERIOR AND POSTERIOR REPAIR N/A 04/14/2020   Procedure: ANTERIOR (CYSTOCELE) AND POSTERIOR REPAIR (RECTOCELE);  Surgeon: Schermerhorn, Gwen Her, MD;  Location: [facility] ORS;  Service: Gynecology;  Laterality: N/A;  . EYE SURGERY  2011   Cataract bil  . PARTIAL KNEE ARTHROPLASTY  2009   right   . Thumb joint     Tendon from arm to replace arthrititic thumb  . TOTAL KNEE ARTHROPLASTY  12/23/2012   Procedure: TOTAL KNEE ARTHROPLASTY;  Surgeon: Ninetta Lights, MD;  Location: [place];  Service: Orthopedics;  Laterality: Left;  . TRACHELECTOMY N/A 04/14/2020   Procedure: LAPAROSCOPIC TRACHELECTOMY;  Surgeon: Schermerhorn, Gwen Her, MD;  Location: [facility] ORS;  Service: Gynecology;  Laterality: N/A;     Home Medications:  Prior to Admission medications   Medication Sig Start Date End Date Taking? Authorizing Provider  albuterol (VENTOLIN HFA) 108 (90 Base) MCG/ACT inhaler Inhale 2 puffs into the lungs every 6 (six) hours as needed for wheezing or shortness of breath.  03/21/21   Blake Divine, MD  metFORMIN (GLUCOPHAGE-XR) 500 MG 24 hr tablet Take 1,000 mg by mouth daily. afternoon 03/04/20   [provider]  metroNIDAZOLE (METROCREAM) 0.75 % cream Apply topically at bedtime. qhs to face for Rosacea 02/05/21 02/05/22  Brendolyn Patty, MD  naproxen sodium (ALEVE) 220 MG tablet Take 220 mg by mouth daily as needed (pain).    [provider]  omeprazole (PRILOSEC OTC) 20 MG tablet Take 1 tablet (20 mg total) by mouth daily. 03/21/21 03/21/22  Blake Divine, MD  predniSONE (DELTASONE) 20 MG tablet Take 3 tablets (60 mg total) by mouth daily with breakfast for 5 days. 03/21/21 03/26/21  Blake Divine, MD    Inpatient Medications: Scheduled Meds:  Continuous Infusions: . propofol    . nitroGLYCERIN 40 mcg/min (03/24/21 0643)   PRN Meds:   Allergies:    Allergies  Allergen Reactions  . Morphine And Related Other (See Comments)    Confusion, hallucinations    Social History:   Social History   Socioeconomic History  . Marital status: Married    Spouse name: Not on file  . Number of children: Not on file  . Years of education: Not on file  . Highest education level: Not on file  Occupational History  . Not on file  Tobacco Use  . Smoking status: Current Every Day Smoker    Packs/day: 0.50    Years: 45.00  Pack years: 22.50    Types: Cigarettes  . Smokeless tobacco: Never Used  Vaping Use  . Vaping Use: Some days  . Substances: Nicotine, Flavoring  Substance and Sexual Activity  . Alcohol use: Yes    Comment:  2 glasses of wine a month  . Drug use: No  . Sexual activity: Not on file  Other Topics Concern  . Not on file  Social History Narrative  . Not on file   Social Determinants of Health   Financial Resource Strain: Not on file  Food Insecurity: Not on file  Transportation Needs: Not on file  Physical Activity: Not on file  Stress: Not on file  Social Connections: Not on file  Intimate Partner Violence: Not on  file    Family History:   No history of CAD.   ROS:  Please see the history of present illness.  All other ROS reviewed and negative.     Physical Exam/Data:   Vitals:   03/24/21 0639 03/24/21 0645 03/24/21 0648 03/24/21 0649  BP: (!) 178/113 (!) 156/115    Pulse: (!) 117 (!) 118 (!) 119   Resp: (!) 29 (!) 30 (!) 31   Temp:    (!) 96.2 F (35.7 C)  TempSrc:    Axillary  SpO2: 100% 100% 100%    No intake or output data in the 24 hours ending 03/24/21 0656 Last 3 Weights 03/21/2021 02/07/2021 04/14/2020  Weight (lbs) 147 lb 145 lb 145 lb 1 oz  Weight (kg) 66.679 kg 65.772 kg 65.8 kg     There is no height or weight on file to calculate BMI.  General:  Well nourished, well developed, in acute distress HEENT: normal Lymph: no adenopathy Neck: no JVD Endocrine:  No thryomegaly Vascular: No carotid bruits; FA pulses 2+ bilaterally without bruits  Cardiac:  normal S1, S2; RRR; no murmur  Lungs:  Diffuse wheezes.   Abd: soft, nontender, no hepatomegaly  Ext: no edema Musculoskeletal:  No deformities, BUE and BLE strength normal and equal Skin: warm and dry  Neuro:  CNs 2-12 intact, no focal abnormalities noted Psych:  Normal affect   EKG:  The EKG was personally reviewed and demonstrates:  Sinus tachycardia, anterolateral ST elevation Telemetry:  Telemetry was personally reviewed and demonstrates:  Sinus tachycardia  Relevant CV Studies:  Laboratory Data:  High Sensitivity Troponin:   Recent Labs  Lab 03/21/21 1554  TROPONINIHS 7     Chemistry Recent Labs  Lab 03/21/21 1554  NA 138  K 4.2  CL 105  CO2 24  GLUCOSE 111*  BUN 17  CREATININE 0.65  CALCIUM 9.2  GFRNONAA >60  ANIONGAP 9    Recent Labs  Lab 03/21/21 1554  PROT 7.2  ALBUMIN 4.5  AST 23  ALT 20  ALKPHOS 68  BILITOT 0.7   Hematology Recent Labs  Lab 03/21/21 1554  WBC 12.1*  RBC 4.91  HGB 13.8  HCT 42.4  MCV 86.4  MCH 28.1  MCHC 32.5  RDW 13.7  PLT 284   BNPNo results for  input(s): BNP, PROBNP in the last 168 hours.  DDimer No results for input(s): DDIMER in the last 168 hours.   Radiology/Studies:  DG Chest 2 View  Result Date: 03/21/2021 CLINICAL DATA:  65 year old female with shortness of breath. EXAM: CHEST - 2 VIEW COMPARISON:  Chest CT dated 02/07/2021. FINDINGS: Right infrahilar density may represent atelectasis or crowding of the hilar structure. Developing infiltrate is less likely but not excluded  clinical correlation is recommended. No pleural effusion, or pneumothorax. The cardiac silhouette is within limits. Atherosclerotic calcification of the aorta. Degenerative changes of the spine. No acute osseous pathology. IMPRESSION: Right infrahilar atelectasis versus less likely developing infiltrate. Clinical correlation is recommended. Electronically Signed   By: Anner Crete M.D.   On: 03/21/2021 16:55   CT Soft Tissue Neck W Contrast  Result Date: 03/21/2021 CLINICAL DATA:  Shortness of breath and throat tightness. Cough for 2 weeks. EXAM: CT NECK WITH CONTRAST TECHNIQUE: Multidetector CT imaging of the neck was performed using the standard protocol following the bolus administration of intravenous contrast. CONTRAST:  44mL OMNIPAQUE IOHEXOL 300 MG/ML  SOLN COMPARISON:  None. FINDINGS: Pharynx and larynx: No evidence of mass or swelling. No fluid collection or inflammatory changes in the parapharyngeal or retropharyngeal spaces. Salivary glands: Asymmetric fatty atrophy of the left submandibular gland. 6 mm stone in the distal left submandibular duct. Unremarkable appearance of the right submandibular and both parotid glands. Thyroid: Unremarkable. Lymph nodes: No enlarged or suspicious lymph nodes in the neck. Vascular: Major vascular structures of the neck are grossly patent. Limited intracranial: Unremarkable. Visualized orbits: Bilateral cataract extraction. Mastoids and visualized paranasal sinuses: Mild mucosal thickening in the maxillary sinuses with  bubbly secretions on the right. Clear mastoid air cells. Skeleton: Moderate mid and lower cervical disc degeneration. Mild cervical facet arthrosis. Upper chest: Clear lung apices. Other: None. IMPRESSION: 1. No acute abnormality identified in the neck. 2. 6 mm stone in the left submandibular duct with submandibular gland atrophy. Electronically Signed   By: Logan Bores M.D.   On: 03/21/2021 19:20     Assessment and Plan:   1. Acute anterior STEMI: Will plan emergent cardiac cath. Further plans to follow.     Signed, Lauree Chandler, MD  03/24/2021 6:56 AM

## 2021-03-24 NOTE — H&P (Addendum)
NAME:  Elaine Wright, MRN:  [id number], DOB:  10/12/1956, LOS: 0 ADMISSION DATE:  03/24/2021, CONSULTATION DATE:  03/24/2021 REFERRING MD:  Lauree Chandler MD, CHIEF COMPLAINT:  Chest Pain  History of Present Illness:  Elaine Wright is a 65 y.o female with PMH as below presenting to [facility] ED today with chief complaints of shortness of breath and chest pain.  Patient is currently intubated and sedated therefore unable to obtain history of presenting illness.  Per ED notes, patient became sick 2 weeks ago with worsening cough and congestion.  She was seen by her PCP on 03/14/2019 and prescribed azithromycin and albuterol inhaler without significant improvement.  She presented to the ED on 03/22/2019 2020 for worsening symptoms and was started on prednisone.  She followed up with her PCP on 06/22/2019 with worsening clinical picture and was started on duo nebs.  Per patient's husband symptoms got worse this morning associated with chest pain she therefore presented to the ED for further evaluation.  Per EMS report, patient was noted with significant wheezing in all lung fields for which she received 2 doses of albuterol treatment, 125 of Solu-Medrol code STEMI was called on route for worsening chest pain, patient received 325 mg of aspirin and 2 of nitro on route.  On arrival to the ED, she was afebrile with blood pressure 173/113 mm Hg, pulse rate 117 beats/min, RR 30 and oxygen saturation 81% on room air, patient was placed on BiPAP.  Stat EKG was obtained which showed anterolateral ST elevation consistent with a STEMI. Cardiology was consulted for code STEMI who recommended patient to be intubated due to not being able to lay on cath lab table on BiPAP. Patient taken to emergent Cath Lab for intervention and subsequently transferred to the ICU intubated.  PCCM consulted for further management  Pertinent  Medical History  Type 2 diabetes mellitus (CMS-HCC)  Osteoarthritis  Obstructive sleep apnea   COPD Other osteoporosis without current pathological fracture  Moderate tobacco use disorder, in early remission - 10 cpd x 45 yrs  GERD  Significant Hospital Events: Including procedures, antibiotic start and stop dates in addition to other pertinent events   03/24/21: Presenting to the ED with acute chest pain. EKG with anterior STEMI. Code STEMI called and patient taken to cath Lab 03/24/21: Admitted to ICU s/p   Significant Diagnostic Tests:  4/9 Chest Xray>  Micro Data:  N/A  Antimicrobials:  N/A  Consults:  Cardiology  Procedures:  03/24/21: Intubation  Interim History / Subjective:  -Patient remain intubated and sedated  Objective   Blood pressure (!) 156/115, pulse (!) 119, temperature (!) 96.2 F (35.7 C), temperature source Axillary, resp. rate (!) 31, SpO2 100 %.    Vent Mode: AC FiO2 (%):  [100 %] 100 % Set Rate:  [16 bmp] 16 bmp Vt Set:  [400 mL] 400 mL PEEP:  [5 cmH20] 5 cmH20  No intake or output data in the 24 hours ending 03/24/21 0745 There were no vitals filed for this visit.  Physical Examination: GENERAL: 65 year-old critically ill patient lying in the bed intubated, mechanically ventillated and sedated  EYES: Pupils equal, round, reactive to light and accommodation. No scleral icterus. Extraocular muscles intact.  HEENT: Head atraumatic, normocephalic. Unable to assesOropharynx and nasopharynx due to presence of ETT.  NECK:  Supple, no jugular venous distention. No thyroid enlargement, no tenderness.  LUNGS: Normal breath sounds bilaterally, no wheezing, rales,rhonchi or crepitation. No use of accessory muscles of respiration.  CARDIOVASCULAR: S1, S2 normal. No murmurs, rubs, or gallops.  ABDOMEN: Soft, nontender, nondistended. Bowel sounds present. No organomegaly or mass.  EXTREMITIES: No pedal edema, cyanosis, or clubbing.  NEUROLOGIC: Unable to assess Cranial nerves II through XII or  Muscle strength. Sensation intact. Gait not checked.   PSYCHIATRIC: unable to assess SKIN: No obvious rash, lesion, or ulcer.   Resolved Hospital Problem list   None  Assessment & Plan:   Acute Hypoxic / Hypercapnic Respiratory Failure in the setting of ACOPD exacerbation  PMHx: COPD, tobacco use, Obstructive Sleep Apnea - Ventilator settings: PRVC  8 mL/kg, 50  50% FiO2, 5 PEEP, continue ventilator support & lung protective strategies - Wean PEEP & FiO2 as tolerated, maintain SpO2 > 90% - Head of bed elevated 30 degrees, VAP protocol in place - Plateau pressures less than 30 cm H20  - Intermittent chest x-ray & ABG PRN - Daily WUA with SBT as tolerated  - Ensure adequate pulmonary hygiene  - F/u cultures, trend PCT - Steroids initiated: solu-medrol 40 mg BID - Duonebs Q4, bronchodilators PRN - PAD protocol in place: continue Fentanyl drip & Precedex drip - Smoking cessation education once stabilized   STEMI likely in the setting of ACOPD exacerbation leading to systemic inflammatory response and oxidative stress on the myocardium. EKG 12 lead performed: Anterolateral ST elevation S/p cardiac cath with stent to LAD  - TTEcho to assess LV Function  - Trend troponins  - Continue anti platelet: Ticagrelor - Start high dose statin Atorvastatin 80 mg daily - ASA 81mg  PO daily - Continuous cardiac monitoring - consider NM stress test once stabilized - f/u A1c, TSH & thyroid panel, lipid panel - Cardiology following, appreciate input    Acute Metabolic Encephalopathy due to above -Provide supportive care    Diabetes mellitus -CBGs -Sliding scale insulin -Follow ICU hyper/hypoglycemia protocol -Hold home Metformin    Best practice (right click and "Reselect all SmartList Selections" daily)  Diet:  NPO Pain/Anxiety/Delirium protocol (if indicated): Yes (RASS goal 0) VAP protocol (if indicated): Yes DVT prophylaxis: LMWH GI prophylaxis: PPI Glucose control:  SSI No Central venous access:  N/A Arterial line:  N/A Foley:   Yes, and it is still needed Mobility:  bed rest  PT consulted: N/A Last date of multidisciplinary goals of care discussion [03/23/2021] Code Status:  full code Disposition: ICU  Labs/imaging that I havepersonally reviewed  (right click and "Reselect all SmartList Selections" daily)    Labs   CBC: Recent Labs  Lab 03/21/21 1554 03/24/21 0643  WBC 12.1* 26.2*  NEUTROABS  --  19.5*  HGB 13.8 14.6  HCT 42.4 45.0  MCV 86.4 86.7  PLT 284 765    Basic Metabolic Panel: Recent Labs  Lab 03/21/21 1554 03/24/21 0643  NA 138 137  K 4.2 3.9  CL 105 100  CO2 24 28  GLUCOSE 111* 257*  BUN 17 23  CREATININE 0.65 0.65  CALCIUM 9.2 8.6*   GFR: Estimated Creatinine Clearance: 59.8 mL/min (by C-G formula based on SCr of 0.65 mg/dL). Recent Labs  Lab 03/21/21 1554 03/24/21 0643  WBC 12.1* 26.2*    Liver Function Tests: Recent Labs  Lab 03/21/21 1554 03/24/21 0643  AST 23 29  ALT 20 25  ALKPHOS 68 73  BILITOT 0.7 0.6  PROT 7.2 8.1  ALBUMIN 4.5 4.5   No results for input(s): LIPASE, AMYLASE in the last 168 hours. No results for input(s): AMMONIA in the last 168 hours.  ABG No results  found for: PHART, PCO2ART, PO2ART, HCO3, TCO2, ACIDBASEDEF, O2SAT   Coagulation Profile: Recent Labs  Lab 03/24/21 0643  INR 1.0    Cardiac Enzymes: No results for input(s): CKTOTAL, CKMB, CKMBINDEX, TROPONINI in the last 168 hours.  HbA1C: No results found for: HGBA1C  CBG: No results for input(s): GLUCAP in the last 168 hours.  Review of Systems:   UNABLE TO ASSESS DUE TO CRITICAL ILLNESS ON THE VENT AND SEDATED  Past Medical History:  She,  has a past medical history of Arthritis, Diabetes mellitus without complication ([place]), GERD (gastroesophageal reflux disease), and Sleep apnea.   Surgical History:   Past Surgical History:  Procedure Laterality Date  . ABDOMINAL HYSTERECTOMY  2003  . ANTERIOR AND POSTERIOR REPAIR N/A 04/14/2020   Procedure: ANTERIOR (CYSTOCELE)  AND POSTERIOR REPAIR (RECTOCELE);  Surgeon: Schermerhorn, Gwen Her, MD;  Location: [facility] ORS;  Service: Gynecology;  Laterality: N/A;  . EYE SURGERY  2011   Cataract bil  . PARTIAL KNEE ARTHROPLASTY  2009   right   . Thumb joint     Tendon from arm to replace arthrititic thumb  . TOTAL KNEE ARTHROPLASTY  12/23/2012   Procedure: TOTAL KNEE ARTHROPLASTY;  Surgeon: Ninetta Lights, MD;  Location: [place];  Service: Orthopedics;  Laterality: Left;  . TRACHELECTOMY N/A 04/14/2020   Procedure: LAPAROSCOPIC TRACHELECTOMY;  Surgeon: Schermerhorn, Gwen Her, MD;  Location: [facility] ORS;  Service: Gynecology;  Laterality: N/A;     Social History:   reports that she has been smoking cigarettes. She has a 22.50 pack-year smoking history. She has never used smokeless tobacco. She reports current alcohol use. She reports that she does not use drugs.   Family History:  Her family history is negative for Breast cancer.   Allergies Allergies  Allergen Reactions  . Morphine And Related Other (See Comments)    Confusion, hallucinations     Home Medications  Prior to Admission medications   Medication Sig Start Date End Date Taking? Authorizing Provider  albuterol (VENTOLIN HFA) 108 (90 Base) MCG/ACT inhaler Inhale 2 puffs into the lungs every 6 (six) hours as needed for wheezing or shortness of breath. 03/21/21  Yes Blake Divine, MD  ipratropium-albuterol (DUONEB) 0.5-2.5 (3) MG/3ML SOLN USE 1 VIAL VIA NEBULIZER FOUR TIMES DAILY AS DIRECTED 03/22/21  Yes [provider]  metFORMIN (GLUCOPHAGE-XR) 500 MG 24 hr tablet Take 1,000 mg by mouth daily. afternoon 03/04/20  Yes [provider]  metroNIDAZOLE (METROCREAM) 0.75 % cream Apply topically at bedtime. qhs to face for Rosacea 02/05/21 02/05/22 Yes Brendolyn Patty, MD  predniSONE (DELTASONE) 20 MG tablet Take 3 tablets (60 mg total) by mouth daily with breakfast for 5 days. 03/21/21 03/26/21 Yes Blake Divine, MD  omeprazole (PRILOSEC) 20 MG capsule  Take 1 capsule by mouth daily. Patient not taking: Reported on 03/24/2021 03/21/21   [provider]     Critical care time: [place], DNP,CCRN, FNP-C, AGACNP-BC Acute Care Nurse Practitioner  [facility] Pager: [phone] [place] at [facility]

## 2021-03-24 NOTE — Code Documentation (Signed)
57fr temp foley in, palced by Delilah Shan RN and assisted with Larene Beach RN

## 2021-03-25 ENCOUNTER — Inpatient Hospital Stay (HOSPITAL_COMMUNITY)
Admit: 2021-03-25 | Discharge: 2021-03-25 | Disposition: A | Discharge status: Home / Self Care | Payer: Medicare Other | Attending: Cardiovascular Disease | Admitting: Cardiovascular Disease

## 2021-03-25 DIAGNOSIS — I2510 Atherosclerotic heart disease of native coronary artery without angina pectoris: Secondary | ICD-10-CM | POA: Diagnosis not present

## 2021-03-25 DIAGNOSIS — J9601 Acute respiratory failure with hypoxia: Secondary | ICD-10-CM

## 2021-03-25 DIAGNOSIS — J9602 Acute respiratory failure with hypercapnia: Secondary | ICD-10-CM

## 2021-03-25 DIAGNOSIS — J441 Chronic obstructive pulmonary disease with (acute) exacerbation: Secondary | ICD-10-CM

## 2021-03-25 DIAGNOSIS — I509 Heart failure, unspecified: Secondary | ICD-10-CM

## 2021-03-25 LAB — BRAIN NATRIURETIC PEPTIDE: B Natriuretic Peptide: 620.9 pg/mL — ABNORMAL HIGH (ref 0.0–100.0)

## 2021-03-25 LAB — CBC
HCT: 36.8 % (ref 36.0–46.0)
Hemoglobin: 11.8 g/dL — ABNORMAL LOW (ref 12.0–15.0)
MCH: 28 pg (ref 26.0–34.0)
MCHC: 32.1 g/dL (ref 30.0–36.0)
MCV: 87.4 fL (ref 80.0–100.0)
Platelets: 275 10*3/uL (ref 150–400)
RBC: 4.21 MIL/uL (ref 3.87–5.11)
RDW: 13.9 % (ref 11.5–15.5)
WBC: 20.6 10*3/uL — ABNORMAL HIGH (ref 4.0–10.5)
nRBC: 0 % (ref 0.0–0.2)

## 2021-03-25 LAB — HEPATIC FUNCTION PANEL
ALT: 24 U/L (ref 0–44)
AST: 39 U/L (ref 15–41)
Albumin: 3.3 g/dL — ABNORMAL LOW (ref 3.5–5.0)
Alkaline Phosphatase: 52 U/L (ref 38–126)
Bilirubin, Direct: <0.1 mg/dL (ref 0.0–0.2)
Total Bilirubin: 0.5 mg/dL (ref 0.3–1.2)
Total Protein: 6.1 g/dL — ABNORMAL LOW (ref 6.5–8.1)

## 2021-03-25 LAB — GLUCOSE, CAPILLARY
Glucose-Capillary: 100 mg/dL — ABNORMAL HIGH (ref 70–99)
Glucose-Capillary: 146 mg/dL — ABNORMAL HIGH (ref 70–99)
Glucose-Capillary: 166 mg/dL — ABNORMAL HIGH (ref 70–99)
Glucose-Capillary: 179 mg/dL — ABNORMAL HIGH (ref 70–99)
Glucose-Capillary: 215 mg/dL — ABNORMAL HIGH (ref 70–99)
Glucose-Capillary: 284 mg/dL — ABNORMAL HIGH (ref 70–99)

## 2021-03-25 LAB — MAGNESIUM: Magnesium: 2 mg/dL (ref 1.7–2.4)

## 2021-03-25 LAB — ECHOCARDIOGRAM COMPLETE
AR max vel: 2.09 cm2
AV Peak grad: 9.2 mmHg
Ao pk vel: 1.52 m/s
Area-P 1/2: 5.34 cm2
S' Lateral: 3.11 cm
Single Plane A4C EF: 55.8 %

## 2021-03-25 LAB — BASIC METABOLIC PANEL
Anion gap: 11 (ref 5–15)
BUN: 19 mg/dL (ref 8–23)
CO2: 24 mmol/L (ref 22–32)
Calcium: 8.3 mg/dL — ABNORMAL LOW (ref 8.9–10.3)
Chloride: 107 mmol/L (ref 98–111)
Creatinine, Ser: 0.55 mg/dL (ref 0.44–1.00)
GFR, Estimated: >60 mL/min (ref >60)
Glucose, Bld: 158 mg/dL — ABNORMAL HIGH (ref 70–99)
Potassium: 4.3 mmol/L (ref 3.5–5.1)
Sodium: 142 mmol/L (ref 135–145)

## 2021-03-25 LAB — PHOSPHORUS: Phosphorus: 2.4 mg/dL — ABNORMAL LOW (ref 2.5–4.6)

## 2021-03-25 MED ORDER — K PHOS MONO-SOD PHOS DI & MONO 155-852-130 MG PO TABS
250.0000 mg | ORAL_TABLET | ORAL | Status: AC
  Administered 2021-03-25 (×2): 250 mg via ORAL
  Filled 2021-03-25 (×2): qty 1

## 2021-03-25 MED ORDER — ENOXAPARIN SODIUM 40 MG/0.4ML ~~LOC~~ SOLN
40.0000 mg | SUBCUTANEOUS | Status: DC
  Administered 2021-03-25 – 2021-03-26 (×2): 40 mg via SUBCUTANEOUS
  Filled 2021-03-25 (×2): qty 0.4

## 2021-03-25 MED ORDER — INSULIN ASPART 100 UNIT/ML ~~LOC~~ SOLN
0.0000 [IU] | SUBCUTANEOUS | Status: DC
  Administered 2021-03-25: 3 [IU] via SUBCUTANEOUS
  Filled 2021-03-25: qty 1

## 2021-03-25 MED ORDER — FUROSEMIDE 10 MG/ML IJ SOLN
20.0000 mg | Freq: Once | INTRAMUSCULAR | Status: AC
  Administered 2021-03-25: 20 mg via INTRAVENOUS
  Filled 2021-03-25: qty 2

## 2021-03-25 MED ORDER — HYDROCOD POLST-CPM POLST ER 10-8 MG/5ML PO SUER
5.0000 mL | Freq: Two times a day (BID) | ORAL | Status: DC | PRN
  Administered 2021-03-25 – 2021-03-26 (×3): 5 mL via ORAL
  Filled 2021-03-25 (×3): qty 5

## 2021-03-25 MED ORDER — DM-GUAIFENESIN ER 30-600 MG PO TB12
1.0000 | ORAL_TABLET | Freq: Two times a day (BID) | ORAL | Status: DC
  Administered 2021-03-25 – 2021-03-27 (×5): 1 via ORAL
  Filled 2021-03-25 (×5): qty 1

## 2021-03-25 NOTE — Progress Notes (Addendum)
Progress Note  Patient Name: Elaine Wright Date of Encounter: 03/25/2021  Primary Cardiologist: New  Subjective   No chest pain or sob this morning.  Off of BiPAP and on [place].  Inpatient Medications    Scheduled Meds: . aspirin  81 mg Per Tube Daily  . atorvastatin  80 mg Oral Daily  . Chlorhexidine Gluconate Cloth  6 each Topical Daily  . dextromethorphan-guaiFENesin  1 tablet Oral BID  . enoxaparin (LOVENOX) injection  40 mg Subcutaneous Q24H  . insulin aspart  0-9 Units Subcutaneous Q4H  . ipratropium-albuterol  3 mL Nebulization Q4H  . methylPREDNISolone (SOLU-MEDROL) injection  40 mg Intravenous Q12H  . phosphorus  250 mg Oral Q4H  . sodium chloride flush  3 mL Intravenous Q12H  . ticagrelor  90 mg Per Tube BID   Continuous Infusions: . sodium chloride     PRN Meds: sodium chloride, acetaminophen, docusate sodium, ipratropium-albuterol, ondansetron (ZOFRAN) IV, polyethylene glycol, sodium chloride flush   Vital Signs    Vitals:   03/25/21 0749 03/25/21 0800 03/25/21 0900 03/25/21 1000  BP:  101/72 113/75 121/87  Pulse: 81 69 94 92  Resp:  18 (!) 25 (!) 26  Temp:   99 F (37.2 C)   TempSrc:      SpO2: 98% 97% 94% 97%    Intake/Output Summary (Last 24 hours) at 03/25/2021 1250 Last data filed at 03/25/2021 0700 Gross per 24 hour  Intake 1332.3 ml  Output 915 ml  Net 417.3 ml   There were no vitals filed for this visit.  Physical Exam   GEN: Well nourished, well developed, in no acute distress.  HEENT: Grossly normal.  Neck: Supple, mildly elev JVD, carotid bruits, or masses. Cardiac: RRR, 2/6 SEM loudest @ upper sternal borders.  No murmurs, rubs, or gallops. No clubbing, cyanosis, edema.  Radials 2+, DP/PT 2+ and equal bilaterally. R radial cath site w/o bleeding/bruit/hematoma.  Respiratory:  Respirations regular and unlabored, diminished breath sounds @ bilat bases.  Anterior exp wheezing. GI: Soft, nontender, nondistended, BS + x 4. MS: no  deformity or atrophy. Skin: warm and dry, no rash. Neuro:  Strength and sensation are intact. Psych: AAOx3.  Normal affect.  Labs    Chemistry Recent Labs  Lab 03/21/21 1554 03/24/21 0643 03/25/21 0400  NA 138 137 142  K 4.2 3.9 4.3  CL 105 100 107  CO2 24 28 24   GLUCOSE 111* 257* 158*  BUN 17 23 19   CREATININE 0.65 0.65 0.55  CALCIUM 9.2 8.6* 8.3*  PROT 7.2 8.1 6.1*  ALBUMIN 4.5 4.5 3.3*  AST 23 29 39  ALT 20 25 24   ALKPHOS 68 73 52  BILITOT 0.7 0.6 0.5  GFRNONAA >60 >60 >60  ANIONGAP 9 9 11      Hematology Recent Labs  Lab 03/21/21 1554 03/24/21 0643 03/25/21 0400  WBC 12.1* 26.2* 20.6*  RBC 4.91 5.19* 4.21  HGB 13.8 14.6 11.8*  HCT 42.4 45.0 36.8  MCV 86.4 86.7 87.4  MCH 28.1 28.1 28.0  MCHC 32.5 32.4 32.1  RDW 13.7 13.6 13.9  PLT 284 378 275    Cardiac Enzymes  Recent Labs  Lab 03/21/21 1554 03/24/21 0643 03/24/21 0924  TROPONINIHS 7 60* 1,126*      BNP Recent Labs  Lab 03/24/21 0643 03/25/21 0408  BNP 56.7 620.9*     HbA1c  Lab Results  Component Value Date   HGBA1C 6.9 (H) 03/24/2021    Radiology  DG Chest 2 View  Result Date: 03/21/2021 CLINICAL DATA:  65 year old female with shortness of breath. EXAM: CHEST - 2 VIEW COMPARISON:  Chest CT dated 02/07/2021. FINDINGS: Right infrahilar density may represent atelectasis or crowding of the hilar structure. Developing infiltrate is less likely but not excluded clinical correlation is recommended. No pleural effusion, or pneumothorax. The cardiac silhouette is within limits. Atherosclerotic calcification of the aorta. Degenerative changes of the spine. No acute osseous pathology. IMPRESSION: Right infrahilar atelectasis versus less likely developing infiltrate. Clinical correlation is recommended. Electronically Signed   By: Anner Crete M.D.   On: 03/21/2021 16:55   CT Soft Tissue Neck W Contrast  Result Date: 03/21/2021 CLINICAL DATA:  Shortness of breath and throat tightness. Cough  for 2 weeks. EXAM: CT NECK WITH CONTRAST TECHNIQUE: Multidetector CT imaging of the neck was performed using the standard protocol following the bolus administration of intravenous contrast. CONTRAST:  65mL OMNIPAQUE IOHEXOL 300 MG/ML  SOLN COMPARISON:  None. FINDINGS: Pharynx and larynx: No evidence of mass or swelling. No fluid collection or inflammatory changes in the parapharyngeal or retropharyngeal spaces. Salivary glands: Asymmetric fatty atrophy of the left submandibular gland. 6 mm stone in the distal left submandibular duct. Unremarkable appearance of the right submandibular and both parotid glands. Thyroid: Unremarkable. Lymph nodes: No enlarged or suspicious lymph nodes in the neck. Vascular: Major vascular structures of the neck are grossly patent. Limited intracranial: Unremarkable. Visualized orbits: Bilateral cataract extraction. Mastoids and visualized paranasal sinuses: Mild mucosal thickening in the maxillary sinuses with bubbly secretions on the right. Clear mastoid air cells. Skeleton: Moderate mid and lower cervical disc degeneration. Mild cervical facet arthrosis. Upper chest: Clear lung apices. Other: None. IMPRESSION: 1. No acute abnormality identified in the neck. 2. 6 mm stone in the left submandibular duct with submandibular gland atrophy. Electronically Signed   By: Logan Bores M.D.   On: 03/21/2021 19:20    DG Chest Port 1 View  Result Date: 03/24/2021 CLINICAL DATA:  Current history of COPD, diabetes and sleep apnea, presenting with acute onset of chest pain with EKG demonstrating an anterior STEMI. Hypoxemia. Intubation. EXAM: PORTABLE CHEST 1 VIEW COMPARISON:  03/21/2021 and earlier. FINDINGS: Endotracheal tube tip in satisfactory position projecting approximately 5 cm above the carina. Cardiac silhouette normal in size, unchanged. Lungs clear. Pulmonary vascularity normal. No visible pleural effusions. IMPRESSION: 1. Endotracheal tube tip in satisfactory position projecting  approximately 5 cm above the carina. 2. No acute cardiopulmonary disease. Electronically Signed   By: Evangeline Dakin M.D.   On: 03/24/2021 09:44    Telemetry    RSR - 70's to 90's - Personally Reviewed  ECG    4/9 @ 1212: RSR, 74, <61mm ST elev I and aVL w/ small q's - Personally Reviewed  Cardiac Studies   Cardiac Catheterization and Percutaneous Coronary Intervention 4.9.2022  Left Anterior Descending  Vessel is large.  Ost LAD to Prox LAD lesion is 80% stenosed. The lesion is eccentric. Ultrasound (IVUS) was performed. Minimum lumen diameter: 3 mm. Severe plaque burden was detected. IVUS has determined that the lesion is calcified.      **The ostial/proximal LAD was successfully treated w/ a 3.0 x 12 mm Synergy DES**  Left Circumflex  Vessel is large.  Right Coronary Artery  Vessel is large.  _____________  2D Echocardiogram - pending _____________   Patient Profile     65 y.o. female w/ a h/o tob abuse, COPD, OA, DM, GERD, and OSA, who was admitted 4/9  w/ chest pain, resp failure req intubation, and acute anterolateral STEMI. Now s/p PCI/DES to the ostial/prox LAD on 4/9.  Extubated to BiPAP 4/9.  Assessment & Plan    1.  Acute anterolateral STEMI/CAD:  Presented 4/9 w/ progressive dyspnea and chest tightness over a 10 day period.  Found to have anterolateral ST elevation and resp failure req intubation.  Cath revealed an 80% ost/prox LAD stenosis and otw nl cors.  She is no s/p PCI/DES to the ost/prox LAD. Extubated 4/9 and weaned off of BiPAP this AM.  No chest pain or dyspnea presently.  Echo pending.  Cont ASA, statin, brilinta.  Holding off on  blocker in setting of ongoing COPD flare req IV steroids and wheezing on exam.  When appropriate, will consider low-dose bisoprolol.  Pending echo, may need additional rx for ICM if BP allows.  2.  Acute resp failure/AECOPD:  Intubated on admission, extubated 4/9. Weaned off of BiPAP this AM.  Currently on [place].  Steroids/nebs per  pulm.  3.  Acute CHF:  JVD on exam.  Received lasix IV earlier.  Good UO.  Follow. Echo pending.  4.  HL:  Cont high potency statin rx. Notes that she tried lipitor prev but stopped due to upset stomach.  Will cont for now but can change to alternate if necessary.  F/u lipids in AM.  5.  DMII:  A1c 6.9.  On metformin @ home.  Look to add SGLT2i or GLP-1 agonist if not cost-prohibitive.  6.  Leukocytosis:  In setting of #2/steroids.  Low-grade fever this AM.  7.  Tob Abuse:  40 yr hx but has been aggressively cutting back recently and quit 3 wks ago.  Congratulated on quitting and strongly encouraged remaining off of cigarettes.  Signed, Murray Hodgkins, NP  03/25/2021, 12:50 PM    For questions or updates, please contact   Please consult [email or web address] for contact info under Cardiology/STEMI.   (As above) Agree with Hx, physcial exam x that now, after a breathing treatment no wheezing Still volume overloaded with JVP on exam, has received some lasix iv may need more Tachycardia COPD and wheezing preclude BB for now, hopefully in the am could add low dose bisoprolol   Await echo

## 2021-03-25 NOTE — Progress Notes (Addendum)
NAME:  Elaine Wright, MRN:  [id number], DOB:  25-Jun-1956, LOS: 1 ADMISSION DATE:  03/24/2021, INITIAL CONSULTATION DATE:  03/24/2021 REFERRING MD:  Lauree Chandler MD, CHIEF COMPLAINT:  Chest Pain  Brief Patient Description:  65 y.o female  presenting to [facility] ED on 4/9 with c/o shortness of breath and chest pain. Patient was noted with significant wheezing in all lung fields for which she received 2 doses of albuterol treatment, 125 of Solu-Medrol  and placed on BiPAP. Code STEMI was called on route for worsening chest pain, patient received 325 mg of aspirin and 2 of nitro on route. Stat EKG was obtained which showed anterolateral ST elevation consistent with a STEMI. Cardiology was consulted for code STEMI who recommended patient to be intubated due to not being able to lay on cath lab table on BiPAP. Patient taken emergently to Cath Lab for intervention and subsequently transferred to the ICU intubated.  PCCM consulted for further management  Pertinent  Medical History  Type 2 diabetes mellitus (CMS-HCC)  Osteoarthritis  Obstructive sleep apnea  COPD Other osteoporosis without current pathological fracture  Moderate tobacco use disorder, in early remission - 10 cpd x 45 yrs  GERD  Significant Hospital Events: Including procedures, antibiotic start and stop dates in addition to other pertinent events   03/24/21: Presenting to the ED with acute chest pain. EKG with anterior STEMI. Code STEMI called and patient taken to cath Lab 03/24/21: Admitted to ICU s/p 03/24/21: Extubated to BiPAP 03/25/21: Remains on BiPAP  Significant Diagnostic Tests:  4/9 Chest Xray>Right infrahilar atelectasis versus less likely developing infiltrate  Micro Data:  N/A  Antimicrobials:  N/A  Consults:  Cardiology  Procedures:  03/24/21: Intubation  Interim History / Subjective:  -Patient successfully liberated from the vent on 4/9 -Patient remains on BiPAP and tolerating  Objective   Blood pressure  101/72, pulse 69, temperature 98.4 F (36.9 C), temperature source Axillary, resp. rate 18, SpO2 97 %.    Vent Mode: PRVC FiO2 (%):  [40 %-100 %] 40 % Set Rate:  [16 bmp] 16 bmp Vt Set:  [450 mL] 450 mL PEEP:  [5 cmH20] 5 cmH20 Plateau Pressure:  [36 cmH20] 36 cmH20   Intake/Output Summary (Last 24 hours) at 03/25/2021 0923 Last data filed at 03/25/2021 0700 Gross per 24 hour  Intake 2364.39 ml  Output 1290 ml  Net 1074.39 ml   There were no vitals filed for this visit.  Physical Examination: GENERAL: 65 year old patient lying in the bed with no acute distress.  EYES: Pupils equal, round, reactive to light and accommodation. No scleral icterus. Extraocular muscles intact.  HEENT: Head atraumatic, normocephalic. Oropharynx and nasopharynx clear.  NECK:  Supple, no jugular venous distention. No thyroid enlargement, no tenderness.  LUNGS: Mild wheezing breath sounds bilaterally, with moderte rales and rhonchi. No crepitation. No use of accessory muscles of respiration.  CARDIOVASCULAR: S1, S2 normal. No murmurs, rubs, or gallops.  ABDOMEN: Soft, nontender, nondistended. Bowel sounds present. No organomegaly or mass.  EXTREMITIES: No pedal edema, cyanosis, or clubbing.  NEUROLOGIC: Cranial nerves II through XII are intact. Except speech is mildly garbled but comprehensible. Muscle strength 5/5 in all extremities. Sensation intact. Gait not checked.  PSYCHIATRIC: The patient is alert and oriented x 3.  SKIN: No obvious rash, lesion, or ulcer.     Resolved Hospital Problem list   None  Assessment & Plan:   Acute Hypoxic / Hypercapnic Respiratory Failure in the setting of ACOPD exacerbation  PMHx: COPD,  tobacco use, Obstructive Sleep Apnea - Continue BIPAP, wean FiO2 as tolerated - Supplemental O2 to maintain SpO2 > 90% - Intermittent chest x-ray & ABG PRN - Daily WUA with SBT as tolerated  - Ensure adequate pulmonary hygiene  - F/u cultures, trend PCT - nebs BID, bronchodilators  PRN - Smoking cessation education once stabilized   STEMI likely in the setting of ACOPD exacerbation leading to systemic inflammatory response and oxidative stress on the myocardium. EKG 12 lead performed: Anterolateral ST elevation S/p cardiac cath with stent to proximal LAD 4/9  - TTEcho to assess LV Function  - Trend troponins  - Continue anti platelet: Ticagrelor - Start high dose statin Atorvastatin 80 mg daily - ASA 81mg  PO daily - Continuous cardiac monitoring - consider NM stress test once stabilized - f/u A1c, TSH & thyroid panel, lipid panel - Cardiology following, appreciate input    Acute Metabolic Encephalopathy due to above no improved with correction of hypoxia - Provide supportive care    Diabetes mellitus - CBGs - Sliding scale insulin - Follow ICU hyper/hypoglycemia protocol - Hold home Metformin    Best practice (right click and "Reselect all SmartList Selections" daily)  Diet:  NPO Pain/Anxiety/Delirium protocol (if indicated): Yes (RASS goal 0) VAP protocol (if indicated): Yes DVT prophylaxis: LMWH GI prophylaxis: PPI Glucose control:  SSI No Central venous access:  N/A Arterial line:  N/A Foley:  Yes, and it is still needed Mobility:  bed rest  PT consulted: N/A Last date of multidisciplinary goals of care discussion [03/23/2021] Code Status:  full code Disposition: ICU  Labs/imaging that I havepersonally reviewed  (right click and "Reselect all SmartList Selections" daily)    Labs   CBC: Recent Labs  Lab 03/21/21 1554 03/24/21 0643 03/25/21 0400  WBC 12.1* 26.2* 20.6*  NEUTROABS  --  19.5*  --   HGB 13.8 14.6 11.8*  HCT 42.4 45.0 36.8  MCV 86.4 86.7 87.4  PLT 284 378 774    Basic Metabolic Panel: Recent Labs  Lab 03/21/21 1554 03/24/21 0643 03/25/21 0400  NA 138 137 142  K 4.2 3.9 4.3  CL 105 100 107  CO2 24 28 24   GLUCOSE 111* 257* 158*  BUN 17 23 19   CREATININE 0.65 0.65 0.55  CALCIUM 9.2 8.6* 8.3*  MG  --   --   2.0  PHOS  --   --  2.4*   GFR: Estimated Creatinine Clearance: 59.8 mL/min (by C-G formula based on SCr of 0.55 mg/dL). Recent Labs  Lab 03/21/21 1554 03/24/21 0643 03/25/21 0400  WBC 12.1* 26.2* 20.6*    Liver Function Tests: Recent Labs  Lab 03/21/21 1554 03/24/21 0643 03/25/21 0400  AST 23 29 39  ALT 20 25 24   ALKPHOS 68 73 52  BILITOT 0.7 0.6 0.5  PROT 7.2 8.1 6.1*  ALBUMIN 4.5 4.5 3.3*   No results for input(s): LIPASE, AMYLASE in the last 168 hours. No results for input(s): AMMONIA in the last 168 hours.  ABG    Component Value Date/Time   PHART 7.25 (L) 03/24/2021 0853   PCO2ART 59 (H) 03/24/2021 0853   PO2ART 182 (H) 03/24/2021 0853   HCO3 25.9 03/24/2021 0853   ACIDBASEDEF 2.4 (H) 03/24/2021 0853   O2SAT 99.5 03/24/2021 0853     Coagulation Profile: Recent Labs  Lab 03/24/21 0643  INR 1.0    Cardiac Enzymes: No results for input(s): CKTOTAL, CKMB, CKMBINDEX, TROPONINI in the last 168 hours.  HbA1C: Hgb A1c MFr  Bld  Date/Time Value Ref Range Status  03/24/2021 07:12 PM 6.9 (H) 4.8 - 5.6 % Final    Comment:    (NOTE) Pre diabetes:          5.7%-6.4%  Diabetes:              >6.4%  Glycemic control for   <7.0% adults with diabetes     CBG: Recent Labs  Lab 03/24/21 1944 03/24/21 2356 03/25/21 0342 03/25/21 0747  GLUCAP 200* 176* 146* 179*    Allergies Allergies  Allergen Reactions  . Morphine And Related Other (See Comments)    Confusion, hallucinations     Home Medications  Prior to Admission medications   Medication Sig Start Date End Date Taking? Authorizing Provider  albuterol (VENTOLIN HFA) 108 (90 Base) MCG/ACT inhaler Inhale 2 puffs into the lungs every 6 (six) hours as needed for wheezing or shortness of breath. 03/21/21  Yes Blake Divine, MD  ipratropium-albuterol (DUONEB) 0.5-2.5 (3) MG/3ML SOLN USE 1 VIAL VIA NEBULIZER FOUR TIMES DAILY AS DIRECTED 03/22/21  Yes [provider]  metFORMIN (GLUCOPHAGE-XR)  500 MG 24 hr tablet Take 1,000 mg by mouth daily. afternoon 03/04/20  Yes [provider]  metroNIDAZOLE (METROCREAM) 0.75 % cream Apply topically at bedtime. qhs to face for Rosacea 02/05/21 02/05/22 Yes Brendolyn Patty, MD  predniSONE (DELTASONE) 20 MG tablet Take 3 tablets (60 mg total) by mouth daily with breakfast for 5 days. 03/21/21 03/26/21 Yes Blake Divine, MD  omeprazole (PRILOSEC) 20 MG capsule Take 1 capsule by mouth daily. Patient not taking: Reported on 03/24/2021 03/21/21   [provider]    Scheduled Meds: . aspirin  81 mg Per Tube Daily  . atorvastatin  80 mg Oral Daily  . Chlorhexidine Gluconate Cloth  6 each Topical Daily  . dextromethorphan-guaiFENesin  1 tablet Oral BID  . enoxaparin (LOVENOX) injection  40 mg Subcutaneous Q24H  . furosemide  20 mg Intravenous Once  . insulin aspart  0-9 Units Subcutaneous Q4H  . ipratropium-albuterol  3 mL Nebulization Q4H  . methylPREDNISolone (SOLU-MEDROL) injection  40 mg Intravenous Q12H  . phosphorus  250 mg Oral Q4H  . sodium chloride flush  3 mL Intravenous Q12H  . ticagrelor  90 mg Per Tube BID   Continuous Infusions: . sodium chloride    . sodium chloride 75 mL/hr at 03/25/21 0700   PRN Meds:.sodium chloride, acetaminophen, docusate sodium, ipratropium-albuterol, ondansetron (ZOFRAN) IV, polyethylene glycol, sodium chloride flush   Critical care time: [place], DNP,CCRN, FNP-C, AGACNP-BC Acute Care Nurse Practitioner  [facility] Pager: [phone] [place] [facility]

## 2021-03-25 NOTE — Progress Notes (Signed)
*  PRELIMINARY RESULTS* Echocardiogram 2D Echocardiogram has been performed.  Elaine Wright 03/25/2021, 11:52 AM

## 2021-03-25 NOTE — Plan of Care (Signed)
A&O patient from home, admitted with STEMI to ICU bed 16.

## 2021-03-25 NOTE — Consult Note (Signed)
PHARMACY CONSULT NOTE - FOLLOW UP  Pharmacy Consult for Electrolyte Monitoring and Replacement   Recent Labs: Potassium (mmol/L)  Date Value  03/25/2021 4.3   Magnesium (mg/dL)  Date Value  03/25/2021 2.0   Calcium (mg/dL)  Date Value  03/25/2021 8.3 (L)   Albumin (g/dL)  Date Value  03/25/2021 3.3 (L)   Phosphorus (mg/dL)  Date Value  03/25/2021 2.4 (L)   Sodium (mmol/L)  Date Value  03/25/2021 142     Assessment: 65 y.o female with PMH as below presenting to [facility] ED today with chief complaints of shortness of breath and chest pain. S/p Cath 4/9 -Successful PTCA/DES x 1 proximal LAD.     Goal of Therapy:  WNL  Plan:  Will give KPhos 1 tabs x 2.  F/u with AM labs.   Oswald Hillock ,PharmD Clinical Pharmacist 03/25/2021 8:37 AM

## 2021-03-26 ENCOUNTER — Encounter: Payer: Self-pay | Admitting: Cardiovascular Disease

## 2021-03-26 ENCOUNTER — Other Ambulatory Visit: Payer: Self-pay

## 2021-03-26 DIAGNOSIS — I213 ST elevation (STEMI) myocardial infarction of unspecified site: Secondary | ICD-10-CM

## 2021-03-26 DIAGNOSIS — I2109 ST elevation (STEMI) myocardial infarction involving other coronary artery of anterior wall: Principal | ICD-10-CM

## 2021-03-26 LAB — CBC
HCT: 37.2 % (ref 36.0–46.0)
Hemoglobin: 12.1 g/dL (ref 12.0–15.0)
MCH: 28.1 pg (ref 26.0–34.0)
MCHC: 32.5 g/dL (ref 30.0–36.0)
MCV: 86.3 fL (ref 80.0–100.0)
Platelets: 277 10*3/uL (ref 150–400)
RBC: 4.31 MIL/uL (ref 3.87–5.11)
RDW: 14 % (ref 11.5–15.5)
WBC: 15.3 10*3/uL — ABNORMAL HIGH (ref 4.0–10.5)
nRBC: 0 % (ref 0.0–0.2)

## 2021-03-26 LAB — GLUCOSE, CAPILLARY
Glucose-Capillary: 242 mg/dL — ABNORMAL HIGH (ref 70–99)
Glucose-Capillary: 189 mg/dL — ABNORMAL HIGH (ref 70–99)
Glucose-Capillary: 174 mg/dL — ABNORMAL HIGH (ref 70–99)
Glucose-Capillary: 270 mg/dL — ABNORMAL HIGH (ref 70–99)
Glucose-Capillary: 225 mg/dL — ABNORMAL HIGH (ref 70–99)
Glucose-Capillary: 288 mg/dL — ABNORMAL HIGH (ref 70–99)
Glucose-Capillary: 233 mg/dL — ABNORMAL HIGH (ref 70–99)

## 2021-03-26 LAB — BASIC METABOLIC PANEL
Anion gap: 8 (ref 5–15)
BUN: 18 mg/dL (ref 8–23)
CO2: 29 mmol/L (ref 22–32)
Calcium: 8.1 mg/dL — ABNORMAL LOW (ref 8.9–10.3)
Chloride: 103 mmol/L (ref 98–111)
Creatinine, Ser: 0.6 mg/dL (ref 0.44–1.00)
GFR, Estimated: >60 mL/min (ref >60)
Glucose, Bld: 202 mg/dL — ABNORMAL HIGH (ref 70–99)
Potassium: 4.3 mmol/L (ref 3.5–5.1)
Sodium: 140 mmol/L (ref 135–145)

## 2021-03-26 LAB — LIPID PANEL
Cholesterol: 142 mg/dL (ref 0–200)
HDL: 62 mg/dL (ref >40)
LDL Cholesterol: 68 mg/dL (ref 0–99)
Total CHOL/HDL Ratio: 2.3 RATIO
Triglycerides: 61 mg/dL (ref <150)
VLDL: 12 mg/dL (ref 0–40)

## 2021-03-26 LAB — PHOSPHORUS: Phosphorus: 3.7 mg/dL (ref 2.5–4.6)

## 2021-03-26 LAB — MAGNESIUM: Magnesium: 2.1 mg/dL (ref 1.7–2.4)

## 2021-03-26 MED ORDER — ARFORMOTEROL TARTRATE 15 MCG/2ML IN NEBU
15.0000 ug | INHALATION_SOLUTION | Freq: Two times a day (BID) | RESPIRATORY_TRACT | Status: DC
  Administered 2021-03-26: 15 ug via RESPIRATORY_TRACT
  Filled 2021-03-26 (×4): qty 2

## 2021-03-26 MED ORDER — INSULIN ASPART 100 UNIT/ML ~~LOC~~ SOLN
0.0000 [IU] | Freq: Three times a day (TID) | SUBCUTANEOUS | Status: DC
  Administered 2021-03-26: 5 [IU] via SUBCUTANEOUS
  Administered 2021-03-26: 8 [IU] via SUBCUTANEOUS
  Administered 2021-03-26: 5 [IU] via SUBCUTANEOUS
  Administered 2021-03-26 – 2021-03-27 (×2): 3 [IU] via SUBCUTANEOUS
  Filled 2021-03-26 (×5): qty 1

## 2021-03-26 MED ORDER — PNEUMOCOCCAL VAC POLYVALENT 25 MCG/0.5ML IJ INJ
0.5000 mL | INJECTION | INTRAMUSCULAR | Status: AC
  Administered 2021-03-27: 0.5 mL via INTRAMUSCULAR
  Filled 2021-03-26: qty 0.5

## 2021-03-26 MED ORDER — PREDNISONE 20 MG PO TABS
40.0000 mg | ORAL_TABLET | Freq: Every day | ORAL | Status: DC
  Administered 2021-03-27: 40 mg via ORAL
  Filled 2021-03-26: qty 2

## 2021-03-26 MED ORDER — IPRATROPIUM-ALBUTEROL 0.5-2.5 (3) MG/3ML IN SOLN
3.0000 mL | Freq: Four times a day (QID) | RESPIRATORY_TRACT | Status: DC
  Administered 2021-03-26 – 2021-03-27 (×4): 3 mL via RESPIRATORY_TRACT
  Filled 2021-03-26 (×4): qty 3

## 2021-03-26 MED ORDER — METOPROLOL SUCCINATE ER 25 MG PO TB24
25.0000 mg | ORAL_TABLET | Freq: Every day | ORAL | Status: DC
  Administered 2021-03-26 – 2021-03-27 (×2): 25 mg via ORAL
  Filled 2021-03-26 (×2): qty 1

## 2021-03-26 MED ORDER — FUROSEMIDE 10 MG/ML IJ SOLN
20.0000 mg | Freq: Once | INTRAMUSCULAR | Status: AC
  Administered 2021-03-26: 20 mg via INTRAVENOUS
  Filled 2021-03-26: qty 2

## 2021-03-26 MED ORDER — BUDESONIDE 0.25 MG/2ML IN SUSP
0.2500 mg | Freq: Two times a day (BID) | RESPIRATORY_TRACT | Status: DC
  Administered 2021-03-26 – 2021-03-27 (×2): 0.25 mg via RESPIRATORY_TRACT
  Filled 2021-03-26 (×2): qty 2

## 2021-03-26 NOTE — Progress Notes (Signed)
PROGRESS NOTE    Elaine Wright  TDH:[id number] DOB: 1956/09/30 DOA: 03/24/2021 PCP: [name], MD   Brief Narrative:  65 y.o female  presenting to [facility] ED on 4/9 with c/o shortness of breath and chest pain. Patient was noted with significant wheezing in all lung fields for which she received 2 doses of albuterol treatment, 125 of Solu-Medrol  and placed on BiPAP. Code STEMI was called on route for worsening chest pain, patient received 325 mg of aspirin and 2 of nitro on route. Stat EKG was obtained which showed anterolateral ST elevation consistent with a STEMI. Cardiology was consulted for code STEMI who recommended patient to be intubated due to not being able to lay on cath lab table on BiPAP. Patient taken emergently to Cath Lab for intervention and subsequently transferred to the ICU intubated.  Patient was intubated for airway protection.  She successfully extubated to BiPAP and as of 4/11 is on nasal cannula 2 L saturating very well.  Mentating clearly with no chest pain.  Seen by cardiology.  Will transfer to telemetry unit.   Assessment & Plan:   Active Problems:   STEMI (ST elevation myocardial infarction) (HCC)   Acute ST elevation myocardial infarction (STEMI) of anterior wall (HCC)  Acute STEMI Status post cardiac cath, PCI proximal LAD 4/9 Cardiology following Plan: Dual antiplatelet therapy aspirin and ticagrelor High-dose statin Metoprolol added per cardiology ACE inhibitor Appreciate cardiology follow-up  Acute exacerbation of COPD Acute hypoxic/hypercarbic respiratory failure secondary to above Required intubation for airway protection Weaned to BiPAP and subsequently to nasal cannula Stable on nasal cannula with improvement in respiratory status Plan: Transfer to PCU Continue as needed BiPAP Continue supplemental oxygen, wean as tolerated Nebulizer regimen P.o. prednisone Unsure pulmonary hygiene was quick  Acute metabolic encephalopathy Related  to hypoxia, now resolved  Diabetes mellitus Insulin sliding scale CBGs before meals and at bedtime Hold oral agents  Tobacco abuse 40-pack-year history Quit 3 to 4 weeks ago   DVT prophylaxis: SQ Lovenox Code Status: Full Family Communication: None today Disposition Plan:Status is: Inpatient  Remains inpatient appropriate because:Inpatient level of care appropriate due to severity of illness   Dispo: The patient is from: Home              Anticipated d/c is to: Home              Patient currently is not medically stable to d/c.   Difficult to place patient No   Approaching baseline from a respiratory standpoint.  Attempting to wean off supplemental oxygen.  Anticipated discharge home 4/12    Level of care: Progressive Cardiac  Consultants:   Cardiology   Procedures: Cardiac cath 4/9, PCI LAD  Antimicrobials:   None   Subjective: Patient seen and examined.  No specific complaints.  Improved breathing.  Denies chest pain or shortness of breath  Objective: Vitals:   03/26/21 1000 03/26/21 1100 03/26/21 1300 03/26/21 1354  BP: (!) 129/95 (!) 118/106 (!) 144/99 (!) 123/91  Pulse: 84 86 87 95  Resp: (!) 24 (!) 22 18 16   Temp:    98.1 F (36.7 C)  TempSrc:    Oral  SpO2: 95% 94% 94% 95%    Intake/Output Summary (Last 24 hours) at 03/26/2021 1610 Last data filed at 03/26/2021 1340 Gross per 24 hour  Intake 1323 ml  Output 1500 ml  Net -177 ml   There were no vitals filed for this visit.  Examination:  General exam: Appears calm  and comfortable  Respiratory system: Lung sounds diminished.  Mild bilateral crackles.  Normal work of breathing.  2 L Cardiovascular system: S1 & S2 heard, RRR. No JVD, murmurs, rubs, gallops or clicks. No pedal edema. Gastrointestinal system: Abdomen is nondistended, soft and nontender. No organomegaly or masses felt. Normal bowel sounds heard. Central nervous system: Alert and oriented. No focal neurological  deficits. Extremities: Symmetric 5 x 5 power. Skin: No rashes, lesions or ulcers Psychiatry: Judgement and insight appear normal. Mood & affect appropriate.     Data Reviewed: I have personally reviewed following labs and imaging studies  CBC: Recent Labs  Lab 03/21/21 1554 03/24/21 0643 03/25/21 0400 03/26/21 0455  WBC 12.1* 26.2* 20.6* 15.3*  NEUTROABS  --  19.5*  --   --   HGB 13.8 14.6 11.8* 12.1  HCT 42.4 45.0 36.8 37.2  MCV 86.4 86.7 87.4 86.3  PLT 284 378 275 034   Basic Metabolic Panel: Recent Labs  Lab 03/21/21 1554 03/24/21 0643 03/25/21 0400 03/26/21 0455  NA 138 137 142 140  K 4.2 3.9 4.3 4.3  CL 105 100 107 103  CO2 24 28 24 29   GLUCOSE 111* 257* 158* 202*  BUN 17 23 19 18   CREATININE 0.65 0.65 0.55 0.60  CALCIUM 9.2 8.6* 8.3* 8.1*  MG  --   --  2.0 2.1  PHOS  --   --  2.4* 3.7   GFR: Estimated Creatinine Clearance: 59.8 mL/min (by C-G formula based on SCr of 0.6 mg/dL). Liver Function Tests: Recent Labs  Lab 03/21/21 1554 03/24/21 0643 03/25/21 0400  AST 23 29 39  ALT 20 25 24   ALKPHOS 68 73 52  BILITOT 0.7 0.6 0.5  PROT 7.2 8.1 6.1*  ALBUMIN 4.5 4.5 3.3*   No results for input(s): LIPASE, AMYLASE in the last 168 hours. No results for input(s): AMMONIA in the last 168 hours. Coagulation Profile: Recent Labs  Lab 03/24/21 0643  INR 1.0   Cardiac Enzymes: No results for input(s): CKTOTAL, CKMB, CKMBINDEX, TROPONINI in the last 168 hours. BNP (last 3 results) No results for input(s): PROBNP in the last 8760 hours. HbA1C: Recent Labs    03/24/21 1912  HGBA1C 6.9*   CBG: Recent Labs  Lab 03/25/21 2350 03/26/21 0350 03/26/21 0735 03/26/21 1127 03/26/21 1233  GLUCAP 166* 189* 174* 270* 225*   Lipid Profile: Recent Labs    03/26/21 0455  CHOL 142  HDL 62  LDLCALC 68  TRIG 61  CHOLHDL 2.3   Thyroid Function Tests: No results for input(s): TSH, T4TOTAL, FREET4, T3FREE, THYROIDAB in the last 72 hours. Anemia Panel: No  results for input(s): VITAMINB12, FOLATE, FERRITIN, TIBC, IRON, RETICCTPCT in the last 72 hours. Sepsis Labs: No results for input(s): PROCALCITON, LATICACIDVEN in the last 168 hours.  Recent Results (from the past 240 hour(s))  Resp Panel by RT-PCR (Flu A&B, Covid) Nasopharyngeal Swab     Status: None   Collection Time: 03/24/21  6:43 AM   Specimen: Nasopharyngeal Swab; Nasopharyngeal(NP) swabs in vial transport medium  Result Value Ref Range Status   SARS Coronavirus 2 by RT PCR NEGATIVE NEGATIVE Final    Comment: (NOTE) SARS-CoV-2 target nucleic acids are NOT DETECTED.  The SARS-CoV-2 RNA is generally detectable in upper respiratory specimens during the acute phase of infection. The lowest concentration of SARS-CoV-2 viral copies this assay can detect is 138 copies/mL. A negative result does not preclude SARS-Cov-2 infection and should not be used as the sole basis for  treatment or other patient management decisions. A negative result may occur with  improper specimen collection/handling, submission of specimen other than nasopharyngeal swab, presence of viral mutation(s) within the areas targeted by this assay, and inadequate number of viral copies(<138 copies/mL). A negative result must be combined with clinical observations, patient history, and epidemiological information. The expected result is Negative.  Fact Sheet for Patients:  EntrepreneurPulse.com.au  Fact Sheet for Healthcare Providers:  IncredibleEmployment.be  This test is no t yet approved or cleared by the [place] FDA and  has been authorized for detection and/or diagnosis of SARS-CoV-2 by FDA under an Emergency Use Authorization (EUA). This EUA will remain  in effect (meaning this test can be used) for the duration of the COVID-19 declaration under Section 564(b)(1) of the Act, 21 U.S.C.section 360bbb-3(b)(1), unless the authorization is terminated  or revoked sooner.        Influenza A by PCR NEGATIVE NEGATIVE Final   Influenza B by PCR NEGATIVE NEGATIVE Final    Comment: (NOTE) The Xpert Xpress SARS-CoV-2/FLU/RSV plus assay is intended as an aid in the diagnosis of influenza from Nasopharyngeal swab specimens and should not be used as a sole basis for treatment. Nasal washings and aspirates are unacceptable for Xpert Xpress SARS-CoV-2/FLU/RSV testing.  Fact Sheet for Patients: EntrepreneurPulse.com.au  Fact Sheet for Healthcare Providers: IncredibleEmployment.be  This test is not yet approved or cleared by the [place] FDA and has been authorized for detection and/or diagnosis of SARS-CoV-2 by FDA under an Emergency Use Authorization (EUA). This EUA will remain in effect (meaning this test can be used) for the duration of the COVID-19 declaration under Section 564(b)(1) of the Act, 21 U.S.C. section 360bbb-3(b)(1), unless the authorization is terminated or revoked.  Performed at [facility], [address] [place], [place] [address]   MRSA PCR Screening     Status: None   Collection Time: 03/24/21  9:41 AM   Specimen: Nasal Mucosa; Nasopharyngeal  Result Value Ref Range Status   MRSA by PCR NEGATIVE NEGATIVE Final    Comment:        The GeneXpert MRSA Assay (FDA approved for NASAL specimens only), is one component of a comprehensive MRSA colonization surveillance program. It is not intended to diagnose MRSA infection nor to guide or monitor treatment for MRSA infections. Performed at [facility], [address] [place], [place] [address]          Radiology Studies: ECHOCARDIOGRAM COMPLETE  Result Date: 03/25/2021    ECHOCARDIOGRAM REPORT   Patient Name:   Elaine Wright Date of Exam: 03/25/2021 Medical Rec #:  [id number]       Height:       60.0 in Accession #:    [id number]      Weight:       147.0 lb Date of Birth:  08/30/1956      BSA:          1.638 m  Patient Age:    68 years        BP:           96/74 mmHg Patient Gender: F               HR:           90 bpm. Exam Location:  [facility] Procedure: 2D Echo and Strain Analysis Indications:     CAD Native Vessel I25.10  History:         Patient has no prior history of  Echocardiogram examinations.  Sonographer:     Arville Go RDCS Referring Phys:  [place] Diagnosing Phys: Buford Dresser MD  Sonographer Comments: Technically difficult study due to poor echo windows. IMPRESSIONS  1. Left ventricular ejection fraction, by estimation, is 60 to 65%. The left ventricle has normal function. The left ventricle has no regional wall motion abnormalities. There is mild concentric left ventricular hypertrophy. Left ventricular diastolic parameters were normal.  2. Right ventricular systolic function is normal. The right ventricular size is normal.  3. The mitral valve is normal in structure. Trivial mitral valve regurgitation. No evidence of mitral stenosis.  4. The aortic valve is tricuspid. Aortic valve regurgitation is not visualized. No aortic stenosis is present. Comparison(s): No prior Echocardiogram. Conclusion(s)/Recommendation(s): Technically limited images. Grossly normal LVEF, no focal wall motion abnormalities appreciated. FINDINGS  Left Ventricle: Left ventricular ejection fraction, by estimation, is 60 to 65%. The left ventricle has normal function. The left ventricle has no regional wall motion abnormalities. Global longitudinal strain performed but not reported based on interpreter judgement due to suboptimal tracking. The left ventricular internal cavity size was normal in size. There is mild concentric left ventricular hypertrophy. Left ventricular diastolic parameters were normal. Right Ventricle: The right ventricular size is normal. Right vetricular wall thickness was not well visualized. Right ventricular systolic function is normal. Left Atrium: Left atrial size was normal in  size. Right Atrium: Right atrial size was normal in size. Pericardium: There is no evidence of pericardial effusion. Mitral Valve: The mitral valve is normal in structure. Trivial mitral valve regurgitation. No evidence of mitral valve stenosis. Tricuspid Valve: The tricuspid valve is normal in structure. Tricuspid valve regurgitation is trivial. No evidence of tricuspid stenosis. Aortic Valve: The aortic valve is tricuspid. Aortic valve regurgitation is not visualized. No aortic stenosis is present. Aortic valve peak gradient measures 9.2 mmHg. Pulmonic Valve: The pulmonic valve was grossly normal. Pulmonic valve regurgitation is not visualized. No evidence of pulmonic stenosis. Aorta: The aortic root and ascending aorta are structurally normal, with no evidence of dilitation. IAS/Shunts: The atrial septum is grossly normal.  LEFT VENTRICLE PLAX 2D LVIDd:         4.39 cm     Diastology LVIDs:         3.11 cm     LV e' medial:    7.62 cm/s LV PW:         1.24 cm     LV E/e' medial:  9.2 LV IVS:        1.10 cm     LV e' lateral:   6.53 cm/s LVOT diam:     1.90 cm     LV E/e' lateral: 10.8 LV SV:         67 LV SV Index:   41 LVOT Area:     2.84 cm  LV Volumes (MOD) LV vol d, MOD A4C: 58.1 ml LV vol s, MOD A4C: 25.7 ml LV SV MOD A4C:     58.1 ml LEFT ATRIUM             Index LA diam:        3.50 cm 2.14 cm/m LA Vol (A2C):   39.7 ml 24.24 ml/m LA Vol (A4C):   32.9 ml 20.09 ml/m LA Biplane Vol: 37.1 ml 22.65 ml/m  AORTIC VALVE                PULMONIC VALVE AV Area (Vmax): 2.09 cm    PV  Vmax:       1.15 m/s AV Vmax:        152.00 cm/s PV Peak grad:  5.3 mmHg AV Peak Grad:   9.2 mmHg LVOT Vmax:      112.00 cm/s LVOT Vmean:     80.800 cm/s LVOT VTI:       0.237 m  AORTA Ao Root diam: 2.90 cm Ao Asc diam:  3.00 cm MITRAL VALVE MV Area (PHT): 5.34 cm     SHUNTS MV Decel Time: 142 msec     Systemic VTI:  0.24 m MV E velocity: 70.30 cm/s   Systemic Diam: 1.90 cm MV A velocity: 119.00 cm/s MV E/A ratio:  0.59 Buford Dresser MD Electronically signed by Buford Dresser MD Signature Date/Time: 03/25/2021/5:30:23 PM    Final         Scheduled Meds: . arformoterol  15 mcg Nebulization BID  . aspirin  81 mg Per Tube Daily  . atorvastatin  80 mg Oral Daily  . budesonide (PULMICORT) nebulizer solution  0.25 mg Nebulization BID  . Chlorhexidine Gluconate Cloth  6 each Topical Daily  . dextromethorphan-guaiFENesin  1 tablet Oral BID  . enoxaparin (LOVENOX) injection  40 mg Subcutaneous Q24H  . insulin aspart  0-15 Units Subcutaneous TID AC & HS  . ipratropium-albuterol  3 mL Nebulization Q6H  . metoprolol succinate  25 mg Oral Daily  . [START ON 03/27/2021] predniSONE  40 mg Oral Q breakfast  . sodium chloride flush  3 mL Intravenous Q12H  . ticagrelor  90 mg Per Tube BID   Continuous Infusions: . sodium chloride       LOS: 2 days    Time spent: 25 minutes    Sidney Ace, MD [facility] Pager [phone]  If 7PM-7AM, please contact night-coverage 03/26/2021, 4:10 PM

## 2021-03-26 NOTE — Progress Notes (Signed)
VSS. In no acute distress. Denies pain. Symmetrical in all extremities, A&OX4.

## 2021-03-26 NOTE — Progress Notes (Signed)
Progress Note  Patient Name: Elaine Wright Date of Encounter: 03/26/2021  Primary Cardiologist: New - consult by [facility]  Subjective   No chest pain or SOB.  Feels like her breathing is back to baseline though remains on supplemental oxygen via nasal cannula at 3 L.  Echo showed an EF of 60 to 65%, no regional wall motion abnormalities, mild concentric LVH, normal LV diastolic function parameters, normal RV systolic function and ventricular cavity size, and trivial mitral regurgitation.  Inpatient Medications    Scheduled Meds: . arformoterol  15 mcg Nebulization BID  . aspirin  81 mg Per Tube Daily  . atorvastatin  80 mg Oral Daily  . budesonide (PULMICORT) nebulizer solution  0.25 mg Nebulization BID  . Chlorhexidine Gluconate Cloth  6 each Topical Daily  . dextromethorphan-guaiFENesin  1 tablet Oral BID  . enoxaparin (LOVENOX) injection  40 mg Subcutaneous Q24H  . furosemide  20 mg Intravenous Once  . insulin aspart  0-15 Units Subcutaneous TID AC & HS  . ipratropium-albuterol  3 mL Nebulization Q6H  . metoprolol succinate  25 mg Oral Daily  . [START ON 03/27/2021] predniSONE  40 mg Oral Q breakfast  . sodium chloride flush  3 mL Intravenous Q12H  . ticagrelor  90 mg Per Tube BID   Continuous Infusions: . sodium chloride     PRN Meds: sodium chloride, acetaminophen, chlorpheniramine-HYDROcodone, docusate sodium, ipratropium-albuterol, ondansetron (ZOFRAN) IV, polyethylene glycol, sodium chloride flush   Vital Signs    Vitals:   03/26/21 0700 03/26/21 0800 03/26/21 0845 03/26/21 0900  BP: 133/79 137/90  132/84  Pulse: 63 86  63  Resp: 13 (!) 23  14  Temp:   97.8 F (36.6 C)   TempSrc:   Oral   SpO2: 98% 91%  90%    Intake/Output Summary (Last 24 hours) at 03/26/2021 1051 Last data filed at 03/26/2021 0950 Gross per 24 hour  Intake 663 ml  Output 2400 ml  Net -1737 ml   There were no vitals filed for this visit.  Physical Exam   GEN: Well nourished, well  developed, in no acute distress.  HEENT: Grossly normal.  Neck: Supple, mildly elevated JVD, no carotid bruits, or masses. Cardiac: RRR, 2/6 SEM loudest @ upper sternal borders.  No murmurs, rubs, or gallops. No clubbing, cyanosis, edema.  Radials 2+, DP/PT 2+ and equal bilaterally. Right radial cardiac cath site without bleeding, bruit, hematoma, swelling, warmth, or erythema.  Radial pulse 2+. Respiratory: Mildly diminished breath sounds along the bilateral bases. GI: Soft, nontender, nondistended, BS + x 4. MS: no deformity or atrophy. Skin: warm and dry, no rash. Neuro:  Strength and sensation are intact. Psych: AAOx3.  Normal affect.  Labs    Chemistry Recent Labs  Lab 03/21/21 1554 03/24/21 0643 03/25/21 0400 03/26/21 0455  NA 138 137 142 140  K 4.2 3.9 4.3 4.3  CL 105 100 107 103  CO2 24 28 24 29   GLUCOSE 111* 257* 158* 202*  BUN 17 23 19 18   CREATININE 0.65 0.65 0.55 0.60  CALCIUM 9.2 8.6* 8.3* 8.1*  PROT 7.2 8.1 6.1*  --   ALBUMIN 4.5 4.5 3.3*  --   AST 23 29 39  --   ALT 20 25 24   --   ALKPHOS 68 73 52  --   BILITOT 0.7 0.6 0.5  --   GFRNONAA >60 >60 >60 >60  ANIONGAP 9 9 11 8      Hematology Recent Labs  Lab 03/24/21 0643 03/25/21 0400 03/26/21 0455  WBC 26.2* 20.6* 15.3*  RBC 5.19* 4.21 4.31  HGB 14.6 11.8* 12.1  HCT 45.0 36.8 37.2  MCV 86.7 87.4 86.3  MCH 28.1 28.0 28.1  MCHC 32.4 32.1 32.5  RDW 13.6 13.9 14.0  PLT 378 275 277    Cardiac Enzymes  Recent Labs  Lab 03/21/21 1554 03/24/21 0643 03/24/21 0924  TROPONINIHS 7 60* 1,126*      BNP Recent Labs  Lab 03/24/21 0643 03/25/21 0408  BNP 56.7 620.9*     HbA1c  Lab Results  Component Value Date   HGBA1C 6.9 (H) 03/24/2021    Radiology    DG Chest 2 View  Result Date: 03/21/2021 IMPRESSION: Right infrahilar atelectasis versus less likely developing infiltrate. Clinical correlation is recommended. Electronically Signed   By: Anner Crete M.D.   On: 03/21/2021 16:55   CT  Soft Tissue Neck W Contrast  Result Date: 03/21/2021 IMPRESSION: 1. No acute abnormality identified in the neck. 2. 6 mm stone in the left submandibular duct with submandibular gland atrophy. Electronically Signed   By: Logan Bores M.D.   On: 03/21/2021 19:20    DG Chest Port 1 View  Result Date: 03/24/2021 IMPRESSION: 1. Endotracheal tube tip in satisfactory position projecting approximately 5 cm above the carina. 2. No acute cardiopulmonary disease. Electronically Signed   By: Evangeline Dakin M.D.   On: 03/24/2021 09:44    Telemetry    SR - Personally Reviewed  ECG    No new tracings - Personally Reviewed  Cardiac Studies   Cardiac Catheterization and Percutaneous Coronary Intervention 4.9.2022  Left Anterior Descending  Vessel is large.  Ost LAD to Prox LAD lesion is 80% stenosed. The lesion is eccentric. Ultrasound (IVUS) was performed. Minimum lumen diameter: 3 mm. Severe plaque burden was detected. IVUS has determined that the lesion is calcified.      **The ostial/proximal LAD was successfully treated w/ a 3.0 x 12 mm Synergy DES**  Left Circumflex  Vessel is large.  Right Coronary Artery  Vessel is large.  _____________  2D Echocardiogram 03/25/2021: 1. Left ventricular ejection fraction, by estimation, is 60 to 65%. The  left ventricle has normal function. The left ventricle has no regional  wall motion abnormalities. There is mild concentric left ventricular  hypertrophy. Left ventricular diastolic  parameters were normal.  2. Right ventricular systolic function is normal. The right ventricular  size is normal.  3. The mitral valve is normal in structure. Trivial mitral valve  regurgitation. No evidence of mitral stenosis.  4. The aortic valve is tricuspid. Aortic valve regurgitation is not  visualized. No aortic stenosis is present.  _____________   Patient Profile     65 y.o. female w/ a h/o tob abuse, COPD, OA, DM, GERD, and OSA, who was admitted 4/9 w/  chest pain, resp failure req intubation, and acute anterolateral STEMI. Now s/p PCI/DES to the ostial/prox LAD on 4/9.  Extubated to BiPAP 4/9.  Assessment & Plan    1.  Acute anterolateral STEMI/CAD:  Presented 4/9 with progressive dyspnea and chest tightness over a 10 day period.  Found to have anterolateral ST elevation and respiratory failure requiring intubation.  Cardiac cath revealed an 80% ostial/proximal LAD stenosis and otherwise normal coronary arteries.  She is now s/p PCI/DES to the ost/prox LAD. Extubated 4/9 and weaned off of BiPAP to [place].  No chest pain or dyspnea presently, though does remain on supplemental oxygen via nasal  cannula.  Echo demonstrated preserved LV systolic function and normal wall motion.  Continue DAPT with ASA and Brilinta without interruption for least the next 12 months.  Add low-dose metoprolol.  Post-cath instructions.  Cardiac rehab.  Can likely transfer out of the ICU today with possible discharge home on 4/12.  2.  Acute resp failure/AECOPD:  Intubated on admission, extubated 4/9. Weaned off of BiPAP 4/10.  Currently on [place].  Steroids/nebs per pulm.  Feels like she is back to her baseline.  Wean supplemental oxygen as able per primary service.  3.  Acute HFpEF: In the setting of her MI.  JVD on exam.  She received Lasix IV 4/10, will repeat.  Good UO.  Echo with preserved LV SF as above.  4.  HL:  Cont high potency statin rx. Notes that she tried lipitor prev but stopped due to upset stomach.  Will cont for now but can change to alternate if necessary.  LDL 68 this admission.  5.  DMII:  A1c 6.9.  On metformin @ home.  Look to add SGLT2i or GLP-1 agonist if not cost-prohibitive.  6.  Leukocytosis:  In setting of #2/steroids.  Afebrile.  7.  Tob Abuse:  40 yr hx but has been aggressively cutting back recently and quit 3-4 wks ago.    Signed, Christell Faith, PA-C  03/26/2021, 10:51 AM    For questions or updates, please contact   Please consult [email or web address]  for contact info under Cardiology/STEMI.

## 2021-03-26 NOTE — Progress Notes (Signed)
Report called to [place] on 2A. VSS. Will transport via wheelchair.

## 2021-03-26 NOTE — Consult Note (Signed)
[facility] for Electrolyte Monitoring and Replacement   Recent Labs: Potassium (mmol/L)  Date Value  03/26/2021 4.3   Magnesium (mg/dL)  Date Value  03/26/2021 2.1   Calcium (mg/dL)  Date Value  03/26/2021 8.1 (L)   Albumin (g/dL)  Date Value  03/25/2021 3.3 (L)   Phosphorus (mg/dL)  Date Value  03/26/2021 3.7   Sodium (mmol/L)  Date Value  03/26/2021 140   Corrected Ca: 8.66 mg/dL  Assessment: 65 y.o female with PMH as below presenting to [facility] ED with chief complaints of shortness of breath and chest pain. S/p Cath 4/9 -Successful PTCA/DES x 1 proximal LAD.     Goal of Therapy:  Potassium 4.0 - 5.1 mmol/L Magnesium 2.0 - 2.4 mg/dL All Other Electrolytes WNL  Plan:   No electrolyte replacement warranted for today   F/u with AM labs   Dallie Piles ,PharmD Clinical Pharmacist 03/26/2021 7:27 AM

## 2021-03-27 DIAGNOSIS — Z23 Encounter for immunization: Secondary | ICD-10-CM | POA: Diagnosis present

## 2021-03-27 DIAGNOSIS — I2109 ST elevation (STEMI) myocardial infarction involving other coronary artery of anterior wall: Secondary | ICD-10-CM | POA: Diagnosis not present

## 2021-03-27 LAB — BASIC METABOLIC PANEL
Anion gap: 10 (ref 5–15)
BUN: 19 mg/dL (ref 8–23)
CO2: 30 mmol/L (ref 22–32)
Calcium: 8.8 mg/dL — ABNORMAL LOW (ref 8.9–10.3)
Chloride: 101 mmol/L (ref 98–111)
Creatinine, Ser: 0.84 mg/dL (ref 0.44–1.00)
GFR, Estimated: >60 mL/min (ref >60)
Glucose, Bld: 185 mg/dL — ABNORMAL HIGH (ref 70–99)
Potassium: 4.9 mmol/L (ref 3.5–5.1)
Sodium: 141 mmol/L (ref 135–145)

## 2021-03-27 LAB — GLUCOSE, CAPILLARY
Glucose-Capillary: 167 mg/dL — ABNORMAL HIGH (ref 70–99)
Glucose-Capillary: 124 mg/dL — ABNORMAL HIGH (ref 70–99)

## 2021-03-27 LAB — MAGNESIUM: Magnesium: 2 mg/dL (ref 1.7–2.4)

## 2021-03-27 MED ORDER — FUROSEMIDE 20 MG PO TABS
20.0000 mg | ORAL_TABLET | Freq: Every day | ORAL | Status: DC
  Administered 2021-03-27: 20 mg via ORAL
  Filled 2021-03-27: qty 1

## 2021-03-27 MED ORDER — ATORVASTATIN CALCIUM 80 MG PO TABS: 80.0000 mg | ORAL_TABLET | Freq: Every day | ORAL | 0 refills | Status: DC

## 2021-03-27 MED ORDER — PREDNISONE 20 MG PO TABS: 40.0000 mg | ORAL_TABLET | Freq: Every day | ORAL | 0 refills | Status: AC

## 2021-03-27 MED ORDER — FUROSEMIDE 20 MG PO TABS: 20.0000 mg | ORAL_TABLET | Freq: Every day | ORAL | 0 refills | Status: DC

## 2021-03-27 MED ORDER — TICAGRELOR 90 MG PO TABS: 90.0000 mg | ORAL_TABLET | Freq: Two times a day (BID) | ORAL | 0 refills | Status: DC

## 2021-03-27 MED ORDER — ASPIRIN 81 MG PO CHEW: 81.0000 mg | CHEWABLE_TABLET | Freq: Every day | ORAL | Status: DC

## 2021-03-27 MED ORDER — METOPROLOL SUCCINATE ER 25 MG PO TB24: 25.0000 mg | ORAL_TABLET | Freq: Every day | ORAL | 0 refills | Status: DC

## 2021-03-27 MED ORDER — ASPIRIN 81 MG PO CHEW: 81.0000 mg | CHEWABLE_TABLET | Freq: Every day | ORAL | Status: AC

## 2021-03-27 NOTE — Progress Notes (Signed)
Discharge instructions explained to pt/ verbalized an understanding/ iv and tele removed/ brilinta coupon provided/will transport off unit when ride arrives

## 2021-03-27 NOTE — Discharge Instructions (Signed)
Chronic Obstructive Pulmonary Disease  Chronic obstructive pulmonary disease (COPD) is a long-term (chronic) condition that affects the lungs. COPD is a general term that can be used to describe many different lung problems that cause lung swelling (inflammation) and limit airflow, including chronic bronchitis and emphysema. If you have COPD, your lung function will probably never return to normal. In most cases, it gets worse over time. However, there are steps you can take to slow the progression of the disease and improve your quality of life. What are the causes? This condition may be caused by:  Smoking. This is the most common cause.  Certain genes passed down through families. What increases the risk? The following factors may make you more likely to develop this condition:  Secondhand smoke from cigarettes, pipes, or cigars.  Exposure to chemicals and other irritants such as fumes and dust in the work environment.  Chronic lung conditions or infections. What are the signs or symptoms? Symptoms of this condition include:  Shortness of breath, especially during physical activity.  Chronic cough with a large amount of thick mucus. Sometimes the cough may not have any mucus (dry cough).  Wheezing.  Rapid breaths.  Gray or bluish discoloration (cyanosis) of the skin, especially in your fingers, toes, or lips.  Feeling tired (fatigue).  Weight loss.  Chest tightness.  Frequent infections.  Episodes when breathing symptoms become much worse (exacerbations).  Swelling in the ankles, feet, or legs. This may occur in later stages of the disease. How is this diagnosed? This condition is diagnosed based on:  Your medical history.  A physical exam. You may also have tests, including:  Lung (pulmonary) function tests. This may include a spirometry test, which measures your ability to exhale properly.  Chest X-ray.  CT scan.  Blood tests. How is this treated? This  condition may be treated with:  Medicines. These may include inhaled rescue medicines to treat acute exacerbations as well as long-term, or maintenance, medicines to prevent flare-ups of COPD. ? Bronchodilators help treat COPD by dilating the airways to allow increased airflow and make your breathing more comfortable. ? Steroids can reduce airway inflammation and help prevent exacerbations.  Smoking cessation. If you smoke, your health care provider may ask you to quit, and may also recommend therapy or replacement products to help you quit.  Pulmonary rehabilitation. This may involve working with a team of health care providers and specialists, such as respiratory, occupational, and physical therapists.  Exercise and physical activity. These are beneficial for nearly all people with COPD.  Nutrition therapy to gain weight, if you are underweight.  Oxygen. Supplemental oxygen therapy is only helpful if you have a low oxygen level in your blood (hypoxemia).  Lung surgery or transplant.  Palliative care. This is to help people with COPD feel comfortable when treatment is no longer working. Follow these instructions at home: Medicines  Take over-the-counter and prescription medicines (inhaled or pills) only as told by your health care provider.  Talk to your health care provider before taking any cough or allergy medicines. You may need to avoid certain medicines that dry out your airways. Lifestyle  If you are a smoker, the most important thing that you can do is to stop smoking. Do not use any products that contain nicotine or tobacco, such as cigarettes and e-cigarettes. If you need help quitting, ask your health care provider. Continuing to smoke will cause the disease to progress faster.  Avoid exposure to things that irritate your   lungs, such as smoke, chemicals, and fumes.  Stay active, but balance activity with periods of rest. Exercise and physical activity will help you maintain  your ability to do things you want to do.  Learn and use relaxation techniques to manage stress and to control your breathing.  Get the right amount of sleep and get quality sleep. Most adults need 7 or more hours per night.  Eat healthy foods. Eating smaller, more frequent meals and resting before meals may help you maintain your strength. Controlled breathing Learn and use controlled breathing techniques as directed by your health care provider. Controlled breathing techniques include:  Pursed lip breathing. Start by breathing in (inhaling) through your nose for 1 second. Then, purse your lips as if you were going to whistle and breathe out (exhale) through the pursed lips for 2 seconds.  Diaphragmatic breathing. Start by putting one hand on your abdomen just above your waist. Inhale slowly through your nose. The hand on your abdomen should move out. Then purse your lips and exhale slowly. You should be able to feel the hand on your abdomen moving in as you exhale. Controlled coughing Learn and use controlled coughing to clear mucus from your lungs. Controlled coughing is a series of short, progressive coughs. The steps of controlled coughing are: 1. Lean your head slightly forward. 2. Breathe in deeply using diaphragmatic breathing. 3. Try to hold your breath for 3 seconds. 4. Keep your mouth slightly open while coughing twice. 5. Spit any mucus out into a tissue. 6. Rest and repeat the steps once or twice as needed. General instructions  Make sure you receive all the vaccines that your health care provider recommends, especially the pneumococcal and influenza vaccines. Preventing infection and hospitalization is very important when you have COPD.  Use oxygen therapy and pulmonary rehabilitation if directed to by your health care provider. If you require home oxygen therapy, ask your health care provider whether you should purchase a pulse oximeter to measure your oxygen level at  home.  Work with your health care provider to develop a COPD action plan. This will help you know what steps to take if your condition gets worse.  Keep other chronic health conditions under control as told by your health care provider.  Avoid extreme temperature and humidity changes.  Avoid contact with people who have an illness that spreads from person to person (is contagious), such as viral infections or pneumonia.  Keep all follow-up visits as told by your health care provider. This is important. Contact a health care provider if:  You are coughing up more mucus than usual.  There is a change in the color or thickness of your mucus.  Your breathing is more labored than usual.  Your breathing is faster than usual.  You have difficulty sleeping.  You need to use your rescue medicines or inhalers more often than expected.  You have trouble doing routine activities such as getting dressed or walking around the house. Get help right away if:  You have shortness of breath while you are resting.  You have shortness of breath that prevents you from: ? Being able to talk. ? Performing your usual physical activities.  You have chest pain lasting longer than 5 minutes.  Your skin color is more blue (cyanotic) than usual.  You measure low oxygen saturations for longer than 5 minutes with a pulse oximeter.  You have a fever.  You feel too tired to breathe normally. Summary  Chronic obstructive   pulmonary disease (COPD) is a long-term (chronic) condition that affects the lungs.  Your lung function will probably never return to normal. In most cases, it gets worse over time. However, there are steps you can take to slow the progression of the disease and improve your quality of life.  Treatment for COPD may include taking medicines, quitting smoking, pulmonary rehabilitation, and changes to diet and exercise. As the disease progresses, you may need oxygen therapy, a lung  transplant, or palliative care.  To help manage your condition, do not smoke, avoid exposure to things that irritate your lungs, stay up to date on all vaccines, and follow your health care provider's instructions for taking medicines. This information is not intended to replace advice given to you by your health care provider. Make sure you discuss any questions you have with your health care provider. Document Revised: 11/14/2017 Document Reviewed: 01/06/2017 Elsevier Patient Education  2021 [place].  Heart Attack The heart is a muscle that needs oxygen to survive. A heart attack is a condition that occurs when your heart does not get enough oxygen. When this happens, the heart muscle begins to die. This can cause permanent damage if not treated right away. A heart attack is a medical emergency. This condition may be called a myocardial infarction, or MI. It is also known as acute coronary syndrome (ACS). ACS is a term used to describe a group of conditions that affect blood flow to the heart. What are the causes? This condition may be caused by:  Atherosclerosis. This occurs when a fatty substance called plaque builds up in the arteries and blocks or reduces blood supply to the heart.  A blood clot. A blood clot can develop suddenly when plaque breaks up within an artery and blocks blood flow to the heart.  Low blood pressure.  An abnormal heartbeat (arrhythmia).  Conditions that cause a decrease of oxygen to the heart, such as anemiaorrespiratory failure.  A spasm, or severe tightening, of a blood vessel that cuts off blood flow to the heart.  Tearing of a coronary artery (spontaneous coronary artery dissection).  High blood pressure.   What increases the risk? The following factors may make you more likely to develop this condition:  Aging. The older you are, the higher your risk.  Having a personal or family history of chest pain, heart attack, stroke, or narrowing of the  arteries in the legs, arms, head, or stomach (peripheral artery disease).  Being female.  Smoking.  Not getting regular exercise.  Being overweight or obese.  Having high blood pressure.  Having high cholesterol (hypercholesterolemia).  Having diabetes.  Drinking too much alcohol.  Using illegal drugs, such as cocaine or methamphetamine. What are the signs or symptoms? Symptoms of this condition may vary, depending on factors like gender and age. Symptoms may include:  Chest pain. It may feel like: ? Crushing or squeezing. ? Tightness, pressure, fullness, or heaviness.  Pain in the arm, neck, jaw, back, or upper body.  Shortness of breath.  Heartburn or upset stomach.  Nausea.  Sudden cold sweats.  Feeling tired.  Sudden light-headedness. How is this diagnosed? This condition may be diagnosed through tests, such as:  Electrocardiogram (ECG) to measure the electrical activity of your heart.  Blood tests to check for cardiac markers. These chemicals are released by a damaged heart muscle.  A test to evaluate blood flow and heart function (coronary angiogram).  CT scan to see the heart more clearly.  A  test to evaluate the pumping action of the heart (echocardiogram). How is this treated? A heart attack must be treated as soon as possible. Treatment may include:  Medicines to: ? Break up or dissolve blood clots (fibrinolytic therapy). ? Thin blood and help prevent blood clots. ? Treat blood pressure. ? Improve blood flow to the heart. ? Reduce pain. ? Reduce cholesterol.  Angioplasty and stent placement. These are procedures to widen a blocked artery and keep it open.  Coronary artery bypass graft, CABG, or open heart surgery. This enables blood to flow to the heart by going around the blocked part of the artery.  Oxygen therapy if needed.  Cardiac rehabilitation. This improves your health and well-being through exercise, education, and counseling.    Follow these instructions at home: Medicines  Take over-the-counter and prescription medicines only as told by your health care provider.  Do not take the following medicines unless your health care provider says it is okay to take them: ? NSAIDs, such as ibuprofen. ? Supplements that contain vitamin A, vitamin E, or both. ? Hormone replacement therapy that contains estrogen with or without progestin. Lifestyle  Do not use any products that contain nicotine or tobacco, such as cigarettes, e-cigarettes, and chewing tobacco. If you need help quitting, ask your health care provider.  Avoid secondhand smoke.  Exercise regularly. Ask your health care provider about participating in a cardiac rehabilitation program that helps you start exercising safely after a heart attack.  Eat a heart-healthy diet. Your health care provider will tell you what foods to eat.  Maintain a healthy weight.  Learn ways to manage stress.  Do not use illegal drugs.   Alcohol use  Do not drink alcohol if: ? Your health care provider tells you not to drink. ? You are pregnant, may be pregnant, or are planning to become pregnant.  If you drink alcohol: ? Limit how much you use to:  0-1 drink a day for women.  0-2 drinks a day for men. ? Be aware of how much alcohol is in your drink. In the [place] one drink equals one 12 oz bottle of beer (355 mL), one 5 oz glass of wine (148 mL), or one 1 oz glass of hard liquor (44 mL). General instructions  Work with your health care provider to manage any other conditions you have, such as high blood pressure or diabetes. These conditions affect your heart.  Get screened for depression, and seek treatment if needed.  Keep your vaccinations up to date. Get the flu vaccine every year.  Keep all follow-up visits as told by your health care provider. This is important. Contact a health care provider if:  You feel overwhelmed or sad.  You have trouble doing your  daily activities. Get help right away if:  You have sudden, unexplained discomfort in your chest, arms, back, neck, jaw, or upper body.  You have shortness of breath.  You suddenly start to sweat or your skin gets clammy.  You feel nauseous or you vomit.  You have unexplained tiredness or weakness.  You suddenly feel light-headed or dizzy.  You notice your heart starts to beat fast or feels like it is skipping beats.  You have blood pressure that is higher than 180/120. These symptoms may represent a serious problem that is an emergency. Do not wait to see if the symptoms will go away. Get medical help right away. Call your local emergency services (911 in the [place] Do not drive yourself  to the hospital. Summary  A heart attack, also called myocardial infarction, is a condition that occurs when your heart does not get enough oxygen. This is caused by anything that blocks or reduces blood flow to the heart.  Treatment is a combination of medicines and surgeries, if needed, to open the blocked arteries and restore blood flow to the heart.  A heart attack is an emergency. Get help right away if you have sudden discomfort in your chest, arms, back, neck, jaw, or upper body. Seek help if you feel nauseous, you vomit, or you feel light-headed or dizzy. This information is not intended to replace advice given to you by your health care provider. Make sure you discuss any questions you have with your health care provider. Document Revised: 03/11/2019 Document Reviewed: 03/15/2019 Elsevier Patient Education  [place].

## 2021-03-27 NOTE — Progress Notes (Signed)
Progress Note  Patient Name: Elaine Wright Date of Encounter: 03/27/2021  Primary Cardiologist: New - consult by [facility]  Subjective   No chest pain or SOB.  Supplemental oxygen has been weaned.  She feels like her breathing is back to baseline.  She has ambulated without difficulty.  Labs and vitals stable.    Inpatient Medications    Scheduled Meds: . arformoterol  15 mcg Nebulization BID  . aspirin  81 mg Per Tube Daily  . atorvastatin  80 mg Oral Daily  . budesonide (PULMICORT) nebulizer solution  0.25 mg Nebulization BID  . Chlorhexidine Gluconate Cloth  6 each Topical Daily  . dextromethorphan-guaiFENesin  1 tablet Oral BID  . enoxaparin (LOVENOX) injection  40 mg Subcutaneous Q24H  . insulin aspart  0-15 Units Subcutaneous TID AC & HS  . ipratropium-albuterol  3 mL Nebulization Q6H  . metoprolol succinate  25 mg Oral Daily  . pneumococcal 23 valent vaccine  0.5 mL Intramuscular Tomorrow-1000  . predniSONE  40 mg Oral Q breakfast  . sodium chloride flush  3 mL Intravenous Q12H  . ticagrelor  90 mg Per Tube BID   Continuous Infusions: . sodium chloride     PRN Meds: sodium chloride, acetaminophen, chlorpheniramine-HYDROcodone, docusate sodium, ipratropium-albuterol, ondansetron (ZOFRAN) IV, polyethylene glycol, sodium chloride flush   Vital Signs    Vitals:   03/26/21 2223 03/27/21 0148 03/27/21 0436 03/27/21 0738  BP: (!) 132/91  (!) 140/103   Pulse: 82  78   Resp: 18  18   Temp: 99.1 F (37.3 C)  98 F (36.7 C)   TempSrc: Oral  Oral   SpO2: 97% 96% 100% 97%  Weight:      Height:        Intake/Output Summary (Last 24 hours) at 03/27/2021 0911 Last data filed at 03/27/2021 0600 Gross per 24 hour  Intake 1732.82 ml  Output 1600 ml  Net 132.82 ml   Filed Weights   03/26/21 1600 03/26/21 1751  Weight: 66.7 kg 64.9 kg    Physical Exam   GEN: Well nourished, well developed, in no acute distress.  HEENT: Grossly normal.  Neck: Supple, mildly  elevated JVD, no carotid bruits, or masses. Cardiac: RRR, 2/6 SEM loudest @ upper sternal borders.  No murmurs, rubs, or gallops. No clubbing, cyanosis, edema.  Radials 2+, DP/PT 2+ and equal bilaterally. Right radial cardiac cath site without bleeding, bruit, hematoma, swelling, warmth, or erythema.  Radial pulse 2+. Respiratory: Mildly diminished breath sounds along the bilateral bases. GI: Soft, nontender, nondistended, BS + x 4. MS: no deformity or atrophy. Skin: warm and dry, no rash. Neuro:  Strength and sensation are intact. Psych: AAOx3.  Normal affect.  Labs    Chemistry Recent Labs  Lab 03/21/21 1554 03/24/21 0643 03/25/21 0400 03/26/21 0455 03/27/21 0419  NA 138 137 142 140 141  K 4.2 3.9 4.3 4.3 4.9  CL 105 100 107 103 101  CO2 24 28 24 29 30   GLUCOSE 111* 257* 158* 202* 185*  BUN 17 23 19 18 19   CREATININE 0.65 0.65 0.55 0.60 0.84  CALCIUM 9.2 8.6* 8.3* 8.1* 8.8*  PROT 7.2 8.1 6.1*  --   --   ALBUMIN 4.5 4.5 3.3*  --   --   AST 23 29 39  --   --   ALT 20 25 24   --   --   ALKPHOS 68 73 52  --   --   BILITOT 0.7 0.6  0.5  --   --   GFRNONAA >60 >60 >60 >60 >60  ANIONGAP 9 9 11 8 10      Hematology Recent Labs  Lab 03/24/21 0643 03/25/21 0400 03/26/21 0455  WBC 26.2* 20.6* 15.3*  RBC 5.19* 4.21 4.31  HGB 14.6 11.8* 12.1  HCT 45.0 36.8 37.2  MCV 86.7 87.4 86.3  MCH 28.1 28.0 28.1  MCHC 32.4 32.1 32.5  RDW 13.6 13.9 14.0  PLT 378 275 277    Cardiac Enzymes  Recent Labs  Lab 03/21/21 1554 03/24/21 0643 03/24/21 0924  TROPONINIHS 7 60* 1,126*      BNP Recent Labs  Lab 03/24/21 0643 03/25/21 0408  BNP 56.7 620.9*     HbA1c  Lab Results  Component Value Date   HGBA1C 6.9 (H) 03/24/2021    Radiology    DG Chest 2 View  Result Date: 03/21/2021 IMPRESSION: Right infrahilar atelectasis versus less likely developing infiltrate. Clinical correlation is recommended. Electronically Signed   By: Anner Crete M.D.   On: 03/21/2021 16:55    CT Soft Tissue Neck W Contrast  Result Date: 03/21/2021 IMPRESSION: 1. No acute abnormality identified in the neck. 2. 6 mm stone in the left submandibular duct with submandibular gland atrophy. Electronically Signed   By: Logan Bores M.D.   On: 03/21/2021 19:20    DG Chest Port 1 View  Result Date: 03/24/2021 IMPRESSION: 1. Endotracheal tube tip in satisfactory position projecting approximately 5 cm above the carina. 2. No acute cardiopulmonary disease. Electronically Signed   By: Evangeline Dakin M.D.   On: 03/24/2021 09:44    Telemetry    SR with rare blocked PAC - Personally Reviewed  ECG    No new tracings - Personally Reviewed  Cardiac Studies   Cardiac Catheterization and Percutaneous Coronary Intervention 4.9.2022  Left Anterior Descending  Vessel is large.  Ost LAD to Prox LAD lesion is 80% stenosed. The lesion is eccentric. Ultrasound (IVUS) was performed. Minimum lumen diameter: 3 mm. Severe plaque burden was detected. IVUS has determined that the lesion is calcified.      **The ostial/proximal LAD was successfully treated w/ a 3.0 x 12 mm Synergy DES**  Left Circumflex  Vessel is large.  Right Coronary Artery  Vessel is large.  _____________  2D Echocardiogram 03/25/2021: 1. Left ventricular ejection fraction, by estimation, is 60 to 65%. The  left ventricle has normal function. The left ventricle has no regional  wall motion abnormalities. There is mild concentric left ventricular  hypertrophy. Left ventricular diastolic  parameters were normal.  2. Right ventricular systolic function is normal. The right ventricular  size is normal.  3. The mitral valve is normal in structure. Trivial mitral valve  regurgitation. No evidence of mitral stenosis.  4. The aortic valve is tricuspid. Aortic valve regurgitation is not  visualized. No aortic stenosis is present.  _____________   Patient Profile     65 y.o. female w/ a h/o tob abuse, COPD, OA, DM, GERD, and  OSA, who was admitted 4/9 w/ chest pain, resp failure req intubation, and acute anterolateral STEMI. Now s/p PCI/DES to the ostial/prox LAD on 4/9.  Extubated to BiPAP 4/9.  Assessment & Plan    1.  Acute anterolateral STEMI/CAD:  Presented 4/9 with progressive dyspnea and chest tightness over a 10 day period.  Found to have anterolateral ST elevation and respiratory failure requiring intubation.  Cardiac cath revealed an 80% ostial/proximal LAD stenosis and otherwise normal coronary arteries.  She is now s/p PCI/DES to the ost/prox LAD. Extubated 4/9 and weaned off of BiPAP and [place].  No chest pain or dyspnea presently, though does remain on supplemental oxygen via nasal cannula.  Echo demonstrated preserved LV systolic function and normal wall motion.  Continue DAPT with ASA and Brilinta without interruption for least the next 12 months.  Continue low-dose metoprolol.  Post-cath instructions.  Cardiac rehab.  Can discharge today, we will arrange follow up in our office in 1 week.  2.  Acute resp failure/AECOPD:  Intubated on admission, extubated 4/9. Weaned off of BiPAP 4/10.  Currently on room air.  Steroids/nebs per pulm.  Feels like she is back to her baseline.    3.  Acute HFpEF: In the setting of her MI. Improved and back to baseline.  Echo with preserved LVSF as above.  4.  HL:  Cont high potency statin. Notes that she tried lipitor prev but stopped due to upset stomach.  Will continue for now but can change to alternate if necessary.  LDL 68 this admission.  5.  DMII:  A1c 6.9.  On metformin at home.  Look to add SGLT2i or GLP-1 agonist if not cost-prohibitive.  6.  Leukocytosis:  In setting of #2/steroids.  Afebrile.  7.  Tob Abuse:  40 yr hx but has been aggressively cutting back recently and quit 3-4 wks ago.    Signed, Christell Faith, PA-C  03/27/2021, 9:11 AM    For questions or updates, please contact   Please consult [email or web address] for contact info under Cardiology/STEMI.

## 2021-03-27 NOTE — Consult Note (Signed)
Index for Electrolyte Monitoring and Replacement   Recent Labs: Potassium (mmol/L)  Date Value  03/27/2021 4.9   Magnesium (mg/dL)  Date Value  03/27/2021 2.0   Calcium (mg/dL)  Date Value  03/27/2021 8.8 (L)   Albumin (g/dL)  Date Value  03/25/2021 3.3 (L)   Phosphorus (mg/dL)  Date Value  03/26/2021 3.7   Sodium (mmol/L)  Date Value  03/27/2021 141   Assessment: 65 y.o female with PMH as below presenting to [facility] ED with chief complaints of shortness of breath and chest pain. S/p Cath 4/9 -Successful PTCA/DES x 1 proximal LAD.     Goal of Therapy:  Potassium 4.0 - 5.1 mmol/L Magnesium 2.0 - 2.4 mg/dL All Other Electrolytes WNL  Plan:   No electrolyte replacement warranted for today   Consult placed while patient under CCM care. Patient care has now been transferred to [facility] and patient is out of the ICU. Will discontinue consult at this time  Pharmacy will continue to monitor peripherally. Will defer ordering of labs and electrolyte repletion to hospitalist  Benita Gutter 03/27/2021 7:26 AM

## 2021-03-27 NOTE — Discharge Summary (Signed)
Physician Discharge Summary  Elaine Wright PJS:[id number] DOB: June 20, 1956 DOA: 03/24/2021  PCP: Elaine Pitch, MD  Admit date: 03/24/2021 Discharge date: 03/27/2021  Admitted From: Home Disposition: Home  Recommendations for Outpatient Follow-up:  1. Follow up with PCP in 1-2 weeks 2. Follow-up cardiology 1 week 3. Referral made to pulmonology  Home Health: No Equipment/Devices: None Discharge Condition: Stable CODE STATUS: Full Diet recommendation: Heart Healthy Brief/Interim Summary: 65 y.o female presenting to [facility] ED on 4/9with c/oshortness of breath and chest pain. Patient was noted with significant wheezing in all lung fields for which she received 2 doses of albuterol treatment, 125 of Solu-Medroland placed on BiPAP. Code STEMI was called on route for worsening chest pain, patient received 325 mg of aspirin and 2 of nitro on route. Stat EKG was obtained which showed anterolateral ST elevation consistent with a STEMI. Cardiology was consulted for code STEMI who recommended patient to be intubated due to not being able to lay on cath lab table on BiPAP. Patient taken emergently toCath Lab for intervention and subsequently transferred to the ICU intubated.  Patient was intubated for airway protection.  She successfully extubated to BiPAP and as of 4/11 is on nasal cannula 2 L saturating very well.  Mentating clearly with no chest pain.  Seen by cardiology.  Will transfer to telemetry unit.  On day of discharge patient is saturating well on room air.  She is ambulating without difficulty.  She is seen by cardiology who are okay with discharge from their standpoint.  Will discharge home.  Prescriptions provided for dual antiplatelet therapy aspirin/Brilinta, high intensity statin, beta-blocker, ACE inhibitor.  For decompensated COPD prescribed prescription for prednisone 40 mg a day x5 days and made referral to outpatient [facility] to establish care.  Discharge  Diagnoses:  Active Problems:   STEMI (ST elevation myocardial infarction) (HCC)   Acute ST elevation myocardial infarction (STEMI) of anterior wall (HCC)  Acute STEMI Status post cardiac cath, PCI proximal LAD 4/9 Cardiology following Patient remained stable post cath Cardiology evaluated on the day of discharge Discharge plan Dual antiplatelet therapy aspirin and ticagrelor High-dose statin Metoprolol ACE inhibitor Follow-up with cardiology Dr. Saunders Revel in 1 to 2 weeks  Acute exacerbation of COPD Acute hypoxic/hypercarbic respiratory failure secondary to above Required intubation for airway protection Weaned to BiPAP and subsequently to nasal cannula Stable on nasal cannula with improvement in respiratory status Weaned to room air at time of discharge Discharge plan Prednisone 40 mg a day x5 days Continue home inhaler and nebulizer regimen Referral made to outpatient pulmonary to establish care  Acute metabolic encephalopathy Related to hypoxia, now resolved  Diabetes mellitus Can resume home regimen at time of discharge  Tobacco abuse 40-pack-year history Quit 3 to 4 weeks ago  Discharge Instructions  Discharge Instructions    Ambulatory referral to Pulmonology   Complete by: As directed    New patient, Establish care.  Dx COPD.  Any provider OK   Reason for referral: Asthma/COPD   Diet - low sodium heart healthy   Complete by: As directed    Increase activity slowly   Complete by: As directed      Allergies as of 03/27/2021      Reactions   Morphine And Related Other (See Comments)   Confusion, hallucinations      Medication List    TAKE these medications   albuterol 108 (90 Base) MCG/ACT inhaler Commonly known as: VENTOLIN HFA Inhale 2 puffs into the lungs every 6 (  six) hours as needed for wheezing or shortness of breath.   aspirin 81 MG chewable tablet Chew 1 tablet (81 mg total) by mouth daily. Start taking on: March 28, 2021   atorvastatin 80  MG tablet Commonly known as: LIPITOR Take 1 tablet (80 mg total) by mouth daily. Start taking on: March 28, 2021   furosemide 20 MG tablet Commonly known as: LASIX Take 1 tablet (20 mg total) by mouth daily. Start taking on: March 28, 2021   ipratropium-albuterol 0.5-2.5 (3) MG/3ML Soln Commonly known as: DUONEB USE 1 VIAL VIA NEBULIZER FOUR TIMES DAILY AS DIRECTED   metFORMIN 500 MG 24 hr tablet Commonly known as: GLUCOPHAGE-XR Take 1,000 mg by mouth daily. afternoon   metoprolol succinate 25 MG 24 hr tablet Commonly known as: TOPROL-XL Take 1 tablet (25 mg total) by mouth daily. Start taking on: March 28, 2021   metroNIDAZOLE 0.75 % cream Commonly known as: METROCREAM Apply topically at bedtime. qhs to face for Rosacea   omeprazole 20 MG capsule Commonly known as: PRILOSEC Take 1 capsule by mouth daily.   predniSONE 20 MG tablet Commonly known as: DELTASONE Take 2 tablets (40 mg total) by mouth daily with breakfast for 5 days. Start taking on: March 28, 2021 What changed: how much to take   ticagrelor 90 MG Tabs tablet Commonly known as: BRILINTA Take 1 tablet (90 mg total) by mouth 2 (two) times daily.       Follow-up Information    End, Harrell Gave, MD. Schedule an appointment as soon as possible for a visit on 04/03/2021.   Specialty: Cardiology Why: @ 9am Contact information: [address] [place] [address] [phone]        Elaine Pitch, MD. Schedule an appointment as soon as possible for a visit in 1 week.   Specialty: Family Medicine Why: Patient will need to make a follow up appointment. Contact information: [address] [address] [phone]              Allergies  Allergen Reactions  . Morphine And Related Other (See Comments)    Confusion, hallucinations    Consultations:  Cardiology   Procedures/Studies: DG Chest 2 View  Result Date: 03/21/2021 CLINICAL DATA:  65 year old female with  shortness of breath. EXAM: CHEST - 2 VIEW COMPARISON:  Chest CT dated 02/07/2021. FINDINGS: Right infrahilar density may represent atelectasis or crowding of the hilar structure. Developing infiltrate is less likely but not excluded clinical correlation is recommended. No pleural effusion, or pneumothorax. The cardiac silhouette is within limits. Atherosclerotic calcification of the aorta. Degenerative changes of the spine. No acute osseous pathology. IMPRESSION: Right infrahilar atelectasis versus less likely developing infiltrate. Clinical correlation is recommended. Electronically Signed   By: Anner Crete M.D.   On: 03/21/2021 16:55   CT Soft Tissue Neck W Contrast  Result Date: 03/21/2021 CLINICAL DATA:  Shortness of breath and throat tightness. Cough for 2 weeks. EXAM: CT NECK WITH CONTRAST TECHNIQUE: Multidetector CT imaging of the neck was performed using the standard protocol following the bolus administration of intravenous contrast. CONTRAST:  31mL OMNIPAQUE IOHEXOL 300 MG/ML  SOLN COMPARISON:  None. FINDINGS: Pharynx and larynx: No evidence of mass or swelling. No fluid collection or inflammatory changes in the parapharyngeal or retropharyngeal spaces. Salivary glands: Asymmetric fatty atrophy of the left submandibular gland. 6 mm stone in the distal left submandibular duct. Unremarkable appearance of the right submandibular and both parotid glands. Thyroid: Unremarkable. Lymph nodes:  No enlarged or suspicious lymph nodes in the neck. Vascular: Major vascular structures of the neck are grossly patent. Limited intracranial: Unremarkable. Visualized orbits: Bilateral cataract extraction. Mastoids and visualized paranasal sinuses: Mild mucosal thickening in the maxillary sinuses with bubbly secretions on the right. Clear mastoid air cells. Skeleton: Moderate mid and lower cervical disc degeneration. Mild cervical facet arthrosis. Upper chest: Clear lung apices. Other: None. IMPRESSION: 1. No acute  abnormality identified in the neck. 2. 6 mm stone in the left submandibular duct with submandibular gland atrophy. Electronically Signed   By: Logan Bores M.D.   On: 03/21/2021 19:20   CARDIAC CATHETERIZATION  Result Date: 03/24/2021  Ost LAD to Prox LAD lesion is 80% stenosed.  A drug-eluting stent was successfully placed using a STENT SYNERGY DES 3X12.  Post intervention, there is a 0% residual stenosis.  1. Severe proximal LAD stenosis best seen in the LAO view. This was confirmed with IVUS imaging. She likely became ischemic from this lesion when she became hypoxic and hypertensive at home. 2. Successful PTCA/DES x 1 proximal LAD 3. No obstructive disease in the left main, Circumflex or RCA 4. Elevated LVEDP Recommendations: Will admit to the ICU on the critical care team. She will remain intubated while her respiratory status is optimized. Continue Aggrastat drip for 2 hours post PCI. Will continue DAPT with ASA and Brilinta for at least one year. I will start a high intensity statin. One dose IV Lasix this am. Echo later today.   DG Chest Port 1 View  Result Date: 03/24/2021 CLINICAL DATA:  Current history of COPD, diabetes and sleep apnea, presenting with acute onset of chest pain with EKG demonstrating an anterior STEMI. Hypoxemia. Intubation. EXAM: PORTABLE CHEST 1 VIEW COMPARISON:  03/21/2021 and earlier. FINDINGS: Endotracheal tube tip in satisfactory position projecting approximately 5 cm above the carina. Cardiac silhouette normal in size, unchanged. Lungs clear. Pulmonary vascularity normal. No visible pleural effusions. IMPRESSION: 1. Endotracheal tube tip in satisfactory position projecting approximately 5 cm above the carina. 2. No acute cardiopulmonary disease. Electronically Signed   By: Evangeline Dakin M.D.   On: 03/24/2021 09:44   ECHOCARDIOGRAM COMPLETE  Result Date: 03/25/2021    ECHOCARDIOGRAM REPORT   Patient Name:   Elaine Wright Date of Exam: 03/25/2021 Medical Rec #:   [id number]       Height:       60.0 in Accession #:    [id number]      Weight:       147.0 lb Date of Birth:  1956/09/14      BSA:          1.638 m Patient Age:    65 years        BP:           96/74 mmHg Patient Gender: F               HR:           90 bpm. Exam Location:  [facility] Procedure: 2D Echo and Strain Analysis Indications:     CAD Native Vessel I25.10  History:         Patient has no prior history of Echocardiogram examinations.  Sonographer:     Arville Go RDCS Referring Phys:  [place] Diagnosing Phys: Buford Dresser MD  Sonographer Comments: Technically difficult study due to poor echo windows. IMPRESSIONS  1. Left ventricular ejection fraction, by estimation, is 60 to 65%. The left ventricle has normal function.  The left ventricle has no regional wall motion abnormalities. There is mild concentric left ventricular hypertrophy. Left ventricular diastolic parameters were normal.  2. Right ventricular systolic function is normal. The right ventricular size is normal.  3. The mitral valve is normal in structure. Trivial mitral valve regurgitation. No evidence of mitral stenosis.  4. The aortic valve is tricuspid. Aortic valve regurgitation is not visualized. No aortic stenosis is present. Comparison(s): No prior Echocardiogram. Conclusion(s)/Recommendation(s): Technically limited images. Grossly normal LVEF, no focal wall motion abnormalities appreciated. FINDINGS  Left Ventricle: Left ventricular ejection fraction, by estimation, is 60 to 65%. The left ventricle has normal function. The left ventricle has no regional wall motion abnormalities. Global longitudinal strain performed but not reported based on interpreter judgement due to suboptimal tracking. The left ventricular internal cavity size was normal in size. There is mild concentric left ventricular hypertrophy. Left ventricular diastolic parameters were normal. Right Ventricle: The right ventricular size is normal.  Right vetricular wall thickness was not well visualized. Right ventricular systolic function is normal. Left Atrium: Left atrial size was normal in size. Right Atrium: Right atrial size was normal in size. Pericardium: There is no evidence of pericardial effusion. Mitral Valve: The mitral valve is normal in structure. Trivial mitral valve regurgitation. No evidence of mitral valve stenosis. Tricuspid Valve: The tricuspid valve is normal in structure. Tricuspid valve regurgitation is trivial. No evidence of tricuspid stenosis. Aortic Valve: The aortic valve is tricuspid. Aortic valve regurgitation is not visualized. No aortic stenosis is present. Aortic valve peak gradient measures 9.2 mmHg. Pulmonic Valve: The pulmonic valve was grossly normal. Pulmonic valve regurgitation is not visualized. No evidence of pulmonic stenosis. Aorta: The aortic root and ascending aorta are structurally normal, with no evidence of dilitation. IAS/Shunts: The atrial septum is grossly normal.  LEFT VENTRICLE PLAX 2D LVIDd:         4.39 cm     Diastology LVIDs:         3.11 cm     LV e' medial:    7.62 cm/s LV PW:         1.24 cm     LV E/e' medial:  9.2 LV IVS:        1.10 cm     LV e' lateral:   6.53 cm/s LVOT diam:     1.90 cm     LV E/e' lateral: 10.8 LV SV:         67 LV SV Index:   41 LVOT Area:     2.84 cm  LV Volumes (MOD) LV vol d, MOD A4C: 58.1 ml LV vol s, MOD A4C: 25.7 ml LV SV MOD A4C:     58.1 ml LEFT ATRIUM             Index LA diam:        3.50 cm 2.14 cm/m LA Vol (A2C):   39.7 ml 24.24 ml/m LA Vol (A4C):   32.9 ml 20.09 ml/m LA Biplane Vol: 37.1 ml 22.65 ml/m  AORTIC VALVE                PULMONIC VALVE AV Area (Vmax): 2.09 cm    PV Vmax:       1.15 m/s AV Vmax:        152.00 cm/s PV Peak grad:  5.3 mmHg AV Peak Grad:   9.2 mmHg LVOT Vmax:      112.00 cm/s LVOT Vmean:     80.800 cm/s LVOT VTI:  0.237 m  AORTA Ao Root diam: 2.90 cm Ao Asc diam:  3.00 cm MITRAL VALVE MV Area (PHT): 5.34 cm     SHUNTS MV Decel  Time: 142 msec     Systemic VTI:  0.24 m MV E velocity: 70.30 cm/s   Systemic Diam: 1.90 cm MV A velocity: 119.00 cm/s MV E/A ratio:  0.59 Buford Dresser MD Electronically signed by Buford Dresser MD Signature Date/Time: 03/25/2021/5:30:23 PM    Final     (Echo, Carotid, EGD, Colonoscopy, ERCP)    Subjective: Seen and examined on the day of discharge.  Stable, no distress.  No complaints.  Denies chest pain palpitations abdominal pain shortness of breath.  Stable for discharge home.  Discharge Exam: Vitals:   03/27/21 0738 03/27/21 0915  BP:  (!) 133/96  Pulse:  96  Resp:  18  Temp:  98.2 F (36.8 C)  SpO2: 97% 97%   Vitals:   03/27/21 0148 03/27/21 0436 03/27/21 0738 03/27/21 0915  BP:  (!) 140/103  (!) 133/96  Pulse:  78  96  Resp:  18  18  Temp:  98 F (36.7 C)  98.2 F (36.8 C)  TempSrc:  Oral    SpO2: 96% 100% 97% 97%  Weight:      Height:        General: Pt is alert, awake, not in acute distress Cardiovascular: RRR, S1/S2 +, no rubs, no gallops Respiratory: CTA bilaterally, no wheezing, no rhonchi Abdominal: Soft, NT, ND, bowel sounds + Extremities: no edema, no cyanosis    The results of significant diagnostics from this hospitalization (including imaging, microbiology, ancillary and laboratory) are listed below for reference.     Microbiology: Recent Results (from the past 240 hour(s))  Resp Panel by RT-PCR (Flu A&B, Covid) Nasopharyngeal Swab     Status: None   Collection Time: 03/24/21  6:43 AM   Specimen: Nasopharyngeal Swab; Nasopharyngeal(NP) swabs in vial transport medium  Result Value Ref Range Status   SARS Coronavirus 2 by RT PCR NEGATIVE NEGATIVE Final    Comment: (NOTE) SARS-CoV-2 target nucleic acids are NOT DETECTED.  The SARS-CoV-2 RNA is generally detectable in upper respiratory specimens during the acute phase of infection. The lowest concentration of SARS-CoV-2 viral copies this assay can detect is 138 copies/mL. A  negative result does not preclude SARS-Cov-2 infection and should not be used as the sole basis for treatment or other patient management decisions. A negative result may occur with  improper specimen collection/handling, submission of specimen other than nasopharyngeal swab, presence of viral mutation(s) within the areas targeted by this assay, and inadequate number of viral copies(<138 copies/mL). A negative result must be combined with clinical observations, patient history, and epidemiological information. The expected result is Negative.  Fact Sheet for Patients:  EntrepreneurPulse.com.au  Fact Sheet for Healthcare Providers:  IncredibleEmployment.be  This test is no t yet approved or cleared by the [place] FDA and  has been authorized for detection and/or diagnosis of SARS-CoV-2 by FDA under an Emergency Use Authorization (EUA). This EUA will remain  in effect (meaning this test can be used) for the duration of the COVID-19 declaration under Section 564(b)(1) of the Act, 21 U.S.C.section 360bbb-3(b)(1), unless the authorization is terminated  or revoked sooner.       Influenza A by PCR NEGATIVE NEGATIVE Final   Influenza B by PCR NEGATIVE NEGATIVE Final    Comment: (NOTE) The Xpert Xpress SARS-CoV-2/FLU/RSV plus assay is intended as an aid in the  diagnosis of influenza from Nasopharyngeal swab specimens and should not be used as a sole basis for treatment. Nasal washings and aspirates are unacceptable for Xpert Xpress SARS-CoV-2/FLU/RSV testing.  Fact Sheet for Patients: EntrepreneurPulse.com.au  Fact Sheet for Healthcare Providers: IncredibleEmployment.be  This test is not yet approved or cleared by the [place] FDA and has been authorized for detection and/or diagnosis of SARS-CoV-2 by FDA under an Emergency Use Authorization (EUA). This EUA will remain in effect (meaning this test can  be used) for the duration of the COVID-19 declaration under Section 564(b)(1) of the Act, 21 U.S.C. section 360bbb-3(b)(1), unless the authorization is terminated or revoked.  Performed at [facility], [address] [place], [place] [address]   MRSA PCR Screening     Status: None   Collection Time: 03/24/21  9:41 AM   Specimen: Nasal Mucosa; Nasopharyngeal  Result Value Ref Range Status   MRSA by PCR NEGATIVE NEGATIVE Final    Comment:        The GeneXpert MRSA Assay (FDA approved for NASAL specimens only), is one component of a comprehensive MRSA colonization surveillance program. It is not intended to diagnose MRSA infection nor to guide or monitor treatment for MRSA infections. Performed at [facility], [address] [place], [place] [address]      Labs: BNP (last 3 results) Recent Labs    03/24/21 0643 03/25/21 0408  BNP 56.7 030.0*   Basic Metabolic Panel: Recent Labs  Lab 03/21/21 1554 03/24/21 0643 03/25/21 0400 03/26/21 0455 03/27/21 0419  NA 138 137 142 140 141  K 4.2 3.9 4.3 4.3 4.9  CL 105 100 107 103 101  CO2 24 28 24 29 30   GLUCOSE 111* 257* 158* 202* 185*  BUN 17 23 19 18 19   CREATININE 0.65 0.65 0.55 0.60 0.84  CALCIUM 9.2 8.6* 8.3* 8.1* 8.8*  MG  --   --  2.0 2.1 2.0  PHOS  --   --  2.4* 3.7  --    Liver Function Tests: Recent Labs  Lab 03/21/21 1554 03/24/21 0643 03/25/21 0400  AST 23 29 39  ALT 20 25 24   ALKPHOS 68 73 52  BILITOT 0.7 0.6 0.5  PROT 7.2 8.1 6.1*  ALBUMIN 4.5 4.5 3.3*   No results for input(s): LIPASE, AMYLASE in the last 168 hours. No results for input(s): AMMONIA in the last 168 hours. CBC: Recent Labs  Lab 03/21/21 1554 03/24/21 0643 03/25/21 0400 03/26/21 0455  WBC 12.1* 26.2* 20.6* 15.3*  NEUTROABS  --  19.5*  --   --   HGB 13.8 14.6 11.8* 12.1  HCT 42.4 45.0 36.8 37.2  MCV 86.4 86.7 87.4 86.3  PLT 284 378 275 277   Cardiac Enzymes: No results for input(s): CKTOTAL, CKMB,  CKMBINDEX, TROPONINI in the last 168 hours. BNP: Invalid input(s): POCBNP CBG: Recent Labs  Lab 03/26/21 1233 03/26/21 1622 03/26/21 2140 03/27/21 0810 03/27/21 1132  GLUCAP 225* 288* 233* 167* 124*   D-Dimer No results for input(s): DDIMER in the last 72 hours. Hgb A1c Recent Labs    03/24/21 1912  HGBA1C 6.9*   Lipid Profile Recent Labs    03/26/21 0455  CHOL 142  HDL 62  LDLCALC 68  TRIG 61  CHOLHDL 2.3   Thyroid function studies No results for input(s): TSH, T4TOTAL, T3FREE, THYROIDAB in the last 72 hours.  Invalid input(s): FREET3 Anemia work up No results for input(s): VITAMINB12, FOLATE, FERRITIN, TIBC, IRON, RETICCTPCT in the  last 72 hours. Urinalysis    Component Value Date/Time   COLORURINE YELLOW 12/17/2012 1211   APPEARANCEUR CLEAR 12/17/2012 1211   LABSPEC 1.009 12/17/2012 1211   PHURINE 6.5 12/17/2012 1211   GLUCOSEU NEGATIVE 12/17/2012 1211   HGBUR NEGATIVE 12/17/2012 1211   BILIRUBINUR NEGATIVE 12/17/2012 1211   KETONESUR NEGATIVE 12/17/2012 1211   PROTEINUR NEGATIVE 12/17/2012 1211   UROBILINOGEN 0.2 12/17/2012 1211   NITRITE NEGATIVE 12/17/2012 1211   LEUKOCYTESUR NEGATIVE 12/17/2012 1211   Sepsis Labs Invalid input(s): PROCALCITONIN,  WBC,  LACTICIDVEN Microbiology Recent Results (from the past 240 hour(s))  Resp Panel by RT-PCR (Flu A&B, Covid) Nasopharyngeal Swab     Status: None   Collection Time: 03/24/21  6:43 AM   Specimen: Nasopharyngeal Swab; Nasopharyngeal(NP) swabs in vial transport medium  Result Value Ref Range Status   SARS Coronavirus 2 by RT PCR NEGATIVE NEGATIVE Final    Comment: (NOTE) SARS-CoV-2 target nucleic acids are NOT DETECTED.  The SARS-CoV-2 RNA is generally detectable in upper respiratory specimens during the acute phase of infection. The lowest concentration of SARS-CoV-2 viral copies this assay can detect is 138 copies/mL. A negative result does not preclude SARS-Cov-2 infection and should not be used  as the sole basis for treatment or other patient management decisions. A negative result may occur with  improper specimen collection/handling, submission of specimen other than nasopharyngeal swab, presence of viral mutation(s) within the areas targeted by this assay, and inadequate number of viral copies(<138 copies/mL). A negative result must be combined with clinical observations, patient history, and epidemiological information. The expected result is Negative.  Fact Sheet for Patients:  EntrepreneurPulse.com.au  Fact Sheet for Healthcare Providers:  IncredibleEmployment.be  This test is no t yet approved or cleared by the [place] FDA and  has been authorized for detection and/or diagnosis of SARS-CoV-2 by FDA under an Emergency Use Authorization (EUA). This EUA will remain  in effect (meaning this test can be used) for the duration of the COVID-19 declaration under Section 564(b)(1) of the Act, 21 U.S.C.section 360bbb-3(b)(1), unless the authorization is terminated  or revoked sooner.       Influenza A by PCR NEGATIVE NEGATIVE Final   Influenza B by PCR NEGATIVE NEGATIVE Final    Comment: (NOTE) The Xpert Xpress SARS-CoV-2/FLU/RSV plus assay is intended as an aid in the diagnosis of influenza from Nasopharyngeal swab specimens and should not be used as a sole basis for treatment. Nasal washings and aspirates are unacceptable for Xpert Xpress SARS-CoV-2/FLU/RSV testing.  Fact Sheet for Patients: EntrepreneurPulse.com.au  Fact Sheet for Healthcare Providers: IncredibleEmployment.be  This test is not yet approved or cleared by the [place] FDA and has been authorized for detection and/or diagnosis of SARS-CoV-2 by FDA under an Emergency Use Authorization (EUA). This EUA will remain in effect (meaning this test can be used) for the duration of the COVID-19 declaration under Section 564(b)(1)  of the Act, 21 U.S.C. section 360bbb-3(b)(1), unless the authorization is terminated or revoked.  Performed at [facility], [address] [place], [place] [address]   MRSA PCR Screening     Status: None   Collection Time: 03/24/21  9:41 AM   Specimen: Nasal Mucosa; Nasopharyngeal  Result Value Ref Range Status   MRSA by PCR NEGATIVE NEGATIVE Final    Comment:        The GeneXpert MRSA Assay (FDA approved for NASAL specimens only), is one component of a comprehensive MRSA colonization surveillance program. It is not intended  to diagnose MRSA infection nor to guide or monitor treatment for MRSA infections. Performed at [facility], [address] [place], [place] [address]      Time coordinating discharge: Over 30 minutes  SIGNED:   Sidney Ace, MD  [facility] 03/27/2021, 3:28 PM Pager   If 7PM-7AM, please contact night-coverage

## 2021-03-27 NOTE — Care Management Important Message (Signed)
Important Message  Patient Details  Name: Elaine Wright MRN: [id number] Date of Birth: [birth year]   Medicare Important Message Given:  Yes     Dannette Barbara 03/27/2021, 11:08 AM

## 2021-04-03 ENCOUNTER — Encounter: Payer: Self-pay | Admitting: Family

## 2021-04-03 ENCOUNTER — Other Ambulatory Visit: Payer: Self-pay

## 2021-04-03 ENCOUNTER — Ambulatory Visit: Payer: Medicare Other | Admitting: Family

## 2021-04-03 VITALS — BP 112/74 | HR 90 | Ht 61.0 in | Wt 136.0 lb

## 2021-04-03 DIAGNOSIS — E785 Hyperlipidemia, unspecified: Secondary | ICD-10-CM

## 2021-04-03 DIAGNOSIS — I5032 Chronic diastolic (congestive) heart failure: Secondary | ICD-10-CM

## 2021-04-03 DIAGNOSIS — J439 Emphysema, unspecified: Secondary | ICD-10-CM | POA: Diagnosis not present

## 2021-04-03 DIAGNOSIS — D72829 Elevated white blood cell count, unspecified: Secondary | ICD-10-CM

## 2021-04-03 DIAGNOSIS — Z720 Tobacco use: Secondary | ICD-10-CM

## 2021-04-03 DIAGNOSIS — I25118 Atherosclerotic heart disease of native coronary artery with other forms of angina pectoris: Secondary | ICD-10-CM | POA: Diagnosis not present

## 2021-04-03 MED ORDER — ATORVASTATIN CALCIUM 80 MG PO TABS: 80.0000 mg | ORAL_TABLET | Freq: Every day | ORAL | 1 refills | Status: DC

## 2021-04-03 MED ORDER — NITROGLYCERIN 0.4 MG SL SUBL: 0.4000 mg | SUBLINGUAL_TABLET | SUBLINGUAL | 3 refills | Status: DC | PRN

## 2021-04-03 MED ORDER — TICAGRELOR 90 MG PO TABS: 90.0000 mg | ORAL_TABLET | Freq: Two times a day (BID) | ORAL | 11 refills | Status: DC

## 2021-04-03 MED ORDER — METOPROLOL SUCCINATE ER 25 MG PO TB24: 25.0000 mg | ORAL_TABLET | Freq: Every day | ORAL | 1 refills | Status: DC

## 2021-04-03 NOTE — Patient Instructions (Addendum)
Medication Instructions:  Your physician has recommended you make the following change in your medication:  STOP Furosemide (Lasix) 20mg  daily  START Nitroglycerin as needed for chest pain  If you experience chest pain: Place 1 nitroglycerin under your tongue, while sitting. If no relief of pain, may repeat 1 tablet every 5 minutes up to 3 tablets over 15 minutes. If no relief call 911. If you have dizziness/lightheadedness while taking nitroglycerin, stop taking and call 911.    You may stop Omeprazole after 2 weeks of taking if you wish. If you have recurrent indigestion, simply resume.  CONTINUE  Aspirin/Brilinta to protect your stent  Metoprolol to help your heart relax and recover and prevent palpitations  Atorvastatin to help keep cholesterol numbers low and protect your stent  *If you need a refill on your cardiac medications before your next appointment, please call your pharmacy*  Lab Work: Your physician recommends that you return for lab work today: BMP, CBC  If you have labs (blood work) drawn today and your tests are completely normal, you will receive your results only by: Marland Kitchen MyChart Message (if you have MyChart) OR . A paper copy in the mail If you have any lab test that is abnormal or we need to change your treatment, we will call you to review the results.  Testing/Procedures: Your EKG today showed sinus rhythm with an occasional early beat.    Follow-Up: At [facility], you and your health needs are our priority.  As part of our continuing mission to provide you with exceptional heart care, we have created designated Provider Care Teams.  These Care Teams include your primary Cardiologist (physician) and Advanced Practice Providers (APPs -  Physician Assistants and Nurse Practitioners) who all work together to provide you with the care you need, when you need it.  We recommend signing up for the patient portal called "MyChart".  Sign up information is provided  on this After Visit Summary.  MyChart is used to connect with patients for Virtual Visits (Telemedicine).  Patients are able to view lab/test results, encounter notes, upcoming appointments, etc.  Non-urgent messages can be sent to your provider as well.   To learn more about what you can do with MyChart, go to [email or web address].    Your next appointment:   4 week(s)  The format for your next appointment:   In Person  Provider:   You may see Nelva Bush, MD or one of the following Advanced Practice Providers on your designated Care Team:    Murray Hodgkins, NP  Christell Faith, PA-C  Marrianne Mood, PA-C  Cadence [employer] [place]  Laurann Montana, NP  Other Instructions  You have been referred to cardiac rehab. This is a combination program including monitored exercise, dietary education, and support group. We strongly recommend participating in the program. Expect a phone call from them in approximately 1-2 weeks. If you do not hear from them, the phone number for cardiac rehab at [facility] is [phone].   Heart Healthy Diet Recommendations: A low-salt diet is recommended. Meats should be grilled, baked, or boiled. Avoid fried foods. Focus on lean protein sources like fish or chicken with vegetables and fruits. The American Heart Association is a Microbiologist!  American Heart Association Diet and Lifeystyle Recommendations   Exercise recommendations: The American Heart Association recommends 150 minutes of moderate intensity exercise weekly. Try 30 minutes of moderate intensity exercise 4-5 times per week. This could include walking, jogging, or swimming.  To prevent palpitations: Make  sure you are adequately hydrated.  Avoid and/or limit caffeine containing beverages like soda or tea. Exercise regularly.  Manage stress well. Some over the counter medications can cause palpitations such as Benadryl, AdvilPM, TylenolPM. Regular Advil or Tylenol do not cause palpitations.

## 2021-04-03 NOTE — Progress Notes (Signed)
Office Visit    Patient Name: Elaine Wright Date of Encounter: 04/03/2021  PCP:  [name], MD   [place]  Cardiologist:  Nelva Bush, MD  Advanced Practice Provider:  No care team member to display Electrophysiologist:  None   Chief Complaint    Elaine Wright is a 65 y.o. female with a hx of CAD s/p anterolateral STEMI with PCI/DES to ost/prox LAD, osteoarthritis, DM2, GERD, OSA, HLD, HFpEF, tobacco use presents today for hospital follow-up  Past Medical History    Past Medical History:  Diagnosis Date  . Arthritis   . Diabetes mellitus without complication ([place])   . GERD (gastroesophageal reflux disease)   . Sleep apnea    Past Surgical History:  Procedure Laterality Date  . ABDOMINAL HYSTERECTOMY  2003  . ANTERIOR AND POSTERIOR REPAIR N/A 04/14/2020   Procedure: ANTERIOR (CYSTOCELE) AND POSTERIOR REPAIR (RECTOCELE);  Surgeon: Schermerhorn, Gwen Her, MD;  Location: [facility] ORS;  Service: Gynecology;  Laterality: N/A;  . CORONARY/GRAFT ACUTE MI REVASCULARIZATION N/A 03/24/2021   Procedure: Coronary/Graft Acute MI Revascularization;  Surgeon: Burnell Blanks, MD;  Location: [place] [facility];  Service: Cardiovascular;  Laterality: N/A;  . EYE SURGERY  2011   Cataract bil  . LEFT HEART CATH AND CORONARY ANGIOGRAPHY N/A 03/24/2021   Procedure: LEFT HEART CATH AND CORONARY ANGIOGRAPHY;  Surgeon: Burnell Blanks, MD;  Location: [place];  Service: Cardiovascular;  Laterality: N/A;  . PARTIAL KNEE ARTHROPLASTY  2009   right   . Thumb joint     Tendon from arm to replace arthrititic thumb  . TOTAL KNEE ARTHROPLASTY  12/23/2012   Procedure: TOTAL KNEE ARTHROPLASTY;  Surgeon: Ninetta Lights, MD;  Location: [facility];  Service: Orthopedics;  Laterality: Left;  . TRACHELECTOMY N/A 04/14/2020   Procedure: LAPAROSCOPIC TRACHELECTOMY;  Surgeon: Schermerhorn, Gwen Her, MD;  Location: [facility];  Service: Gynecology;   Laterality: N/A;    Allergies  Allergies  Allergen Reactions  . Morphine And Related Other (See Comments)    Confusion, hallucinations    History of Present Illness    Elaine Wright is a 65 y.o. female with a hx of CAD s/p anterolateral STEMI with PCI/DES to ost/prox LAD, osteoarthritis, DM2, GERD, OSA, HLD, HFpEF, tobacco use last seen while hospitalized.  She presented to [facility] ED [facility]/24/2021 with shortness of breath and chest pain with code STEMI called in route.  EKG with anterolateral ST elevation.  She was taken emergently to the cath lab after intubation with finding that ostial proximal LAD 80% stenosed.  Ostial/proximal LAD was successfully treated with DES.  Echo 03/25/2021 LVEF 60 to 65%, no R WMA, mild LVH, normal diastolic function, RV normal size and function, trivial MR.  She was recently treated for COPD exacerbation with prednisone as well as acute diastolic heart failure with diuretic.  He was recommended for DAPT for at least 1 year and to start a high intensity statin.  Presents today for follow-up.  Reports no chest pain, pressure, tightness since discharge.  We reviewed her hospitalization and cardiac testing in detail and she was appreciative of the explanation.  She has returned to some of her normal activities such as housework since hospital discharge.  She enjoys caning in her spare time. She is hoping to get back to her exercise regimen at the [facility].  Does note some intermittent palpitations since hospital discharge.  Tells me her shortness of breath is overall improving.  Does note persistent cough for which she saw her primary care yesterday and was provided with Tessalon Perles.  Denies edema, orthopnea, PND.  EKGs/Labs/Other Studies Reviewed:   The following studies were reviewed today:  Cardiac Catheterization 03/24/21 Conclusion  Ost LAD to Prox LAD lesion is 80% stenosed.  A drug-eluting stent was successfully placed using a STENT SYNERGY DES 3X12.  Post  intervention, there is a 0% residual stenosis.   1. Severe proximal LAD stenosis best seen in the LAO view. This was confirmed with IVUS imaging. She likely became ischemic from this lesion when she became hypoxic and hypertensive at home.  2. Successful PTCA/DES x 1 proximal LAD 3. No obstructive disease in the left main, Circumflex or RCA 4. Elevated LVEDP  Recommendations: Will admit to the ICU on the critical care team. She will remain intubated while her respiratory status is optimized. Continue Aggrastat drip for 2 hours post PCI. Will continue DAPT with ASA and Brilinta for at least one year. I will start a high intensity statin. One dose IV Lasix this am. Echo later today.   Coronary Diagrams Diagnostic Dominance: Right    Intervention     _____________   2D Echocardiogram 03/25/2021: 1. Left ventricular ejection fraction, by estimation, is 60 to 65%. The  left ventricle has normal function. The left ventricle has no regional  wall motion abnormalities. There is mild concentric left ventricular  hypertrophy. Left ventricular diastolic  parameters were normal.   2. Right ventricular systolic function is normal. The right ventricular  size is normal.   3. The mitral valve is normal in structure. Trivial mitral valve  regurgitation. No evidence of mitral stenosis.   4. The aortic valve is tricuspid. Aortic valve regurgitation is not  visualized. No aortic stenosis is present.  _____________   EKG:  EKG is ordered today.  The ekg ordered today demonstrates sinus rhythm 90 bpm with occasional PAC and T wave inversion noted in V2, V4 - V6.  Recent Labs: 03/25/2021: ALT 24; B Natriuretic Peptide 620.9 03/26/2021: Hemoglobin 12.1; Platelets 277 03/27/2021: BUN 19; Creatinine, Ser 0.84; Magnesium 2.0; Potassium 4.9; Sodium 141  Recent Lipid Panel    Component Value Date/Time   CHOL 142 03/26/2021 0455   TRIG 61 03/26/2021 0455   HDL 62 03/26/2021 0455   CHOLHDL 2.3  03/26/2021 0455   VLDL 12 03/26/2021 0455   LDLCALC 68 03/26/2021 0455    Home Medications   Current Meds  Medication Sig  . albuterol (VENTOLIN HFA) 108 (90 Base) MCG/ACT inhaler Inhale 2 puffs into the lungs every 6 (six) hours as needed for wheezing or shortness of breath.  Marland Kitchen alendronate (FOSAMAX) 70 MG tablet Take 70 mg by mouth once a week.  Marland Kitchen aspirin 81 MG chewable tablet Chew 1 tablet (81 mg total) by mouth daily.  . benzonatate (TESSALON) 100 MG capsule Take by mouth.  Marland Kitchen ipratropium-albuterol (DUONEB) 0.5-2.5 (3) MG/3ML SOLN USE 1 VIAL VIA NEBULIZER FOUR TIMES DAILY AS DIRECTED  . metFORMIN (GLUCOPHAGE-XR) 500 MG 24 hr tablet Take 1,000 mg by mouth daily. afternoon  . metroNIDAZOLE (METROCREAM) 0.75 % cream Apply topically at bedtime. qhs to face for Rosacea  . Multiple Vitamin (MULTI-VITAMIN) tablet Take 1 tablet by mouth daily.  . nitroGLYCERIN (NITROSTAT) 0.4 MG SL tablet Place 1 tablet (0.4 mg total) under the tongue every 5 (five) minutes as needed for chest pain.  Marland Kitchen omeprazole (PRILOSEC) 20 MG capsule Take 1 capsule by mouth daily.  . [DISCONTINUED] atorvastatin (  LIPITOR) 80 MG tablet Take 1 tablet (80 mg total) by mouth daily.  . [DISCONTINUED] furosemide (LASIX) 20 MG tablet Take 1 tablet (20 mg total) by mouth daily.  . [DISCONTINUED] metoprolol succinate (TOPROL-XL) 25 MG 24 hr tablet Take 1 tablet (25 mg total) by mouth daily.  . [DISCONTINUED] ticagrelor (BRILINTA) 90 MG TABS tablet Take 1 tablet (90 mg total) by mouth 2 (two) times daily.     Review of Systems  All other systems reviewed and are otherwise negative except as noted above.  Physical Exam    VS:  BP 112/74 (BP Location: Left Arm, Patient Position: Sitting, Cuff Size: Normal)   Pulse 90   Ht 5\' 1"  (1.549 m)   Wt 136 lb (61.7 kg)   SpO2 97%   BMI 25.70 kg/m  , BMI Body mass index is 25.7 kg/m.  Wt Readings from Last 3 Encounters:  04/03/21 136 lb (61.7 kg)  03/26/21 143 lb (64.9 kg)  03/21/21  147 lb (66.7 kg)    GEN: Well nourished, well developed, in no acute distress. HEENT: normal. Neck: Supple, no JVD, carotid bruits, or masses. Cardiac: RRR, no murmurs, rubs, or gallops. No clubbing, cyanosis, edema.  Radials/PT 2+ and equal bilaterally.  Respiratory:  Respirations regular and unlabored, clear to auscultation bilaterally. GI: Soft, nontender, nondistended. MS: No deformity or atrophy. Skin: Warm and dry, no rash. R wrist cardiac catheterization.  Mild ecchymosis. Neuro:  Strength and sensation are intact. Psych: Normal affect.  Assessment & Plan    1. CAD s/p anterolateral STEMI with PCI/DES to ost/prox LAD - Right radial catheterization site healing appropriately with only mild ecchymosis.  EKG today shows T wave inversion in V2, V4-V6 consistent with prior STEMI and DES to LAD changes.  She has had no recurrent anginal symptoms.  Her GDMT includes DAPT aspirin/Brilinta for at least 1 year, metoprolol, atorvastatin, PRN nitroglycerin.  Refills provided.  Encouraged to participate in cardiac rehab, referral previously placed during hospitalization.  Heart healthy diet and regular cardiovascular exercise encouraged.  Has upcoming BMP with her primary care provider for monitoring.  2. COPD - Continue to follow with primary care provider. She does note cough and I anticipate it is due to her COPD as it is persistent throughout the day. Low suspicion Brilinta contributory but if cough does not resolve could consider trial transition to Plavix.  3. HFpEF -in the setting of a STEMI and elevated LVEDP on cardiac cath required IV diuresis during recent admission.  Echo 03/25/2021 normal LVEF 60 to 65% and normal diastolic parameters.  She is euvolemic on exam and reports improvement in her dyspnea.  Discontinue Lasix as anticipate is dehydrating her and causing her some intermittent palpitations.  4. HLD - 03/26/21 LDL 68. Continue high intensity statin Atorvastatin 80mg  daily in setting  of CAD. Plan for LFT/lipids at follow up.   5. DM2 - 03/24/21 A1c 6.9.  Continue to follow with PCP.  6. Leukocytosis -in the setting of prednisone use during recent hospitalization.  Upcoming CBC with PCP for monitoring.  7. Tobacco use - Congratulated on quitting smoking.  Utilized smoking cessation program through [facility].  Disposition: Follow up in 4 week(s) with Dr. Saunders Revel or APP.  Signed, Loel Dubonnet, NP 04/03/2021, 10:03 AM [place]

## 2021-04-04 ENCOUNTER — Telehealth: Payer: Self-pay | Admitting: *Deleted

## 2021-04-04 LAB — CBC
Hematocrit: 45.7 % (ref 34.0–46.6)
Hemoglobin: 15.4 g/dL (ref 11.1–15.9)
MCH: 28.8 pg (ref 26.6–33.0)
MCHC: 33.7 g/dL (ref 31.5–35.7)
MCV: 85 fL (ref 79–97)
Platelets: 344 10*3/uL (ref 150–450)
RBC: 5.35 x10E6/uL — ABNORMAL HIGH (ref 3.77–5.28)
RDW: 13.2 % (ref 11.7–15.4)
WBC: 17.2 10*3/uL — ABNORMAL HIGH (ref 3.4–10.8)

## 2021-04-04 LAB — BASIC METABOLIC PANEL
BUN/Creatinine Ratio: 17 (ref 12–28)
BUN: 15 mg/dL (ref 8–27)
CO2: 26 mmol/L (ref 20–29)
Calcium: 9.3 mg/dL (ref 8.7–10.3)
Chloride: 97 mmol/L (ref 96–106)
Creatinine, Ser: 0.9 mg/dL (ref 0.57–1.00)
Glucose: 215 mg/dL — ABNORMAL HIGH (ref 65–99)
Potassium: 4.1 mmol/L (ref 3.5–5.2)
Sodium: 140 mmol/L (ref 134–144)
eGFR: 71 mL/min/{1.73_m2} (ref >59)

## 2021-04-04 NOTE — Telephone Encounter (Signed)
Patient returning call.

## 2021-04-04 NOTE — Telephone Encounter (Signed)
Spoke with patient and inquired if the nebulizer treatments helped with the chest heaviness and shortness of breath. She reports that it does help and she also uses Vicks vapor rub as well. She states before the COPD she did have bronchitis as well which she feels this is the cause. Reviewed that if these symptoms persist, of chest heaviness and shortness of breath, to go to ED for review. Patient states she will call to see if she can get her pulmonary appointment sooner. She verbalized understanding with no further questions at this time.

## 2021-04-04 NOTE — Telephone Encounter (Signed)
Left voicemail message to call back for review with patient.

## 2021-04-04 NOTE — Telephone Encounter (Signed)
Noted. Agree with recommendations. Anticipate symptoms are likely pulmonary in etiology. Recommend present to ED for new or worsening shortness of breath.  Loel Dubonnet, NP

## 2021-04-04 NOTE — Telephone Encounter (Signed)
-----   Message from Loel Dubonnet, NP sent at 04/04/2021  9:30 AM EDT ----- She did have COPD exacerbation treated with Prednisone during recent admission. Anticipate this is etiology of discomfort. However, if she has persistent chest heaviness and shortness of breath she should be evaluated in the emergency department. Please inquire of nebulizers offered any relief overnight.   Await call back from patient.   Loel Dubonnet, NP  ----- Message ----- From: Valora Corporal, RN Sent: 04/04/2021   9:10 AM EDT To: Loel Dubonnet, NP  Reviewed results with patient and she reports chest heaviness and nebulizer treatments all night for that. She did inquire if she should try the nitroglycerin and advised that she could to see if it helps. Instructed her to please call back if the nitro causes the pain to go away. She verbalized understanding of our conversation, no further questions at this time, and understanding of instructions.

## 2021-04-05 ENCOUNTER — Inpatient Hospital Stay
Admission: EM | Admit: 2021-04-05 | Discharge: 2021-04-07 | DRG: 190 | Disposition: A | Discharge status: Home / Self Care | Payer: Medicare Other | Attending: Internal Medicine | Admitting: Internal Medicine

## 2021-04-05 ENCOUNTER — Emergency Department: Payer: Medicare Other

## 2021-04-05 ENCOUNTER — Other Ambulatory Visit: Payer: Self-pay

## 2021-04-05 DIAGNOSIS — R778 Other specified abnormalities of plasma proteins: Secondary | ICD-10-CM | POA: Diagnosis present

## 2021-04-05 DIAGNOSIS — R7989 Other specified abnormal findings of blood chemistry: Secondary | ICD-10-CM | POA: Diagnosis present

## 2021-04-05 DIAGNOSIS — G4730 Sleep apnea, unspecified: Secondary | ICD-10-CM | POA: Diagnosis present

## 2021-04-05 DIAGNOSIS — G4733 Obstructive sleep apnea (adult) (pediatric): Secondary | ICD-10-CM | POA: Diagnosis present

## 2021-04-05 DIAGNOSIS — T380X5A Adverse effect of glucocorticoids and synthetic analogues, initial encounter: Secondary | ICD-10-CM | POA: Diagnosis present

## 2021-04-05 DIAGNOSIS — K222 Esophageal obstruction: Secondary | ICD-10-CM

## 2021-04-05 DIAGNOSIS — E119 Type 2 diabetes mellitus without complications: Secondary | ICD-10-CM | POA: Diagnosis not present

## 2021-04-05 DIAGNOSIS — E785 Hyperlipidemia, unspecified: Secondary | ICD-10-CM | POA: Diagnosis present

## 2021-04-05 DIAGNOSIS — E1169 Type 2 diabetes mellitus with other specified complication: Secondary | ICD-10-CM | POA: Diagnosis present

## 2021-04-05 DIAGNOSIS — Z7984 Long term (current) use of oral hypoglycemic drugs: Secondary | ICD-10-CM

## 2021-04-05 DIAGNOSIS — I252 Old myocardial infarction: Secondary | ICD-10-CM

## 2021-04-05 DIAGNOSIS — Z955 Presence of coronary angioplasty implant and graft: Secondary | ICD-10-CM

## 2021-04-05 DIAGNOSIS — Z9842 Cataract extraction status, left eye: Secondary | ICD-10-CM

## 2021-04-05 DIAGNOSIS — R0602 Shortness of breath: Secondary | ICD-10-CM | POA: Diagnosis not present

## 2021-04-05 DIAGNOSIS — Z9841 Cataract extraction status, right eye: Secondary | ICD-10-CM

## 2021-04-05 DIAGNOSIS — K224 Dyskinesia of esophagus: Secondary | ICD-10-CM | POA: Diagnosis present

## 2021-04-05 DIAGNOSIS — Z79899 Other long term (current) drug therapy: Secondary | ICD-10-CM

## 2021-04-05 DIAGNOSIS — Z96651 Presence of right artificial knee joint: Secondary | ICD-10-CM | POA: Diagnosis present

## 2021-04-05 DIAGNOSIS — Z20822 Contact with and (suspected) exposure to covid-19: Secondary | ICD-10-CM | POA: Diagnosis present

## 2021-04-05 DIAGNOSIS — E1165 Type 2 diabetes mellitus with hyperglycemia: Secondary | ICD-10-CM | POA: Diagnosis present

## 2021-04-05 DIAGNOSIS — Z87891 Personal history of nicotine dependence: Secondary | ICD-10-CM

## 2021-04-05 DIAGNOSIS — Z885 Allergy status to narcotic agent status: Secondary | ICD-10-CM

## 2021-04-05 DIAGNOSIS — R0600 Dyspnea, unspecified: Secondary | ICD-10-CM

## 2021-04-05 DIAGNOSIS — J441 Chronic obstructive pulmonary disease with (acute) exacerbation: Principal | ICD-10-CM | POA: Diagnosis present

## 2021-04-05 DIAGNOSIS — J9601 Acute respiratory failure with hypoxia: Secondary | ICD-10-CM | POA: Diagnosis present

## 2021-04-05 DIAGNOSIS — Z7982 Long term (current) use of aspirin: Secondary | ICD-10-CM

## 2021-04-05 DIAGNOSIS — I2510 Atherosclerotic heart disease of native coronary artery without angina pectoris: Secondary | ICD-10-CM | POA: Diagnosis present

## 2021-04-05 DIAGNOSIS — I248 Other forms of acute ischemic heart disease: Secondary | ICD-10-CM | POA: Diagnosis present

## 2021-04-05 DIAGNOSIS — K219 Gastro-esophageal reflux disease without esophagitis: Secondary | ICD-10-CM | POA: Diagnosis present

## 2021-04-05 LAB — RESP PANEL BY RT-PCR (FLU A&B, COVID) ARPGX2
Influenza A by PCR: NEGATIVE
Influenza B by PCR: NEGATIVE
SARS Coronavirus 2 by RT PCR: NEGATIVE

## 2021-04-05 LAB — TROPONIN I (HIGH SENSITIVITY)
Troponin I (High Sensitivity): 23 ng/L — ABNORMAL HIGH (ref <18)
Troponin I (High Sensitivity): 26 ng/L — ABNORMAL HIGH (ref <18)
Troponin I (High Sensitivity): 22 ng/L — ABNORMAL HIGH (ref <18)
Troponin I (High Sensitivity): 19 ng/L — ABNORMAL HIGH (ref <18)

## 2021-04-05 LAB — HIV ANTIBODY (ROUTINE TESTING W REFLEX): HIV Screen 4th Generation wRfx: NONREACTIVE

## 2021-04-05 LAB — COMPREHENSIVE METABOLIC PANEL
ALT: 41 U/L (ref 0–44)
AST: 33 U/L (ref 15–41)
Albumin: 4.1 g/dL (ref 3.5–5.0)
Alkaline Phosphatase: 77 U/L (ref 38–126)
Anion gap: 10 (ref 5–15)
BUN: 12 mg/dL (ref 8–23)
CO2: 28 mmol/L (ref 22–32)
Calcium: 9.2 mg/dL (ref 8.9–10.3)
Chloride: 99 mmol/L (ref 98–111)
Creatinine, Ser: 0.6 mg/dL (ref 0.44–1.00)
GFR, Estimated: >60 mL/min (ref >60)
Glucose, Bld: 251 mg/dL — ABNORMAL HIGH (ref 70–99)
Potassium: 4.4 mmol/L (ref 3.5–5.1)
Sodium: 137 mmol/L (ref 135–145)
Total Bilirubin: 1 mg/dL (ref 0.3–1.2)
Total Protein: 7.4 g/dL (ref 6.5–8.1)

## 2021-04-05 LAB — CBC
HCT: 45.9 % (ref 36.0–46.0)
Hemoglobin: 15.2 g/dL — ABNORMAL HIGH (ref 12.0–15.0)
MCH: 28.3 pg (ref 26.0–34.0)
MCHC: 33.1 g/dL (ref 30.0–36.0)
MCV: 85.3 fL (ref 80.0–100.0)
Platelets: 323 10*3/uL (ref 150–400)
RBC: 5.38 MIL/uL — ABNORMAL HIGH (ref 3.87–5.11)
RDW: 13.7 % (ref 11.5–15.5)
WBC: 14.6 10*3/uL — ABNORMAL HIGH (ref 4.0–10.5)
nRBC: 0 % (ref 0.0–0.2)

## 2021-04-05 LAB — GLUCOSE, CAPILLARY: Glucose-Capillary: 299 mg/dL — ABNORMAL HIGH (ref 70–99)

## 2021-04-05 LAB — CBG MONITORING, ED: Glucose-Capillary: 194 mg/dL — ABNORMAL HIGH (ref 70–99)

## 2021-04-05 LAB — BRAIN NATRIURETIC PEPTIDE: B Natriuretic Peptide: 37.9 pg/mL (ref 0.0–100.0)

## 2021-04-05 LAB — D-DIMER, QUANTITATIVE: D-Dimer, Quant: 0.45 ug/mL-FEU (ref 0.00–0.50)

## 2021-04-05 MED ORDER — AZITHROMYCIN 500 MG PO TABS
500.0000 mg | ORAL_TABLET | Freq: Every day | ORAL | Status: AC
  Administered 2021-04-05: 500 mg via ORAL
  Filled 2021-04-05: qty 1

## 2021-04-05 MED ORDER — MENTHOL 3 MG MT LOZG
1.0000 | LOZENGE | OROMUCOSAL | Status: DC | PRN
  Administered 2021-04-05: 3 mg via ORAL
  Filled 2021-04-05: qty 9

## 2021-04-05 MED ORDER — IPRATROPIUM-ALBUTEROL 0.5-2.5 (3) MG/3ML IN SOLN
3.0000 mL | Freq: Once | RESPIRATORY_TRACT | Status: AC
  Administered 2021-04-05: 3 mL via RESPIRATORY_TRACT
  Filled 2021-04-05: qty 3

## 2021-04-05 MED ORDER — METHYLPREDNISOLONE SODIUM SUCC 125 MG IJ SOLR
125.0000 mg | Freq: Once | INTRAMUSCULAR | Status: AC
  Administered 2021-04-05: 125 mg via INTRAVENOUS
  Filled 2021-04-05: qty 2

## 2021-04-05 MED ORDER — INSULIN ASPART 100 UNIT/ML ~~LOC~~ SOLN
0.0000 [IU] | Freq: Every day | SUBCUTANEOUS | Status: DC
  Administered 2021-04-05: 3 [IU] via SUBCUTANEOUS
  Filled 2021-04-05 (×2): qty 1

## 2021-04-05 MED ORDER — ACETAMINOPHEN 325 MG PO TABS: 650.0000 mg | ORAL_TABLET | Freq: Four times a day (QID) | ORAL | Status: DC | PRN

## 2021-04-05 MED ORDER — DM-GUAIFENESIN ER 30-600 MG PO TB12
1.0000 | ORAL_TABLET | Freq: Two times a day (BID) | ORAL | Status: DC | PRN
  Administered 2021-04-06 (×2): 1 via ORAL
  Filled 2021-04-05 (×2): qty 1

## 2021-04-05 MED ORDER — ONDANSETRON HCL 4 MG/2ML IJ SOLN
4.0000 mg | Freq: Three times a day (TID) | INTRAMUSCULAR | Status: DC | PRN
Start: 2021-04-05 — End: 2021-04-07
  Administered 2021-04-05: 4 mg via INTRAVENOUS
  Filled 2021-04-05 (×2): qty 2

## 2021-04-05 MED ORDER — METHYLPREDNISOLONE SODIUM SUCC 40 MG IJ SOLR
40.0000 mg | Freq: Two times a day (BID) | INTRAMUSCULAR | Status: DC
  Administered 2021-04-05 – 2021-04-06 (×2): 40 mg via INTRAVENOUS
  Filled 2021-04-05 (×2): qty 1

## 2021-04-05 MED ORDER — ENOXAPARIN SODIUM 40 MG/0.4ML ~~LOC~~ SOLN
40.0000 mg | SUBCUTANEOUS | Status: DC
  Administered 2021-04-05 – 2021-04-06 (×2): 40 mg via SUBCUTANEOUS
  Filled 2021-04-05 (×2): qty 0.4

## 2021-04-05 MED ORDER — ALBUTEROL SULFATE (2.5 MG/3ML) 0.083% IN NEBU: 2.5000 mg | INHALATION_SOLUTION | RESPIRATORY_TRACT | Status: DC | PRN

## 2021-04-05 MED ORDER — IPRATROPIUM-ALBUTEROL 0.5-2.5 (3) MG/3ML IN SOLN
3.0000 mL | Freq: Four times a day (QID) | RESPIRATORY_TRACT | Status: DC
  Administered 2021-04-06 (×4): 3 mL via RESPIRATORY_TRACT
  Filled 2021-04-05 (×4): qty 3

## 2021-04-05 MED ORDER — AZITHROMYCIN 250 MG PO TABS
250.0000 mg | ORAL_TABLET | Freq: Every day | ORAL | Status: DC
  Administered 2021-04-06 – 2021-04-07 (×2): 250 mg via ORAL
  Filled 2021-04-05 (×2): qty 1

## 2021-04-05 MED ORDER — HYDRALAZINE HCL 20 MG/ML IJ SOLN: 5.0000 mg | INTRAMUSCULAR | Status: DC | PRN

## 2021-04-05 MED ORDER — IPRATROPIUM-ALBUTEROL 0.5-2.5 (3) MG/3ML IN SOLN
3.0000 mL | RESPIRATORY_TRACT | Status: DC
  Administered 2021-04-05 (×3): 3 mL via RESPIRATORY_TRACT
  Filled 2021-04-05 (×3): qty 3

## 2021-04-05 MED ORDER — ENOXAPARIN SODIUM 40 MG/0.4ML ~~LOC~~ SOLN: 40.0000 mg | SUBCUTANEOUS | Status: DC

## 2021-04-05 MED ORDER — INSULIN ASPART 100 UNIT/ML ~~LOC~~ SOLN
0.0000 [IU] | Freq: Three times a day (TID) | SUBCUTANEOUS | Status: DC
  Administered 2021-04-05: 2 [IU] via SUBCUTANEOUS
  Administered 2021-04-05: 9 [IU] via SUBCUTANEOUS
  Administered 2021-04-06 (×2): 3 [IU] via SUBCUTANEOUS
  Administered 2021-04-06: 4 [IU] via SUBCUTANEOUS
  Administered 2021-04-07: 2 [IU] via SUBCUTANEOUS
  Filled 2021-04-05 (×6): qty 1

## 2021-04-05 NOTE — ED Notes (Signed)
Informed RN bed assigned 1329

## 2021-04-05 NOTE — Consult Note (Signed)
Pulmonary Medicine          Date: 04/05/2021,   MRN# [id number] DAVANEE KLINKNER [birth year]     AdmissionWeight: 62.6 kg                 CurrentWeight: 62.6 kg   Referring physician: Dr Blaine Hamper   CHIEF COMPLAINT:   Recurrent Acute exacerbation of COPD   HISTORY OF PRESENT ILLNESS   Elaine Wright is a 65 y.o. female with medical history significant of CAD, STEMI (s/p of PCI/DES), HLD, DM, former smoker, OSA on CPAP, GERD, present with SOB. She was discharged from hospital last week. She has COPD and quit smoking last month after 40years.  She has had chronic bronchitis and brings up phelgm daily. She lost 10lbs in last 2 months. States "I can do without eating no appetite".  She does not feel fatigued and is full of energy and does not have diaphoresis. She has never had hemoptysis.  CT chest 2/22 shows emphysema without nodules/masses infiltrate.     PAST MEDICAL HISTORY   Past Medical History:  Diagnosis Date  . Arthritis   . Diabetes mellitus without complication ([place])   . GERD (gastroesophageal reflux disease)   . Sleep apnea      SURGICAL HISTORY   Past Surgical History:  Procedure Laterality Date  . ABDOMINAL HYSTERECTOMY  2003  . ANTERIOR AND POSTERIOR REPAIR N/A 04/14/2020   Procedure: ANTERIOR (CYSTOCELE) AND POSTERIOR REPAIR (RECTOCELE);  Surgeon: Schermerhorn, Gwen Her, MD;  Location: [facility];  Service: Gynecology;  Laterality: N/A;  . CORONARY/GRAFT ACUTE MI REVASCULARIZATION N/A 03/24/2021   Procedure: Coronary/Graft Acute MI Revascularization;  Surgeon: Burnell Blanks, MD;  Location: [facility];  Service: Cardiovascular;  Laterality: N/A;  . EYE SURGERY  2011   Cataract bil  . LEFT HEART CATH AND CORONARY ANGIOGRAPHY N/A 03/24/2021   Procedure: LEFT HEART CATH AND CORONARY ANGIOGRAPHY;  Surgeon: Burnell Blanks, MD;  Location: [facility];  Service: Cardiovascular;  Laterality: N/A;  . PARTIAL KNEE ARTHROPLASTY   2009   right   . Thumb joint     Tendon from arm to replace arthrititic thumb  . TOTAL KNEE ARTHROPLASTY  12/23/2012   Procedure: TOTAL KNEE ARTHROPLASTY;  Surgeon: Ninetta Lights, MD;  Location: [facility];  Service: Orthopedics;  Laterality: Left;  . TRACHELECTOMY N/A 04/14/2020   Procedure: LAPAROSCOPIC TRACHELECTOMY;  Surgeon: Schermerhorn, Gwen Her, MD;  Location: [facility];  Service: Gynecology;  Laterality: N/A;     FAMILY HISTORY   Family History  Problem Relation Age of Onset  . Breast cancer Neg Hx      SOCIAL HISTORY   Social History   Tobacco Use  . Smoking status: Former Smoker    Packs/day: 0.50    Years: 45.00    Pack years: 22.50    Types: Cigarettes    Quit date: 03/11/2021    Years since quitting: 0.0  . Smokeless tobacco: Never Used  Vaping Use  . Vaping Use: Some days  . Substances: Nicotine, Flavoring  Substance Use Topics  . Alcohol use: Yes    Comment:  2 glasses of wine a month  . Drug use: No     MEDICATIONS    Home Medication:  Current Outpatient Rx  . Order #: [id number] Class: Normal  . Order #: [id number] Class: Historical Med  . Order #: [id number] Class: OTC  . Order #: [id number] Class: Normal  . Order #: [id number] Class: Historical Med  .  Order #: [id number] Class: Historical Med  . Order #: [id number] Class: Historical Med  . Order #: [id number] Class: Historical Med  . Order #: [id number] Class: Historical Med  . Order #: [id number] Class: Historical Med  . Order #: [id number] Class: Normal  . Order #: [id number] Class: Normal  . Order #: [id number] Class: Historical Med  . Order #: [id number] Class: Normal  . Order #: [id number] Class: Normal    Current Medication:  Current Facility-Administered Medications:  .  acetaminophen (TYLENOL) tablet 650 mg, 650 mg, Oral, Q6H PRN, Ivor Costa, MD .  albuterol (PROVENTIL) (2.5 MG/3ML) 0.083% nebulizer solution 2.5 mg, 2.5 mg, Nebulization, Q4H PRN, Ivor Costa, MD .  Margrett Rud azithromycin [facility]) tablet  500 mg, 500 mg, Oral, Daily, 500 mg at 04/05/21 1001 **FOLLOWED BY** [START ON 04/06/2021] azithromycin (ZITHROMAX) tablet 250 mg, 250 mg, Oral, Daily, Ivor Costa, MD .  dextromethorphan-guaiFENesin ([place] DM) 30-600 MG per 12 hr tablet 1 tablet, 1 tablet, Oral, BID PRN, Ivor Costa, MD .  enoxaparin (LOVENOX) injection 40 mg, 40 mg, Subcutaneous, Q24H, Ivor Costa, MD .  hydrALAZINE (APRESOLINE) injection 5 mg, 5 mg, Intravenous, Q2H PRN, Ivor Costa, MD .  insulin aspart (novoLOG) injection 0-5 Units, 0-5 Units, Subcutaneous, QHS, Niu, Xilin, MD .  insulin aspart (novoLOG) injection 0-9 Units, 0-9 Units, Subcutaneous, TID WC, Ivor Costa, MD, 9 Units at 04/05/21 1145 .  ipratropium-albuterol (DUONEB) 0.5-2.5 (3) MG/3ML nebulizer solution 3 mL, 3 mL, Nebulization, Q4H, Ivor Costa, MD, 3 mL at 04/05/21 1145 .  methylPREDNISolone sodium succinate (SOLU-MEDROL) 40 mg/mL injection 40 mg, 40 mg, Intravenous, Q12H, Ivor Costa, MD .  ondansetron [facility]) injection 4 mg, 4 mg, Intravenous, Q8H PRN, Ivor Costa, MD, 4 mg at 04/05/21 1022  Current Outpatient Medications:  .  albuterol (VENTOLIN HFA) 108 (90 Base) MCG/ACT inhaler, Inhale 2 puffs into the lungs every 6 (six) hours as needed for wheezing or shortness of breath. (Patient taking differently: Inhale 5 puffs into the lungs every 6 (six) hours as needed for wheezing or shortness of breath.), Disp: 8 g, Rfl: 0 .  alendronate (FOSAMAX) 70 MG tablet, Take 70 mg by mouth every Friday., Disp: , Rfl:  .  aspirin 81 MG chewable tablet, Chew 1 tablet (81 mg total) by mouth daily., Disp: , Rfl:  .  atorvastatin (LIPITOR) 80 MG tablet, Take 1 tablet (80 mg total) by mouth daily., Disp: 90 tablet, Rfl: 1 .  benzonatate (TESSALON) 100 MG capsule, Take 100 mg by mouth 3 (three) times daily as needed for cough., Disp: , Rfl:  .  Carboxymethylcellulose Sodium (THERATEARS OP), Place 1 drop into both eyes daily as needed (when using contacts)., Disp: , Rfl:  .   dextromethorphan-guaiFENesin (MUCINEX DM) 30-600 MG 12hr tablet, Take 1 tablet by mouth 2 (two) times daily as needed for cough., Disp: , Rfl:  .  guaiFENesin-codeine 100-10 MG/5ML syrup, Take 5 mLs by mouth every 6 (six) hours as needed for cough., Disp: , Rfl:  .  ipratropium-albuterol (DUONEB) 0.5-2.5 (3) MG/3ML SOLN, Inhale 3 mLs into the lungs 4 (four) times daily., Disp: , Rfl:  .  metFORMIN (GLUCOPHAGE-XR) 500 MG 24 hr tablet, Take 500 mg by mouth 2 (two) times daily., Disp: , Rfl:  .  metoprolol succinate (TOPROL-XL) 25 MG 24 hr tablet, Take 1 tablet (25 mg total) by mouth daily., Disp: 90 tablet, Rfl: 1 .  metroNIDAZOLE (METROCREAM) 0.75 % cream, Apply topically at bedtime. qhs to face for Rosacea (Patient taking differently: Apply 1 application topically at bedtime as needed (rosacea).),  Disp: 45 g, Rfl: 6 .  Multiple Vitamin (MULTI-VITAMIN) tablet, Take 1 tablet by mouth daily., Disp: , Rfl:  .  nitroGLYCERIN (NITROSTAT) 0.4 MG SL tablet, Place 1 tablet (0.4 mg total) under the tongue every 5 (five) minutes as needed for chest pain., Disp: 25 tablet, Rfl: 3 .  ticagrelor (BRILINTA) 90 MG TABS tablet, Take 1 tablet (90 mg total) by mouth 2 (two) times daily., Disp: 60 tablet, Rfl: 11    ALLERGIES   Morphine and related     REVIEW OF SYSTEMS    Review of Systems:  Gen:  Denies  fever, sweats, chills weigh loss  HEENT: Denies blurred vision, double vision, ear pain, eye pain, hearing loss, nose bleeds, sore throat Cardiac:  No dizziness, chest pain or heaviness, chest tightness,edema Resp:   Denies cough or sputum porduction, shortness of breath,wheezing, hemoptysis,  Gi: Denies swallowing difficulty, stomach pain, nausea or vomiting, diarrhea, constipation, bowel incontinence Gu:  Denies bladder incontinence, burning urine Ext:   Denies Joint pain, stiffness or swelling Skin: Denies  skin rash, easy bruising or bleeding or hives Endoc:  Denies polyuria, polydipsia ,  polyphagia or weight change Psych:   Denies depression, insomnia or hallucinations   Other:  All other systems negative   VS: BP 123/88   Pulse 88   Temp 97.7 F (36.5 C)   Resp (!) 24   Ht 5\' 1"  (1.549 m)   Wt 62.6 kg   SpO2 92%   BMI 26.07 kg/m      PHYSICAL EXAM    GENERAL:NAD, no fevers, chills, no weakness no fatigue HEAD: Normocephalic, atraumatic.  EYES: Pupils equal, round, reactive to light. Extraocular muscles intact. No scleral icterus.  MOUTH: Moist mucosal membrane. Dentition intact. No abscess noted.  EAR, NOSE, THROAT: Clear without exudates. No external lesions.  NECK: Supple. No thyromegaly. No nodules. No JVD.  PULMONARY: mild rhonchi.  CARDIOVASCULAR: S1 and S2. Regular rate and rhythm. No murmurs, rubs, or gallops. No edema. Pedal pulses 2+ bilaterally.  GASTROINTESTINAL: Soft, nontender, nondistended. No masses. Positive bowel sounds. No hepatosplenomegaly.  MUSCULOSKELETAL: No swelling, clubbing, or edema. Range of motion full in all extremities.  NEUROLOGIC: Cranial nerves II through XII are intact. No gross focal neurological deficits. Sensation intact. Reflexes intact.  SKIN: No ulceration, lesions, rashes, or cyanosis. Skin warm and dry. Turgor intact.  PSYCHIATRIC: Mood, affect within normal limits. The patient is awake, alert and oriented x 3. Insight, judgment intact.       IMAGING    DG Chest 2 View  Result Date: 03/21/2021 CLINICAL DATA:  65 year old female with shortness of breath. EXAM: CHEST - 2 VIEW COMPARISON:  Chest CT dated 02/07/2021. FINDINGS: Right infrahilar density may represent atelectasis or crowding of the hilar structure. Developing infiltrate is less likely but not excluded clinical correlation is recommended. No pleural effusion, or pneumothorax. The cardiac silhouette is within limits. Atherosclerotic calcification of the aorta. Degenerative changes of the spine. No acute osseous pathology. IMPRESSION: Right infrahilar  atelectasis versus less likely developing infiltrate. Clinical correlation is recommended. Electronically Signed   By: Anner Crete M.D.   On: 03/21/2021 16:55   CT Soft Tissue Neck W Contrast  Result Date: 03/21/2021 CLINICAL DATA:  Shortness of breath and throat tightness. Cough for 2 weeks. EXAM: CT NECK WITH CONTRAST TECHNIQUE: Multidetector CT imaging of the neck was performed using the standard protocol following the bolus administration of intravenous contrast. CONTRAST:  97mL OMNIPAQUE IOHEXOL 300 MG/ML  SOLN COMPARISON:  None. FINDINGS: Pharynx and larynx: No evidence of mass or swelling. No fluid collection or inflammatory changes in the parapharyngeal or retropharyngeal spaces. Salivary glands: Asymmetric fatty atrophy of the left submandibular gland. 6 mm stone in the distal left submandibular duct. Unremarkable appearance of the right submandibular and both parotid glands. Thyroid: Unremarkable. Lymph nodes: No enlarged or suspicious lymph nodes in the neck. Vascular: Major vascular structures of the neck are grossly patent. Limited intracranial: Unremarkable. Visualized orbits: Bilateral cataract extraction. Mastoids and visualized paranasal sinuses: Mild mucosal thickening in the maxillary sinuses with bubbly secretions on the right. Clear mastoid air cells. Skeleton: Moderate mid and lower cervical disc degeneration. Mild cervical facet arthrosis. Upper chest: Clear lung apices. Other: None. IMPRESSION: 1. No acute abnormality identified in the neck. 2. 6 mm stone in the left submandibular duct with submandibular gland atrophy. Electronically Signed   By: Logan Bores M.D.   On: 03/21/2021 19:20   CARDIAC CATHETERIZATION  Result Date: 03/24/2021  Ost LAD to Prox LAD lesion is 80% stenosed.  A drug-eluting stent was successfully placed using a STENT SYNERGY DES 3X12.  Post intervention, there is a 0% residual stenosis.  1. Severe proximal LAD stenosis best seen in the LAO view. This was  confirmed with IVUS imaging. She likely became ischemic from this lesion when she became hypoxic and hypertensive at home. 2. Successful PTCA/DES x 1 proximal LAD 3. No obstructive disease in the left main, Circumflex or RCA 4. Elevated LVEDP Recommendations: Will admit to the ICU on the critical care team. She will remain intubated while her respiratory status is optimized. Continue Aggrastat drip for 2 hours post PCI. Will continue DAPT with ASA and Brilinta for at least one year. I will start a high intensity statin. One dose IV Lasix this am. Echo later today.   DG Chest Portable 1 View  Result Date: 04/05/2021 CLINICAL DATA:  Shortness of breath. EXAM: PORTABLE CHEST 1 VIEW COMPARISON:  03/24/2021 FINDINGS: Normal sized heart. Clear lungs. Thoracic spine degenerative changes and mild scoliosis. IMPRESSION: No acute abnormality. Electronically Signed   By: Claudie Revering M.D.   On: 04/05/2021 08:57   DG Chest Port 1 View  Result Date: 03/24/2021 CLINICAL DATA:  Current history of COPD, diabetes and sleep apnea, presenting with acute onset of chest pain with EKG demonstrating an anterior STEMI. Hypoxemia. Intubation. EXAM: PORTABLE CHEST 1 VIEW COMPARISON:  03/21/2021 and earlier. FINDINGS: Endotracheal tube tip in satisfactory position projecting approximately 5 cm above the carina. Cardiac silhouette normal in size, unchanged. Lungs clear. Pulmonary vascularity normal. No visible pleural effusions. IMPRESSION: 1. Endotracheal tube tip in satisfactory position projecting approximately 5 cm above the carina. 2. No acute cardiopulmonary disease. Electronically Signed   By: Evangeline Dakin M.D.   On: 03/24/2021 09:44   ECHOCARDIOGRAM COMPLETE  Result Date: 03/25/2021    ECHOCARDIOGRAM REPORT   Patient Name:   SHAUNIKA ITALIANO Date of Exam: 03/25/2021 Medical Rec #:  [id number]       Height:       60.0 in Accession #:    [id number]      Weight:       147.0 lb Date of Birth:  March 27, 1956      BSA:           1.638 m Patient Age:    59 years        BP:           96/74 mmHg Patient Gender: F  HR:           90 bpm. Exam Location:  [facility] Procedure: 2D Echo and Strain Analysis Indications:     CAD Native Vessel I25.10  History:         Patient has no prior history of Echocardiogram examinations.  Sonographer:     Arville Go RDCS Referring Phys:  [place] Diagnosing Phys: Buford Dresser MD  Sonographer Comments: Technically difficult study due to poor echo windows. IMPRESSIONS  1. Left ventricular ejection fraction, by estimation, is 60 to 65%. The left ventricle has normal function. The left ventricle has no regional wall motion abnormalities. There is mild concentric left ventricular hypertrophy. Left ventricular diastolic parameters were normal.  2. Right ventricular systolic function is normal. The right ventricular size is normal.  3. The mitral valve is normal in structure. Trivial mitral valve regurgitation. No evidence of mitral stenosis.  4. The aortic valve is tricuspid. Aortic valve regurgitation is not visualized. No aortic stenosis is present. Comparison(s): No prior Echocardiogram. Conclusion(s)/Recommendation(s): Technically limited images. Grossly normal LVEF, no focal wall motion abnormalities appreciated. FINDINGS  Left Ventricle: Left ventricular ejection fraction, by estimation, is 60 to 65%. The left ventricle has normal function. The left ventricle has no regional wall motion abnormalities. Global longitudinal strain performed but not reported based on interpreter judgement due to suboptimal tracking. The left ventricular internal cavity size was normal in size. There is mild concentric left ventricular hypertrophy. Left ventricular diastolic parameters were normal. Right Ventricle: The right ventricular size is normal. Right vetricular wall thickness was not well visualized. Right ventricular systolic function is normal. Left Atrium: Left atrial size was  normal in size. Right Atrium: Right atrial size was normal in size. Pericardium: There is no evidence of pericardial effusion. Mitral Valve: The mitral valve is normal in structure. Trivial mitral valve regurgitation. No evidence of mitral valve stenosis. Tricuspid Valve: The tricuspid valve is normal in structure. Tricuspid valve regurgitation is trivial. No evidence of tricuspid stenosis. Aortic Valve: The aortic valve is tricuspid. Aortic valve regurgitation is not visualized. No aortic stenosis is present. Aortic valve peak gradient measures 9.2 mmHg. Pulmonic Valve: The pulmonic valve was grossly normal. Pulmonic valve regurgitation is not visualized. No evidence of pulmonic stenosis. Aorta: The aortic root and ascending aorta are structurally normal, with no evidence of dilitation. IAS/Shunts: The atrial septum is grossly normal.  LEFT VENTRICLE PLAX 2D LVIDd:         4.39 cm     Diastology LVIDs:         3.11 cm     LV e' medial:    7.62 cm/s LV PW:         1.24 cm     LV E/e' medial:  9.2 LV IVS:        1.10 cm     LV e' lateral:   6.53 cm/s LVOT diam:     1.90 cm     LV E/e' lateral: 10.8 LV SV:         67 LV SV Index:   41 LVOT Area:     2.84 cm  LV Volumes (MOD) LV vol d, MOD A4C: 58.1 ml LV vol s, MOD A4C: 25.7 ml LV SV MOD A4C:     58.1 ml LEFT ATRIUM             Index LA diam:        3.50 cm 2.14 cm/m LA Vol (A2C):   39.7  ml 24.24 ml/m LA Vol (A4C):   32.9 ml 20.09 ml/m LA Biplane Vol: 37.1 ml 22.65 ml/m  AORTIC VALVE                PULMONIC VALVE AV Area (Vmax): 2.09 cm    PV Vmax:       1.15 m/s AV Vmax:        152.00 cm/s PV Peak grad:  5.3 mmHg AV Peak Grad:   9.2 mmHg LVOT Vmax:      112.00 cm/s LVOT Vmean:     80.800 cm/s LVOT VTI:       0.237 m  AORTA Ao Root diam: 2.90 cm Ao Asc diam:  3.00 cm MITRAL VALVE MV Area (PHT): 5.34 cm     SHUNTS MV Decel Time: 142 msec     Systemic VTI:  0.24 m MV E velocity: 70.30 cm/s   Systemic Diam: 1.90 cm MV A velocity: 119.00 cm/s MV E/A ratio:  0.59  Buford Dresser MD Electronically signed by Buford Dresser MD Signature Date/Time: 03/25/2021/5:30:23 PM    Final       ASSESSMENT/PLAN   Acute exacerbation of COPD -patient reports worsening phlegm production in high volume and dyspnea after stopping steroids -BNP is normal   -shes already improved post IV steroids and nebulizer -agree with zithromax Patient feels now close to baseline Incentive spirometry and PT/OT    Acute hypoxemic respiratory failure  - may wean O2 to Room air  - due to AE COPD -ddimer to rule out PE since she had acute onset SOB -procalcitonin    OSA on CPAP -will order QHS CPAP orders by RT     Thank you for allowing me to participate in the care of this patient.   Patient/Family are satisfied with care plan and all questions have been answered.  This document was prepared using Dragon voice recognition software and may include unintentional dictation errors.     Ottie Glazier, M.D.  Division of [facility]

## 2021-04-05 NOTE — ED Provider Notes (Signed)
[facility] Emergency Department Provider Note    Event Date/Time   First MD Initiated Contact with Patient 04/05/21 [phone]     (approximate)  I have reviewed the triage vital signs and the nursing notes.   HISTORY  Chief Complaint Shortness of Breath    HPI Elaine Wright is a 65 y.o. female below listed past medical history with complicated recent past medical history including ICU admission intubation for ACS status post drug-eluting stent the beginning of this month presenting to the ER for several days of worsening shortness of breath orthopnea and exertional dyspnea.  Does wear CPAP at home.  Just recently had Lasix stopped at the beginning of the week.  Feels like symptoms have worsened since then.  Does have some tightness in her chest.  Has been compliant with her medications.  Denies any fevers.  No nausea or vomiting.    Past Medical History:  Diagnosis Date  . Arthritis   . Diabetes mellitus without complication ([place])   . GERD (gastroesophageal reflux disease)   . Sleep apnea    Family History  Problem Relation Age of Onset  . Breast cancer Neg Hx    Past Surgical History:  Procedure Laterality Date  . ABDOMINAL HYSTERECTOMY  2003  . ANTERIOR AND POSTERIOR REPAIR N/A 04/14/2020   Procedure: ANTERIOR (CYSTOCELE) AND POSTERIOR REPAIR (RECTOCELE);  Surgeon: Schermerhorn, Gwen Her, MD;  Location: [facility]S;  Service: Gynecology;  Laterality: N/A;  . CORONARY/GRAFT ACUTE MI REVASCULARIZATION N/A 03/24/2021   Procedure: Coronary/Graft Acute MI Revascularization;  Surgeon: Burnell Blanks, MD;  Location: [place] [facility];  Service: Cardiovascular;  Laterality: N/A;  . EYE SURGERY  2011   Cataract bil  . LEFT HEART CATH AND CORONARY ANGIOGRAPHY N/A 03/24/2021   Procedure: LEFT HEART CATH AND CORONARY ANGIOGRAPHY;  Surgeon: Burnell Blanks, MD;  Location: [place] [facility];  Service: Cardiovascular;  Laterality: N/A;  . PARTIAL  KNEE ARTHROPLASTY  2009   right   . Thumb joint     Tendon from arm to replace arthrititic thumb  . TOTAL KNEE ARTHROPLASTY  12/23/2012   Procedure: TOTAL KNEE ARTHROPLASTY;  Surgeon: Ninetta Lights, MD;  Location: [place];  Service: Orthopedics;  Laterality: Left;  . TRACHELECTOMY N/A 04/14/2020   Procedure: LAPAROSCOPIC TRACHELECTOMY;  Surgeon: Schermerhorn, Gwen Her, MD;  Location: [facility];  Service: Gynecology;  Laterality: N/A;   Patient Active Problem List   Diagnosis Date Noted  . STEMI (ST elevation myocardial infarction) ([place]) 03/24/2021  . Acute ST elevation myocardial infarction (STEMI) of anterior wall ([place]) 03/24/2021  . Acute ST elevation myocardial infarction (STEMI) involving left anterior descending (LAD) coronary artery ([facility])       Prior to Admission medications   Medication Sig Start Date End Date Taking? Authorizing Provider  albuterol (VENTOLIN HFA) 108 (90 Base) MCG/ACT inhaler Inhale 2 puffs into the lungs every 6 (six) hours as needed for wheezing or shortness of breath. 03/21/21   Blake Divine, MD  alendronate (FOSAMAX) 70 MG tablet Take 70 mg by mouth once a week. 02/07/21   [provider]  aspirin 81 MG chewable tablet Chew 1 tablet (81 mg total) by mouth daily. 03/28/21   Sidney Ace, MD  atorvastatin (LIPITOR) 80 MG tablet Take 1 tablet (80 mg total) by mouth daily. 04/03/21 09/30/21  Loel Dubonnet, NP  benzonatate (TESSALON) 100 MG capsule Take by mouth. 04/02/21 04/12/21  [provider]  ipratropium-albuterol (DUONEB) 0.5-2.5 (  3) MG/3ML SOLN USE 1 VIAL VIA NEBULIZER FOUR TIMES DAILY AS DIRECTED 03/22/21   [provider]  metFORMIN (GLUCOPHAGE-XR) 500 MG 24 hr tablet Take 1,000 mg by mouth daily. afternoon 03/04/20   [provider]  metoprolol succinate (TOPROL-XL) 25 MG 24 hr tablet Take 1 tablet (25 mg total) by mouth daily. 04/03/21 09/30/21  Loel Dubonnet, NP  metroNIDAZOLE (METROCREAM) 0.75 % cream Apply  topically at bedtime. qhs to face for Rosacea 02/05/21 02/05/22  Brendolyn Patty, MD  Multiple Vitamin (MULTI-VITAMIN) tablet Take 1 tablet by mouth daily.    [provider]  nitroGLYCERIN (NITROSTAT) 0.4 MG SL tablet Place 1 tablet (0.4 mg total) under the tongue every 5 (five) minutes as needed for chest pain. 04/03/21 07/02/21  Loel Dubonnet, NP  omeprazole (PRILOSEC) 20 MG capsule Take 1 capsule by mouth daily. 03/21/21   [provider]  ticagrelor (BRILINTA) 90 MG TABS tablet Take 1 tablet (90 mg total) by mouth 2 (two) times daily. 04/03/21 03/29/22  Loel Dubonnet, NP    Allergies Morphine and related    Social History Social History   Tobacco Use  . Smoking status: Former Smoker    Packs/day: 0.50    Years: 45.00    Pack years: 22.50    Types: Cigarettes    Quit date: 03/11/2021    Years since quitting: 0.0  . Smokeless tobacco: Never Used  Vaping Use  . Vaping Use: Some days  . Substances: Nicotine, Flavoring  Substance Use Topics  . Alcohol use: Yes    Comment:  2 glasses of wine a month  . Drug use: No    Review of Systems Patient denies headaches, rhinorrhea, blurry vision, numbness, shortness of breath, chest pain, edema, cough, abdominal pain, nausea, vomiting, diarrhea, dysuria, fevers, rashes or hallucinations unless otherwise stated above in HPI. ____________________________________________   PHYSICAL EXAM:  VITAL SIGNS: Vitals:   04/05/21 0830 04/05/21 0849  BP: 112/84   Pulse: 85 79  Resp: (!) 25 18  Temp:    SpO2: 96% 95%    Constitutional: Alert and oriented.  Eyes: Conjunctivae are normal.  Head: Atraumatic. Nose: No congestion/rhinnorhea. Mouth/Throat: Mucous membranes are moist.   Neck: No stridor. Painless ROM.  Cardiovascular: Normal rate, regular rhythm. Grossly normal heart sounds.  Good peripheral circulation. Respiratory: mild tachypnea,  No retractions. Lungs with coarse expiratory wheeze, diminished bibasilar  bs. Gastrointestinal: Soft and nontender. No distention. No abdominal bruits. No CVA tenderness. Genitourinary:  Musculoskeletal: No lower extremity tenderness, trace edema.  No joint effusions. Neurologic:  Normal speech and language. No gross focal neurologic deficits are appreciated. No facial droop Skin:  Skin is warm, dry and intact. No rash noted. Psychiatric: Mood and affect are normal. Speech and behavior are normal.  ____________________________________________   LABS (all labs ordered are listed, but only abnormal results are displayed)  Results for orders placed or performed during the hospital encounter of 04/05/21 (from the past 24 hour(s))  CBC     Status: Abnormal   Collection Time: 04/05/21  8:25 AM  Result Value Ref Range   WBC 14.6 (H) 4.0 - 10.5 K/uL   RBC 5.38 (H) 3.87 - 5.11 MIL/uL   Hemoglobin 15.2 (H) 12.0 - 15.0 g/dL   HCT 45.9 36.0 - 46.0 %   MCV 85.3 80.0 - 100.0 fL   MCH 28.3 26.0 - 34.0 pg   MCHC 33.1 30.0 - 36.0 g/dL   RDW 13.7 11.5 - 15.5 %  Platelets 323 150 - 400 K/uL   nRBC 0.0 0.0 - 0.2 %  Comprehensive metabolic panel     Status: Abnormal   Collection Time: 04/05/21  8:25 AM  Result Value Ref Range   Sodium 137 135 - 145 mmol/L   Potassium 4.4 3.5 - 5.1 mmol/L   Chloride 99 98 - 111 mmol/L   CO2 28 22 - 32 mmol/L   Glucose, Bld 251 (H) 70 - 99 mg/dL   BUN 12 8 - 23 mg/dL   Creatinine, Ser 0.60 0.44 - 1.00 mg/dL   Calcium 9.2 8.9 - 10.3 mg/dL   Total Protein 7.4 6.5 - 8.1 g/dL   Albumin 4.1 3.5 - 5.0 g/dL   AST 33 15 - 41 U/L   ALT 41 0 - 44 U/L   Alkaline Phosphatase 77 38 - 126 U/L   Total Bilirubin 1.0 0.3 - 1.2 mg/dL   GFR, Estimated >60 >60 mL/min   Anion gap 10 5 - 15  Troponin I (High Sensitivity)     Status: Abnormal   Collection Time: 04/05/21  8:25 AM  Result Value Ref Range   Troponin I (High Sensitivity) 26 (H) <18 ng/L  Brain natriuretic peptide     Status: None   Collection Time: 04/05/21  8:25 AM  Result Value Ref  Range   B Natriuretic Peptide 37.9 0.0 - 100.0 pg/mL   ____________________________________________  EKG My review and personal interpretation at Time: 8:24   Indication: sob  Rate: 80  Rhythm: sinus Axis: normal Other: anterolat twi and nonspecific st abn c/w previous tracing ____________________________________________  RADIOLOGY  I personally reviewed all radiographic images ordered to evaluate for the above acute complaints and reviewed radiology reports and findings.  These findings were personally discussed with the patient.  Please see medical record for radiology report.  ____________________________________________   PROCEDURES  Procedure(s) performed:  Procedures    Critical Care performed: no ____________________________________________   INITIAL IMPRESSION / ASSESSMENT AND PLAN / ED COURSE  Pertinent labs & imaging results that were available during my care of the patient were reviewed by me and considered in my medical decision making (see chart for details).   DDX: acs, Asthma, copd, CHF, pna, ptx, malignancy, Pe, anemia   Elaine Wright is a 65 y.o. who presents to the ED with presentation as described above.  Patient complex recent past medical history presenting with worsening shortness of breath.  Very broad differential which does have wheezing on exam may be cardiac wheeze no history of COPD or bronchitis.  Have lower suspicion for PE.  Blood work sent for the above differential.  EKG is consistent with previous.  Clinical Course as of 04/05/21 0935  Thu Apr 05, 2021  0909 Patient's chest x-ray without any significant edema.  Given her wheezing will trial nebulizer.  Her troponin is significantly improved as compared to previous.  White count is downtrending. [PR]  [id number] Patient with improvement after nebulizer.  Will give additional nebs as well as steroids.  Given her work of breathing on arrival recent cardiac history no established history of  obstructive lung disease do feel that observation the hospital will be appropriate.  Patient agreeable plan. [PR]    Clinical Course User Index [PR] Merlyn Lot, MD    The patient was evaluated in Emergency Department today for the symptoms described in the history of present illness. He/she was evaluated in the context of the global COVID-19 pandemic, which necessitated consideration that the patient might  be at risk for infection with the SARS-CoV-2 virus that causes COVID-19. Institutional protocols and algorithms that pertain to the evaluation of patients at risk for COVID-19 are in a state of rapid change based on information released by regulatory bodies including the CDC and federal and state organizations. These policies and algorithms were followed during the patient's care in the ED.  As part of my medical decision making, I reviewed the following data within the [facility] notes reviewed and incorporated, Labs reviewed, notes from prior ED visits and [place] Controlled Substance Database   ____________________________________________   FINAL CLINICAL IMPRESSION(S) / ED DIAGNOSES  Final diagnoses:  Dyspnea, unspecified type      NEW MEDICATIONS STARTED DURING THIS VISIT:  New Prescriptions   No medications on file     Note:  This document was prepared using Dragon voice recognition software and may include unintentional dictation errors.    Merlyn Lot, MD 04/05/21 [phone]

## 2021-04-05 NOTE — Evaluation (Signed)
Occupational Therapy Evaluation Patient Details Name: Elaine Wright MRN: [id number] DOB: [birth year] Today's Date: 04/05/2021    History of Present Illness Elaine Wright is a 65 y.o. female with medical history significant of CAD, STEMI (s/p of PCI/DES), HLD, DM, former smoker, OSA on CPAP, GERD, present with SOB. She was discharged from hospital last week. She has COPD and quit smoking last month after 40years.   Clinical Impression   [name] was seen for OT evaluation this date. PTA pt was independent in all ADL and functional mobility, living in a 1 story home with 2 STE. Pt lives with husband and requires no AD for community mobility. Pt currently demonstrating near baseline functional mobility and ADLs. Pt completed toilet t/f and ~100 ft in room mobility with SpO2 94% on 2L [place], titrated down to RA maintained SpO2 91%. Pt educated in energy conservation strategies including pursed lip breathing, activity pacing, home/routines modifications, work simplification, AE/DME, prioritizing of meaningful occupations, and falls prevention.  Pt verbalized understanding, no skilled acute OT needs, will sign off. Upon discharge, recommend cardiopulmonary rehab services.       Follow Up Recommendations  Other (comment) (cardiopulmonary rehab)    Equipment Recommendations  None recommended by OT    Recommendations for Other Services       Precautions / Restrictions Precautions Precautions: None Restrictions Weight Bearing Restrictions: No      Mobility Bed Mobility Overal bed mobility: Independent                  Transfers Overall transfer level: Independent                    Balance Overall balance assessment: Mild deficits observed, not formally tested                                         ADL either performed or assessed with clinical judgement   ADL Overall ADL's : Independent                                        General ADL Comments: no assist for LB access, toileting, adn ADL t/fs                  Pertinent Vitals/Pain Pain Assessment: No/denies pain     Hand Dominance Right   Extremity/Trunk Assessment Upper Extremity Assessment Upper Extremity Assessment: Overall WFL for tasks assessed   Lower Extremity Assessment Lower Extremity Assessment: Overall WFL for tasks assessed       Communication Communication Communication: No difficulties   Cognition Arousal/Alertness: Awake/alert Behavior During Therapy: WFL for tasks assessed/performed Overall Cognitive Status: Within Functional Limits for tasks assessed                                     General Comments  SpO2 94% on 2L [place] with mobility, 91% on RA    Exercises Exercises: Other exercises Other Exercises Other Exercises: Pt educated re: OT role, DME recs, d/c recs, falls prevention, ECS Other Exercises: LBD, toileting, sup<>sit, sit<>stand, sitting/standing balance/tolerance   Shoulder Instructions      Home Living Family/patient expects to be discharged to:: Private residence Living Arrangements: Spouse/significant other Available Help  at Discharge: Family;Available PRN/intermittently Type of Home: House Home Access: Stairs to enter [employer] of Steps: 2 Entrance Stairs-Rails: None Home Layout: One level               Home Equipment: Walker - 2 wheels   Additional Comments: equipment in storage from prior knee replacement      Prior Functioning/Environment Level of Independence: Independent        Comments: No AD for community mobility, no O2 baseline        OT Problem List: Cardiopulmonary status limiting activity;Decreased activity tolerance         OT Goals(Current goals can be found in the care plan section) Acute Rehab OT Goals Patient Stated Goal: to go home OT Goal Formulation: With patient Time For Goal Achievement: 04/19/21 Potential to Achieve Goals:  Good   AM-PAC OT "6 Clicks" Daily Activity     Outcome Measure Help from another person eating meals?: None Help from another person taking care of personal grooming?: None Help from another person toileting, which includes using toliet, bedpan, or urinal?: None Help from another person bathing (including washing, rinsing, drying)?: A Little Help from another person to put on and taking off regular upper body clothing?: None Help from another person to put on and taking off regular lower body clothing?: None 6 Click Score: 23   End of Session    Activity Tolerance: Patient tolerated treatment well Patient left: in bed;with call bell/phone within reach  OT Visit Diagnosis: Other abnormalities of gait and mobility (R26.89)                Time: 0315-9458 OT Time Calculation (min): 15 min Charges:  OT General Charges $OT Visit: 1 Visit OT Evaluation $OT Eval Low Complexity: 1 Low OT Treatments $Self Care/Home Management : 8-22 mins  Dessie Coma, M.S. OTR/L  04/05/21, 3:12 PM  ascom [phone]

## 2021-04-05 NOTE — Progress Notes (Signed)
Pt arrived to floor at 1535. Alert and oriented x 4. No complaints of pain. Oriented to room and call light. All current need met at this time.

## 2021-04-05 NOTE — ED Triage Notes (Signed)
Pt comes with c/o increased SOB. Pt states she was recently for SOB and heart attack couple weeks ago. Pt states she was better when discharged  Pt states 2 nights ago it got worse again and she had had trouble sleeping. Pt states she has had to sleep sitting up.

## 2021-04-05 NOTE — H&P (Signed)
History and Physical    Elaine Wright HQI:[id number] DOB: 03/25/1956 DOA: 04/05/2021  Referring MD/NP/PA:   PCP: [name], MD   Patient coming from:  The patient is coming from home.  At baseline, pt is independent for most of ADL.        Chief Complaint: SOB  HPI: Elaine Wright is a 65 y.o. female with medical history significant of CAD, STEMI (s/p of PCI/DES), HLD, DM, former smoker, OSA on CPAP, GERD, present with SOB.  Patient states that she has shortness of breath for more than 2 days, which has been progressively worsening.  Patient has cough with some mucus production, denies fever or chills.  No chest pain.  Patient has nausea, no vomiting, diarrhea or abdominal.  No symptoms of UTI.  No unilateral weakness.  ED Course: pt was found to have WBC 14.6, troponin level 26, 23, 22, 19, negative COVID PCR, electrolytes renal function okay, temperature normal, blood pressure 112/84, heart rate 79, RR 25, oxygen saturation 86-94% on room air.  Chest x-ray negative.  Patient is placed on progressive bed for observation.  Dr. Lanney Gins of pulmonology is consulted  Review of Systems:   General: no fevers, chills, no body weight gain, has fatigue HEENT: no blurry vision, hearing changes or sore throat Respiratory: has dyspnea, coughing, wheezing CV: no chest pain, no palpitations GI: has nausea, no vomiting, abdominal pain, diarrhea, constipation GU: no dysuria, burning on urination, increased urinary frequency, hematuria  Ext: no leg edema Neuro: no unilateral weakness, numbness, or tingling, no vision change or hearing loss Skin: no rash, no skin tear. MSK: No muscle spasm, no deformity, no limitation of range of movement in spin Heme: No easy bruising.  Travel history: No recent long distant travel.  Allergy:  Allergies  Allergen Reactions  . Morphine And Related Other (See Comments)    Confusion, hallucinations    Past Medical History:  Diagnosis Date  .  Arthritis   . Diabetes mellitus without complication ([place])   . GERD (gastroesophageal reflux disease)   . Sleep apnea     Past Surgical History:  Procedure Laterality Date  . ABDOMINAL HYSTERECTOMY  2003  . ANTERIOR AND POSTERIOR REPAIR N/A 04/14/2020   Procedure: ANTERIOR (CYSTOCELE) AND POSTERIOR REPAIR (RECTOCELE);  Surgeon: Schermerhorn, Gwen Her, MD;  Location: [facility];  Service: Gynecology;  Laterality: N/A;  . CORONARY/GRAFT ACUTE MI REVASCULARIZATION N/A 03/24/2021   Procedure: Coronary/Graft Acute MI Revascularization;  Surgeon: Burnell Blanks, MD;  Location: [place] [facility];  Service: Cardiovascular;  Laterality: N/A;  . EYE SURGERY  2011   Cataract bil  . LEFT HEART CATH AND CORONARY ANGIOGRAPHY N/A 03/24/2021   Procedure: LEFT HEART CATH AND CORONARY ANGIOGRAPHY;  Surgeon: Burnell Blanks, MD;  Location: [place] [facility];  Service: Cardiovascular;  Laterality: N/A;  . PARTIAL KNEE ARTHROPLASTY  2009   right   . Thumb joint     Tendon from arm to replace arthrititic thumb  . TOTAL KNEE ARTHROPLASTY  12/23/2012   Procedure: TOTAL KNEE ARTHROPLASTY;  Surgeon: Ninetta Lights, MD;  Location: [place];  Service: Orthopedics;  Laterality: Left;  . TRACHELECTOMY N/A 04/14/2020   Procedure: LAPAROSCOPIC TRACHELECTOMY;  Surgeon: Schermerhorn, Gwen Her, MD;  Location: [facility];  Service: Gynecology;  Laterality: N/A;    Social History:  reports that she quit smoking about 3 weeks ago. Her smoking use included cigarettes. She has a 22.50 pack-year smoking history. She has never used smokeless  tobacco. She reports current alcohol use. She reports that she does not use drugs.  Family History:  Family History  Problem Relation Age of Onset  . Breast cancer Neg Hx      Prior to Admission medications   Medication Sig Start Date End Date Taking? Authorizing Provider  albuterol (VENTOLIN HFA) 108 (90 Base) MCG/ACT inhaler Inhale 2 puffs into the lungs every 6 (six)  hours as needed for wheezing or shortness of breath. 03/21/21   Blake Divine, MD  alendronate (FOSAMAX) 70 MG tablet Take 70 mg by mouth once a week. 02/07/21   [provider]  aspirin 81 MG chewable tablet Chew 1 tablet (81 mg total) by mouth daily. 03/28/21   Sidney Ace, MD  atorvastatin (LIPITOR) 80 MG tablet Take 1 tablet (80 mg total) by mouth daily. 04/03/21 09/30/21  Loel Dubonnet, NP  benzonatate (TESSALON) 100 MG capsule Take by mouth. 04/02/21 04/12/21  [provider]  ipratropium-albuterol (DUONEB) 0.5-2.5 (3) MG/3ML SOLN USE 1 VIAL VIA NEBULIZER FOUR TIMES DAILY AS DIRECTED 03/22/21   [provider]  metFORMIN (GLUCOPHAGE-XR) 500 MG 24 hr tablet Take 1,000 mg by mouth daily. afternoon 03/04/20   [provider]  metoprolol succinate (TOPROL-XL) 25 MG 24 hr tablet Take 1 tablet (25 mg total) by mouth daily. 04/03/21 09/30/21  Loel Dubonnet, NP  metroNIDAZOLE (METROCREAM) 0.75 % cream Apply topically at bedtime. qhs to face for Rosacea 02/05/21 02/05/22  Brendolyn Patty, MD  Multiple Vitamin (MULTI-VITAMIN) tablet Take 1 tablet by mouth daily.    [provider]  nitroGLYCERIN (NITROSTAT) 0.4 MG SL tablet Place 1 tablet (0.4 mg total) under the tongue every 5 (five) minutes as needed for chest pain. 04/03/21 07/02/21  Loel Dubonnet, NP  omeprazole (PRILOSEC) 20 MG capsule Take 1 capsule by mouth daily. 03/21/21   [provider]  ticagrelor (BRILINTA) 90 MG TABS tablet Take 1 tablet (90 mg total) by mouth 2 (two) times daily. 04/03/21 03/29/22  Loel Dubonnet, NP    Physical Exam: Vitals:   04/05/21 1000 04/05/21 1130 04/05/21 1550 04/05/21 1552  BP: 134/88 123/88  132/85  Pulse: 77 88  84  Resp: (!) 23 (!) 24  17  Temp:    97.8 F (36.6 C)  SpO2: 96% 92% 96% 96%  Weight:      Height:       General: Not in acute distress HEENT:       Eyes: PERRL, EOMI, no scleral icterus.       ENT: No discharge from the ears and  nose, no pharynx injection, no tonsillar enlargement.        Neck: No JVD, no bruit, no mass felt. Heme: No neck lymph node enlargement. Cardiac: S1/S2, RRR, No murmurs, No gallops or rubs. Respiratory: Has wheezing bilaterally GI: Soft, nondistended, nontender, no rebound pain, no organomegaly, BS present. GU: No hematuria Ext: No pitting leg edema bilaterally. 1+DP/PT pulse bilaterally. Musculoskeletal: No joint deformities, No joint redness or warmth, no limitation of ROM in spin. Skin: No rashes.  Neuro: Alert, oriented X3, cranial nerves II-XII grossly intact, moves all extremities normally. Psych: Patient is not psychotic, no suicidal or hemocidal ideation.  Labs on Admission: I have personally reviewed following labs and imaging studies  CBC: Recent Labs  Lab 04/03/21 0953 04/05/21 0825  WBC 17.2* 14.6*  HGB 15.4 15.2*  HCT 45.7 45.9  MCV 85 85.3  PLT 344 263   Basic Metabolic Panel: Recent  Labs  Lab 04/03/21 0953 04/05/21 0825  NA 140 137  K 4.1 4.4  CL 97 99  CO2 26 28  GLUCOSE 215* 251*  BUN 15 12  CREATININE 0.90 0.60  CALCIUM 9.3 9.2   GFR: Estimated Creatinine Clearance: 59.4 mL/min (by C-G formula based on SCr of 0.6 mg/dL). Liver Function Tests: Recent Labs  Lab 04/05/21 0825  AST 33  ALT 41  ALKPHOS 77  BILITOT 1.0  PROT 7.4  ALBUMIN 4.1   No results for input(s): LIPASE, AMYLASE in the last 168 hours. No results for input(s): AMMONIA in the last 168 hours. Coagulation Profile: No results for input(s): INR, PROTIME in the last 168 hours. Cardiac Enzymes: No results for input(s): CKTOTAL, CKMB, CKMBINDEX, TROPONINI in the last 168 hours. BNP (last 3 results) No results for input(s): PROBNP in the last 8760 hours. HbA1C: No results for input(s): HGBA1C in the last 72 hours. CBG: Recent Labs  Lab 04/05/21 1132  GLUCAP 194*   Lipid Profile: No results for input(s): CHOL, HDL, LDLCALC, TRIG, CHOLHDL, LDLDIRECT in the last 72  hours. Thyroid Function Tests: No results for input(s): TSH, T4TOTAL, FREET4, T3FREE, THYROIDAB in the last 72 hours. Anemia Panel: No results for input(s): VITAMINB12, FOLATE, FERRITIN, TIBC, IRON, RETICCTPCT in the last 72 hours. Urine analysis:    Component Value Date/Time   COLORURINE YELLOW 12/17/2012 [place] 12/17/2012 1211   LABSPEC 1.009 12/17/2012 1211   PHURINE 6.5 12/17/2012 1211   GLUCOSEU NEGATIVE 12/17/2012 1211   HGBUR NEGATIVE 12/17/2012 1211   BILIRUBINUR NEGATIVE 12/17/2012 1211   KETONESUR NEGATIVE 12/17/2012 1211   PROTEINUR NEGATIVE 12/17/2012 1211   UROBILINOGEN 0.2 12/17/2012 1211   NITRITE NEGATIVE 12/17/2012 1211   LEUKOCYTESUR NEGATIVE 12/17/2012 1211   Sepsis Labs: @LABRCNTIP (procalcitonin:4,lacticidven:4) ) Recent Results (from the past 240 hour(s))  Resp Panel by RT-PCR (Flu A&B, Covid) Nasopharyngeal Swab     Status: None   Collection Time: 04/05/21  8:25 AM   Specimen: Nasopharyngeal Swab; Nasopharyngeal(NP) swabs in vial transport medium  Result Value Ref Range Status   SARS Coronavirus 2 by RT PCR NEGATIVE NEGATIVE Final    Comment: (NOTE) SARS-CoV-2 target nucleic acids are NOT DETECTED.  The SARS-CoV-2 RNA is generally detectable in upper respiratory specimens during the acute phase of infection. The lowest concentration of SARS-CoV-2 viral copies this assay can detect is 138 copies/mL. A negative result does not preclude SARS-Cov-2 infection and should not be used as the sole basis for treatment or other patient management decisions. A negative result may occur with  improper specimen collection/handling, submission of specimen other than nasopharyngeal swab, presence of viral mutation(s) within the areas targeted by this assay, and inadequate number of viral copies(<138 copies/mL). A negative result must be combined with clinical observations, patient history, and epidemiological information. The expected result is  Negative.  Fact Sheet for Patients:  EntrepreneurPulse.com.au  Fact Sheet for Healthcare Providers:  IncredibleEmployment.be  This test is no t yet approved or cleared by the [place] FDA and  has been authorized for detection and/or diagnosis of SARS-CoV-2 by FDA under an Emergency Use Authorization (EUA). This EUA will remain  in effect (meaning this test can be used) for the duration of the COVID-19 declaration under Section 564(b)(1) of the Act, 21 U.S.C.section 360bbb-3(b)(1), unless the authorization is terminated  or revoked sooner.       Influenza A by PCR NEGATIVE NEGATIVE Final   Influenza B by PCR NEGATIVE NEGATIVE Final  Comment: (NOTE) The Xpert Xpress SARS-CoV-2/FLU/RSV plus assay is intended as an aid in the diagnosis of influenza from Nasopharyngeal swab specimens and should not be used as a sole basis for treatment. Nasal washings and aspirates are unacceptable for Xpert Xpress SARS-CoV-2/FLU/RSV testing.  Fact Sheet for Patients: EntrepreneurPulse.com.au  Fact Sheet for Healthcare Providers: IncredibleEmployment.be  This test is not yet approved or cleared by the [place] FDA and has been authorized for detection and/or diagnosis of SARS-CoV-2 by FDA under an Emergency Use Authorization (EUA). This EUA will remain in effect (meaning this test can be used) for the duration of the COVID-19 declaration under Section 564(b)(1) of the Act, 21 U.S.C. section 360bbb-3(b)(1), unless the authorization is terminated or revoked.  Performed at [facility], [address] [place],  [address]      Radiological Exams on Admission: DG Chest Portable 1 View  Result Date: 04/05/2021 CLINICAL DATA:  Shortness of breath. EXAM: PORTABLE CHEST 1 VIEW COMPARISON:  03/24/2021 FINDINGS: Normal sized heart. Clear lungs. Thoracic spine degenerative changes and mild scoliosis.  IMPRESSION: No acute abnormality. Electronically Signed   By: Claudie Revering M.D.   On: 04/05/2021 08:57     EKG: I have personally reviewed.  Sinus rhythm, QTC 476, T wave inversion in lateral leads and V2-V6  Assessment/Plan Principal Problem:   Acute respiratory failure with hypoxia (HCC) Active Problems:   CAD (coronary artery disease)   Diabetes mellitus without complication (HCC)   Sleep apnea   HLD (hyperlipidemia)   Elevated troponin   GERD (gastroesophageal reflux disease)   COPD exacerbation (HCC)   Acute respiratory failure with hypoxia due to possible undiagnosed COPD with acute exacerbation: Patient is former smoker, has wheezing on auscultation, indicating possible COPD.  Chest x-ray negative. Pulmonology, Dr. Lanney Gins is consulted - will place to progressive unit for observation -Bronchodilators -Solu-Medrol 40 mg IV bid -Z pak  -Mucinex for cough  -Incentive spirometry -Follow up blood culture x2, sputum culture -Nasal cannula oxygen as needed to maintain O2 saturation 93% or greater -will get D-dimer per Dr. Teodoro Kil recommendation.  If positive, will get CT angiogram to rule out PE  CAD (coronary artery disease) and elevated troponin: Patient was recently hospitalized from 4/9-4/12 due to STEMI.  Patient is s/p of PCI/DES. Her trop is minimally elevated, 22 -->23 -->22 -->19. No CP.  Likely due to demand ischemia -Continue aspirin, Brilinta, Lipitor -As needed nitroglycerin  Diabetes mellitus without complication [facility]): Recent A1c 6.9, well controlled.  Patient taking metformin -Sliding scale insulin  Sleep apnea -CPAP  HLD (hyperlipidemia) -Lipitor  GERD (gastroesophageal reflux disease) -Protonix         DVT ppx: SQ Lovenox Code Status: Full code Family Communication:   Yes, patient's husband at bed side Disposition Plan:  Anticipate discharge back to previous environment Consults called: Pulmonology, Dr. Lanney Gins Admission status and  Level of care: Progressive Cardiac:    for obs  Status is: Observation  The patient remains OBS appropriate and will d/c before 2 midnights.  Dispo: The patient is from: Home              Anticipated d/c is to: Home              Patient currently is not medically stable to d/c.   Difficult to place patient No          Date of Service 04/05/2021    [facility]   If 7PM-7AM, please contact night-coverage [email or web address] 04/05/2021,  5:30 PM

## 2021-04-05 NOTE — ED Notes (Signed)
Transport to [employer] floor requested per ED Network engineer.

## 2021-04-06 ENCOUNTER — Observation Stay: Payer: Medicare Other

## 2021-04-06 DIAGNOSIS — R0602 Shortness of breath: Secondary | ICD-10-CM | POA: Diagnosis present

## 2021-04-06 DIAGNOSIS — J9601 Acute respiratory failure with hypoxia: Secondary | ICD-10-CM

## 2021-04-06 DIAGNOSIS — Z955 Presence of coronary angioplasty implant and graft: Secondary | ICD-10-CM | POA: Diagnosis not present

## 2021-04-06 DIAGNOSIS — T380X5A Adverse effect of glucocorticoids and synthetic analogues, initial encounter: Secondary | ICD-10-CM | POA: Diagnosis present

## 2021-04-06 DIAGNOSIS — K224 Dyskinesia of esophagus: Secondary | ICD-10-CM | POA: Diagnosis present

## 2021-04-06 DIAGNOSIS — Z7984 Long term (current) use of oral hypoglycemic drugs: Secondary | ICD-10-CM | POA: Diagnosis not present

## 2021-04-06 DIAGNOSIS — I2510 Atherosclerotic heart disease of native coronary artery without angina pectoris: Secondary | ICD-10-CM | POA: Diagnosis present

## 2021-04-06 DIAGNOSIS — K219 Gastro-esophageal reflux disease without esophagitis: Secondary | ICD-10-CM | POA: Diagnosis present

## 2021-04-06 DIAGNOSIS — I252 Old myocardial infarction: Secondary | ICD-10-CM | POA: Diagnosis not present

## 2021-04-06 DIAGNOSIS — Z79899 Other long term (current) drug therapy: Secondary | ICD-10-CM | POA: Diagnosis not present

## 2021-04-06 DIAGNOSIS — Z20822 Contact with and (suspected) exposure to covid-19: Secondary | ICD-10-CM | POA: Diagnosis present

## 2021-04-06 DIAGNOSIS — E119 Type 2 diabetes mellitus without complications: Secondary | ICD-10-CM

## 2021-04-06 DIAGNOSIS — G4733 Obstructive sleep apnea (adult) (pediatric): Secondary | ICD-10-CM | POA: Diagnosis present

## 2021-04-06 DIAGNOSIS — I248 Other forms of acute ischemic heart disease: Secondary | ICD-10-CM | POA: Diagnosis present

## 2021-04-06 DIAGNOSIS — Z87891 Personal history of nicotine dependence: Secondary | ICD-10-CM | POA: Diagnosis not present

## 2021-04-06 DIAGNOSIS — J441 Chronic obstructive pulmonary disease with (acute) exacerbation: Secondary | ICD-10-CM | POA: Diagnosis present

## 2021-04-06 DIAGNOSIS — E1165 Type 2 diabetes mellitus with hyperglycemia: Secondary | ICD-10-CM | POA: Diagnosis present

## 2021-04-06 DIAGNOSIS — Z7982 Long term (current) use of aspirin: Secondary | ICD-10-CM | POA: Diagnosis not present

## 2021-04-06 DIAGNOSIS — E785 Hyperlipidemia, unspecified: Secondary | ICD-10-CM | POA: Diagnosis present

## 2021-04-06 DIAGNOSIS — Z96651 Presence of right artificial knee joint: Secondary | ICD-10-CM | POA: Diagnosis present

## 2021-04-06 DIAGNOSIS — Z885 Allergy status to narcotic agent status: Secondary | ICD-10-CM | POA: Diagnosis not present

## 2021-04-06 DIAGNOSIS — Z9842 Cataract extraction status, left eye: Secondary | ICD-10-CM | POA: Diagnosis not present

## 2021-04-06 DIAGNOSIS — Z9841 Cataract extraction status, right eye: Secondary | ICD-10-CM | POA: Diagnosis not present

## 2021-04-06 DIAGNOSIS — R778 Other specified abnormalities of plasma proteins: Secondary | ICD-10-CM | POA: Diagnosis not present

## 2021-04-06 LAB — BASIC METABOLIC PANEL
Anion gap: 8 (ref 5–15)
BUN: 15 mg/dL (ref 8–23)
CO2: 27 mmol/L (ref 22–32)
Calcium: 8.7 mg/dL — ABNORMAL LOW (ref 8.9–10.3)
Chloride: 101 mmol/L (ref 98–111)
Creatinine, Ser: 0.82 mg/dL (ref 0.44–1.00)
GFR, Estimated: >60 mL/min (ref >60)
Glucose, Bld: 263 mg/dL — ABNORMAL HIGH (ref 70–99)
Potassium: 4.7 mmol/L (ref 3.5–5.1)
Sodium: 136 mmol/L (ref 135–145)

## 2021-04-06 LAB — CBC
HCT: 39.2 % (ref 36.0–46.0)
Hemoglobin: 12.9 g/dL (ref 12.0–15.0)
MCH: 27.9 pg (ref 26.0–34.0)
MCHC: 32.9 g/dL (ref 30.0–36.0)
MCV: 84.8 fL (ref 80.0–100.0)
Platelets: 300 10*3/uL (ref 150–400)
RBC: 4.62 MIL/uL (ref 3.87–5.11)
RDW: 14 % (ref 11.5–15.5)
WBC: 22.6 10*3/uL — ABNORMAL HIGH (ref 4.0–10.5)
nRBC: 0 % (ref 0.0–0.2)

## 2021-04-06 LAB — GLUCOSE, CAPILLARY
Glucose-Capillary: 241 mg/dL — ABNORMAL HIGH (ref 70–99)
Glucose-Capillary: 232 mg/dL — ABNORMAL HIGH (ref 70–99)
Glucose-Capillary: 301 mg/dL — ABNORMAL HIGH (ref 70–99)
Glucose-Capillary: 183 mg/dL — ABNORMAL HIGH (ref 70–99)

## 2021-04-06 MED ORDER — ALUM & MAG HYDROXIDE-SIMETH 200-200-20 MG/5ML PO SUSP
15.0000 mL | ORAL | Status: DC | PRN
  Administered 2021-04-06: 15 mL via ORAL
  Filled 2021-04-06: qty 30

## 2021-04-06 MED ORDER — CARBOXYMETHYLCELLULOSE SODIUM 0.25 % OP SOLN: Freq: Every day | OPHTHALMIC | Status: DC | PRN

## 2021-04-06 MED ORDER — ADULT MULTIVITAMIN W/MINERALS CH
1.0000 | ORAL_TABLET | Freq: Every day | ORAL | Status: DC
  Administered 2021-04-06 – 2021-04-07 (×2): 1 via ORAL
  Filled 2021-04-06 (×2): qty 1

## 2021-04-06 MED ORDER — NITROGLYCERIN 0.4 MG SL SUBL: 0.4000 mg | SUBLINGUAL_TABLET | SUBLINGUAL | Status: DC | PRN

## 2021-04-06 MED ORDER — PANTOPRAZOLE SODIUM 40 MG PO TBEC
40.0000 mg | DELAYED_RELEASE_TABLET | Freq: Every day | ORAL | Status: DC
  Administered 2021-04-06 – 2021-04-07 (×2): 40 mg via ORAL
  Filled 2021-04-06 (×2): qty 1

## 2021-04-06 MED ORDER — TICAGRELOR 90 MG PO TABS
90.0000 mg | ORAL_TABLET | Freq: Two times a day (BID) | ORAL | Status: DC
  Administered 2021-04-06 – 2021-04-07 (×3): 90 mg via ORAL
  Filled 2021-04-06 (×3): qty 1

## 2021-04-06 MED ORDER — ATORVASTATIN CALCIUM 80 MG PO TABS
80.0000 mg | ORAL_TABLET | Freq: Every day | ORAL | Status: DC
  Administered 2021-04-06 – 2021-04-07 (×2): 80 mg via ORAL
  Filled 2021-04-06 (×2): qty 1

## 2021-04-06 MED ORDER — METOPROLOL SUCCINATE ER 25 MG PO TB24
25.0000 mg | ORAL_TABLET | Freq: Every day | ORAL | Status: DC
  Administered 2021-04-06 – 2021-04-07 (×2): 25 mg via ORAL
  Filled 2021-04-06 (×2): qty 1

## 2021-04-06 MED ORDER — IPRATROPIUM-ALBUTEROL 0.5-2.5 (3) MG/3ML IN SOLN
3.0000 mL | Freq: Three times a day (TID) | RESPIRATORY_TRACT | Status: DC
  Administered 2021-04-07: 3 mL via RESPIRATORY_TRACT
  Filled 2021-04-06: qty 3

## 2021-04-06 MED ORDER — METRONIDAZOLE 0.75 % EX GEL
1.0000 "application " | Freq: Every evening | CUTANEOUS | Status: DC | PRN
  Filled 2021-04-06: qty 45

## 2021-04-06 MED ORDER — PREDNISONE 20 MG PO TABS
40.0000 mg | ORAL_TABLET | Freq: Every day | ORAL | Status: DC
  Administered 2021-04-07: 40 mg via ORAL
  Filled 2021-04-06: qty 2

## 2021-04-06 MED ORDER — ADULT MULTIVITAMIN W/MINERALS CH
1.0000 | ORAL_TABLET | Freq: Every day | ORAL | Status: DC
  Filled 2021-04-06: qty 1

## 2021-04-06 MED ORDER — ASPIRIN 81 MG PO CHEW
81.0000 mg | CHEWABLE_TABLET | Freq: Every day | ORAL | Status: DC
  Administered 2021-04-06 – 2021-04-07 (×2): 81 mg via ORAL
  Filled 2021-04-06 (×2): qty 1

## 2021-04-06 NOTE — Progress Notes (Signed)
Discontinue Carboxymethcellulose Solution (contact solution). This item is not stocked.

## 2021-04-06 NOTE — Progress Notes (Signed)
PROGRESS NOTE    Elaine Wright  NFA:[id number] DOB: August 29, 1956 DOA: 04/05/2021 PCP: [name], MD   Chief complaint.  Shortness of breath Brief Narrative:  Elaine Wright is a 65 y.o. female with medical history significant of CAD, STEMI (s/p of PCI/DES), HLD, DM, former smoker, OSA on CPAP, GERD, present with SOB. Has significant acid reflex, she had a sudden onset of short of breath at midnight after an episode of acid reflux.  She started have significant wheezing.  She came to the hospital with a found to have hypoxemia, she was diagnosed with a COPD exacerbation and placed on steroids and Zithromax.   Assessment & Plan:   Principal Problem:   Acute respiratory failure with hypoxia (HCC) Active Problems:   CAD (coronary artery disease)   Diabetes mellitus without complication (HCC)   Sleep apnea   HLD (hyperlipidemia)   Elevated troponin   GERD (gastroesophageal reflux disease)   COPD exacerbation ([place])  #1.  Acute hypoxemic respiratory failure. COPD exacerbation. Gastroesophageal reflux disease with aspiration. Patient condition was triggered by aspiration from acid reflux.  She also has symptoms of dysphagia, possibility of esophageal stricture. Patient has been seen by speech therapy, no pharyngeal phase of dysphagia.  Suspect esophageal phase. Barium esophagram is obtained. Will be treated with oral steroids, continue oxygen treatment, wean oxygen. I will keep patient 1 more day, discharge tomorrow if condition improves. Protonix started  2.  Coronary disease with mild elevation troponin. Mild elevation troponin could be secondary to aspiration.  3.  Type 2 diabetes. Relative controlled with A1c 6.9.  Continue sliding scale insulin. Elevated glucose secondary to steroids.  Steroid changed to oral now.    DVT prophylaxis: Lovenox Code Status: Full Family Communication:  Disposition Plan:  .   Status is: Inpatient  Remains inpatient appropriate  because:Inpatient level of care appropriate due to severity of illness   Dispo: The patient is from: Home              Anticipated d/c is to: Home              Patient currently is not medically stable to d/c.   Difficult to place patient No        No intake/output data recorded. No intake/output data recorded.     Consultants:   Pulm  Procedures: None  Antimicrobials:  Zithromax.  Subjective: Feel much better since arriving the hospital.  Still has some short of breath with exertion, still on 2 L oxygen. She has a cough, largely nonproductive. No fever or chills. No chest pain or palpitation. No dysuria hematuria pain No headache or dizziness   Objective: Vitals:   04/05/21 2027 04/06/21 0158 04/06/21 0428 04/06/21 0810  BP: 109/79  105/71 (!) 134/94  Pulse: 92  81 86  Resp: (!) 22  17   Temp: 98.3 F (36.8 C)  98 F (36.7 C) 98.1 F (36.7 C)  TempSrc: Oral   Oral  SpO2: 95% 95% 98% 97%  Weight:      Height:       No intake or output data in the 24 hours ending 04/06/21 1422 Filed Weights   04/05/21 0820  Weight: 62.6 kg    Examination:  General exam: Appears calm and comfortable  Respiratory system: Decreased breathing sounds. Respiratory effort normal. Cardiovascular system: S1 & S2 heard, RRR. No JVD, murmurs, rubs, gallops or clicks. No pedal edema. Gastrointestinal system: Abdomen is nondistended, soft and nontender. No organomegaly or masses  felt. Normal bowel sounds heard. Central nervous system: Alert and oriented. No focal neurological deficits. Extremities: Symmetric 5 x 5 power. Skin: No rashes, lesions or ulcers Psychiatry: Mood & affect appropriate.     Data Reviewed: I have personally reviewed following labs and imaging studies  CBC: Recent Labs  Lab 04/03/21 0953 04/05/21 0825 04/06/21 0453  WBC 17.2* 14.6* 22.6*  HGB 15.4 15.2* 12.9  HCT 45.7 45.9 39.2  MCV 85 85.3 84.8  PLT 344 323 235   Basic Metabolic  Panel: Recent Labs  Lab 04/03/21 0953 04/05/21 0825 04/06/21 0453  NA 140 137 136  K 4.1 4.4 4.7  CL 97 99 101  CO2 26 28 27   GLUCOSE 215* 251* 263*  BUN 15 12 15   CREATININE 0.90 0.60 0.82  CALCIUM 9.3 9.2 8.7*   GFR: Estimated Creatinine Clearance: 58 mL/min (by C-G formula based on SCr of 0.82 mg/dL). Liver Function Tests: Recent Labs  Lab 04/05/21 0825  AST 33  ALT 41  ALKPHOS 77  BILITOT 1.0  PROT 7.4  ALBUMIN 4.1   No results for input(s): LIPASE, AMYLASE in the last 168 hours. No results for input(s): AMMONIA in the last 168 hours. Coagulation Profile: No results for input(s): INR, PROTIME in the last 168 hours. Cardiac Enzymes: No results for input(s): CKTOTAL, CKMB, CKMBINDEX, TROPONINI in the last 168 hours. BNP (last 3 results) No results for input(s): PROBNP in the last 8760 hours. HbA1C: No results for input(s): HGBA1C in the last 72 hours. CBG: Recent Labs  Lab 04/05/21 1132 04/05/21 2052 04/06/21 0826 04/06/21 1221  GLUCAP 194* 299* 241* 232*   Lipid Profile: No results for input(s): CHOL, HDL, LDLCALC, TRIG, CHOLHDL, LDLDIRECT in the last 72 hours. Thyroid Function Tests: No results for input(s): TSH, T4TOTAL, FREET4, T3FREE, THYROIDAB in the last 72 hours. Anemia Panel: No results for input(s): VITAMINB12, FOLATE, FERRITIN, TIBC, IRON, RETICCTPCT in the last 72 hours. Sepsis Labs: No results for input(s): PROCALCITON, LATICACIDVEN in the last 168 hours.  Recent Results (from the past 240 hour(s))  Resp Panel by RT-PCR (Flu A&B, Covid) Nasopharyngeal Swab     Status: None   Collection Time: 04/05/21  8:25 AM   Specimen: Nasopharyngeal Swab; Nasopharyngeal(NP) swabs in vial transport medium  Result Value Ref Range Status   SARS Coronavirus 2 by RT PCR NEGATIVE NEGATIVE Final    Comment: (NOTE) SARS-CoV-2 target nucleic acids are NOT DETECTED.  The SARS-CoV-2 RNA is generally detectable in upper respiratory specimens during the acute  phase of infection. The lowest concentration of SARS-CoV-2 viral copies this assay can detect is 138 copies/mL. A negative result does not preclude SARS-Cov-2 infection and should not be used as the sole basis for treatment or other patient management decisions. A negative result may occur with  improper specimen collection/handling, submission of specimen other than nasopharyngeal swab, presence of viral mutation(s) within the areas targeted by this assay, and inadequate number of viral copies(<138 copies/mL). A negative result must be combined with clinical observations, patient history, and epidemiological information. The expected result is Negative.  Fact Sheet for Patients:  EntrepreneurPulse.com.au  Fact Sheet for Healthcare Providers:  IncredibleEmployment.be  This test is no t yet approved or cleared by the [place] FDA and  has been authorized for detection and/or diagnosis of SARS-CoV-2 by FDA under an Emergency Use Authorization (EUA). This EUA will remain  in effect (meaning this test can be used) for the duration of the COVID-19 declaration under Section 564(b)(1) of  the Act, 21 U.S.C.section 360bbb-3(b)(1), unless the authorization is terminated  or revoked sooner.       Influenza A by PCR NEGATIVE NEGATIVE Final   Influenza B by PCR NEGATIVE NEGATIVE Final    Comment: (NOTE) The Xpert Xpress SARS-CoV-2/FLU/RSV plus assay is intended as an aid in the diagnosis of influenza from Nasopharyngeal swab specimens and should not be used as a sole basis for treatment. Nasal washings and aspirates are unacceptable for Xpert Xpress SARS-CoV-2/FLU/RSV testing.  Fact Sheet for Patients: EntrepreneurPulse.com.au  Fact Sheet for Healthcare Providers: IncredibleEmployment.be  This test is not yet approved or cleared by the [place] FDA and has been authorized for detection and/or diagnosis of  SARS-CoV-2 by FDA under an Emergency Use Authorization (EUA). This EUA will remain in effect (meaning this test can be used) for the duration of the COVID-19 declaration under Section 564(b)(1) of the Act, 21 U.S.C. section 360bbb-3(b)(1), unless the authorization is terminated or revoked.  Performed at [facility], [address] [place], [place] [address]   Culture, blood (routine x 2) Call MD if unable to obtain prior to antibiotics being given     Status: None (Preliminary result)   Collection Time: 04/05/21 11:48 AM   Specimen: BLOOD  Result Value Ref Range Status   Specimen Description BLOOD RIGHT ANTECUBITAL  Final   Special Requests   Final    BOTTLES DRAWN AEROBIC AND ANAEROBIC Blood Culture adequate volume   Culture   Final    NO GROWTH < 24 HOURS Performed at [facility], [address] [place], [place] [address]    Report Status PENDING  Incomplete  Culture, blood (routine x 2) Call MD if unable to obtain prior to antibiotics being given     Status: None (Preliminary result)   Collection Time: 04/05/21 11:48 AM   Specimen: BLOOD  Result Value Ref Range Status   Specimen Description BLOOD BLOOD LEFT HAND  Final   Special Requests   Final    BOTTLES DRAWN AEROBIC AND ANAEROBIC Blood Culture results may not be optimal due to an inadequate volume of blood received in culture bottles   Culture   Final    NO GROWTH < 24 HOURS Performed at [facility], [address], [place], [place] [address]    Report Status PENDING  Incomplete         Radiology Studies: DG Chest Portable 1 View  Result Date: 04/05/2021 CLINICAL DATA:  Shortness of breath. EXAM: PORTABLE CHEST 1 VIEW COMPARISON:  03/24/2021 FINDINGS: Normal sized heart. Clear lungs. Thoracic spine degenerative changes and mild scoliosis. IMPRESSION: No acute abnormality. Electronically Signed   By: Claudie Revering M.D.   On: 04/05/2021 08:57        Scheduled Meds: . aspirin   81 mg Oral Daily  . atorvastatin  80 mg Oral Daily  . azithromycin  250 mg Oral Daily  . enoxaparin (LOVENOX) injection  40 mg Subcutaneous Q24H  . insulin aspart  0-5 Units Subcutaneous QHS  . insulin aspart  0-9 Units Subcutaneous TID WC  . ipratropium-albuterol  3 mL Nebulization Q6H  . metoprolol succinate  25 mg Oral Daily  . multivitamin with minerals  1 tablet Oral Daily  . pantoprazole  40 mg Oral Daily  . [START ON 04/07/2021] predniSONE  40 mg Oral Q breakfast  . ticagrelor  90 mg Oral BID   Continuous Infusions:   LOS: 0 days    Time spent: 32 minutes  Sharen Hones, MD [facility]   To contact the attending provider between 7A-7P or the covering provider during after hours 7P-7A, please log into the web site [email or web address] and access using universal [place] password for that web site. If you do not have the password, please call the hospital operator.  04/06/2021, 2:22 PM

## 2021-04-06 NOTE — Progress Notes (Signed)
PT Cancellation Note  Patient Details Name: Elaine Wright MRN: [id number] DOB: 01/30/1956   Cancelled Treatment:    Reason Eval/Treat Not Completed: PT screened, no needs identified, will sign off Per RN pt is ambulating independently without AD. No skilled needs at this time.   12:40 PM, 04/06/21 Shemaiah Round A. Saverio Danker PT, DPT Physical Therapist - [facility] A Carma Dwiggins 04/06/2021, 12:39 PM

## 2021-04-06 NOTE — Progress Notes (Signed)
OT Cancellation Note  Patient Details Name: Elaine Wright MRN: [id number] DOB: [birth year]   Cancelled Treatment:    Reason Eval/Treat Not Completed: OT screened, no needs identified, will sign off. Duplicate orders received. Pt evaluated by OT this admission and signed off as pt is near functional baseline for ADLs and mobility with strong family support. Please see 04/05/21 evaluation for recommendations. Thank you.    Dessie Coma, M.S. OTR/L  04/06/21, 7:59 AM  ascom [phone]

## 2021-04-06 NOTE — Plan of Care (Signed)
Pt c/o something stuck on her throat lozenges given. Mucinex given for cough. VSS.    Problem: Education: Goal: Knowledge of General Education information will improve Description: Including pain rating scale, medication(s)/side effects and non-pharmacologic comfort measures Outcome: Progressing   Problem: Health Behavior/Discharge Planning: Goal: Ability to manage health-related needs will improve Outcome: Progressing   Problem: Clinical Measurements: Goal: Ability to maintain clinical measurements within normal limits will improve Outcome: Progressing Goal: Will remain free from infection Outcome: Progressing Goal: Diagnostic test results will improve Outcome: Progressing Goal: Respiratory complications will improve Outcome: Progressing Goal: Cardiovascular complication will be avoided Outcome: Progressing   Problem: Activity: Goal: Risk for activity intolerance will decrease Outcome: Progressing   Problem: Nutrition: Goal: Adequate nutrition will be maintained Outcome: Progressing   Problem: Coping: Goal: Level of anxiety will decrease Outcome: Progressing   Problem: Elimination: Goal: Will not experience complications related to bowel motility Outcome: Progressing Goal: Will not experience complications related to urinary retention Outcome: Progressing   Problem: Pain Managment: Goal: General experience of comfort will improve Outcome: Progressing   Problem: Safety: Goal: Ability to remain free from injury will improve Outcome: Progressing   Problem: Skin Integrity: Goal: Risk for impaired skin integrity will decrease Outcome: Progressing

## 2021-04-06 NOTE — Progress Notes (Signed)
Pulmonary Medicine          Date: 04/06/2021,   MRN# [id number] Elaine Wright 1956/02/15     AdmissionWeight: 62.6 kg                 CurrentWeight: 62.6 kg   Referring physician: Dr Blaine Hamper   CHIEF COMPLAINT:   Recurrent Acute exacerbation of COPD   HISTORY OF PRESENT ILLNESS   Elaine Wright is a 65 y.o. female with medical history significant of CAD, STEMI (s/p of PCI/DES), HLD, DM, former smoker, OSA on CPAP, GERD, present with SOB. She was discharged from hospital last week. She has COPD and quit smoking last month after 40years.  She has had chronic bronchitis and brings up phelgm daily. She lost 10lbs in last 2 months. States "I can do without eating no appetite".  She does not feel fatigued and is full of energy and does not have diaphoresis. She has never had hemoptysis.  CT chest 2/22 shows emphysema without nodules/masses infiltrate.   04/06/21- patient reports that she started coughing and was surprised to notice food particles expectorated. She recognized pieces of meal from night prior.  Further she endorses aspiration of food prior to NSTEMI on previous admission and states maybe I dont notice how I chew and swallow.  I will place speech and swallow evaluation consult today.   PAST MEDICAL HISTORY   Past Medical History:  Diagnosis Date  . Arthritis   . Diabetes mellitus without complication ([place])   . GERD (gastroesophageal reflux disease)   . Sleep apnea      SURGICAL HISTORY   Past Surgical History:  Procedure Laterality Date  . ABDOMINAL HYSTERECTOMY  2003  . ANTERIOR AND POSTERIOR REPAIR N/A 04/14/2020   Procedure: ANTERIOR (CYSTOCELE) AND POSTERIOR REPAIR (RECTOCELE);  Surgeon: Schermerhorn, Gwen Her, MD;  Location: [facility];  Service: Gynecology;  Laterality: N/A;  . CORONARY/GRAFT ACUTE MI REVASCULARIZATION N/A 03/24/2021   Procedure: Coronary/Graft Acute MI Revascularization;  Surgeon: Burnell Blanks, MD;  Location: [place] [facility];  Service: Cardiovascular;  Laterality: N/A;  . EYE SURGERY  2011   Cataract bil  . LEFT HEART CATH AND CORONARY ANGIOGRAPHY N/A 03/24/2021   Procedure: LEFT HEART CATH AND CORONARY ANGIOGRAPHY;  Surgeon: Burnell Blanks, MD;  Location: [facility];  Service: Cardiovascular;  Laterality: N/A;  . PARTIAL KNEE ARTHROPLASTY  2009   right   . Thumb joint     Tendon from arm to replace arthrititic thumb  . TOTAL KNEE ARTHROPLASTY  12/23/2012   Procedure: TOTAL KNEE ARTHROPLASTY;  Surgeon: Ninetta Lights, MD;  Location: [place];  Service: Orthopedics;  Laterality: Left;  . TRACHELECTOMY N/A 04/14/2020   Procedure: LAPAROSCOPIC TRACHELECTOMY;  Surgeon: Schermerhorn, Gwen Her, MD;  Location: [facility];  Service: Gynecology;  Laterality: N/A;     FAMILY HISTORY   Family History  Problem Relation Age of Onset  . Breast cancer Neg Hx      SOCIAL HISTORY   Social History   Tobacco Use  . Smoking status: Former Smoker    Packs/day: 0.50    Years: 45.00    Pack years: 22.50    Types: Cigarettes    Quit date: 03/11/2021    Years since quitting: 0.0  . Smokeless tobacco: Never Used  Vaping Use  . Vaping Use: Some days  . Substances: Nicotine, Flavoring  Substance Use Topics  . Alcohol use: Yes    Comment:  2 glasses of wine a month  . Drug use: No     MEDICATIONS    Home Medication:    Current Medication:  Current Facility-Administered Medications:  .  acetaminophen (TYLENOL) tablet 650 mg, 650 mg, Oral, Q6H PRN, Lorretta HarpNiu, Xilin, MD .  albuterol (PROVENTIL) (2.5 MG/3ML) 0.083% nebulizer solution 2.5 mg, 2.5 mg, Nebulization, Q4H PRN, Lorretta HarpNiu, Xilin, MD .  [address][COMPLETED] azithromycin (ZITHROMAX) tablet 500 mg, 500 mg, Oral, Daily, 500 mg at 04/05/21 1001 **FOLLOWED BY** azithromycin (ZITHROMAX) tablet 250 mg, 250 mg, Oral, Daily, Lorretta HarpNiu, Xilin, MD .  dextromethorphan-guaiFENesin [facility](MUCINEX DM) 30-600 MG per 12 hr tablet 1 tablet, 1 tablet, Oral, BID PRN, Lorretta HarpNiu, Xilin, MD, 1 tablet  at 04/06/21 [phone] .  enoxaparin (LOVENOX) injection 40 mg, 40 mg, Subcutaneous, Q24H, Lorretta HarpNiu, Xilin, MD, 40 mg at 04/05/21 2049 .  hydrALAZINE (APRESOLINE) injection 5 mg, 5 mg, Intravenous, Q2H PRN, Lorretta HarpNiu, Xilin, MD .  insulin aspart (novoLOG) injection 0-5 Units, 0-5 Units, Subcutaneous, QHS, Lorretta HarpNiu, Xilin, MD, 3 Units at 04/05/21 2053 .  insulin aspart (novoLOG) injection 0-9 Units, 0-9 Units, Subcutaneous, TID WC, Lorretta HarpNiu, Xilin, MD, 2 Units at 04/05/21 1754 .  ipratropium-albuterol (DUONEB) 0.5-2.5 (3) MG/3ML nebulizer solution 3 mL, 3 mL, Nebulization, Q6H, Lorretta HarpNiu, Xilin, MD, 3 mL at 04/06/21 0157 .  menthol-cetylpyridinium (CEPACOL) lozenge 3 mg, 1 lozenge, Oral, PRN, Manuela SchwartzMorrison, Brenda, NP, 3 mg at 04/05/21 2339 .  methylPREDNISolone sodium succinate (SOLU-MEDROL) 40 mg/mL injection 40 mg, 40 mg, Intravenous, Q12H, Lorretta HarpNiu, Xilin, MD, 40 mg at 04/05/21 2049 .  ondansetron (ZOFRAN) injection 4 mg, 4 mg, Intravenous, Q8H PRN, Lorretta HarpNiu, Xilin, MD, 4 mg at 04/05/21 1022    ALLERGIES   Morphine and related     REVIEW OF SYSTEMS    Review of Systems:  Gen:  Denies  fever, sweats, chills weigh loss  HEENT: Denies blurred vision, double vision, ear pain, eye pain, hearing loss, nose bleeds, sore throat Cardiac:  No dizziness, chest pain or heaviness, chest tightness,edema Resp:   Denies cough or sputum porduction, shortness of breath,wheezing, hemoptysis,  Gi: Denies swallowing difficulty, stomach pain, nausea or vomiting, diarrhea, constipation, bowel incontinence Gu:  Denies bladder incontinence, burning urine Ext:   Denies Joint pain, stiffness or swelling Skin: Denies  skin rash, easy bruising or bleeding or hives Endoc:  Denies polyuria, polydipsia , polyphagia or weight change Psych:   Denies depression, insomnia or hallucinations   Other:  All other systems negative   VS: BP 105/71 (BP Location: Left Arm)   Pulse 81   Temp 98 F (36.7 C)   Resp 17   Ht 5\' 1"  (1.549 m)   Wt 62.6 kg   SpO2 98%    BMI 26.07 kg/m      PHYSICAL EXAM    GENERAL:NAD, no fevers, chills, no weakness no fatigue HEAD: Normocephalic, atraumatic.  EYES: Pupils equal, round, reactive to light. Extraocular muscles intact. No scleral icterus.  MOUTH: Moist mucosal membrane. Dentition intact. No abscess noted.  EAR, NOSE, THROAT: Clear without exudates. No external lesions.  NECK: Supple. No thyromegaly. No nodules. No JVD.  PULMONARY: mild rhonchi.  CARDIOVASCULAR: S1 and S2. Regular rate and rhythm. No murmurs, rubs, or gallops. No edema. Pedal pulses 2+ bilaterally.  GASTROINTESTINAL: Soft, nontender, nondistended. No masses. Positive bowel sounds. No hepatosplenomegaly.  MUSCULOSKELETAL: No swelling, clubbing, or edema. Range of motion full in all extremities.  NEUROLOGIC: Cranial nerves II through XII are intact. No gross focal neurological deficits. Sensation intact. Reflexes intact.  SKIN: No ulceration, lesions, rashes, or cyanosis. Skin warm and dry. Turgor intact.  PSYCHIATRIC: Mood, affect within normal limits. The patient is awake, alert and oriented x 3. Insight, judgment intact.       IMAGING    DG Chest 2 View  Result Date: 03/21/2021 CLINICAL DATA:  65 year old female with shortness of breath. EXAM: CHEST - 2 VIEW COMPARISON:  Chest CT dated 02/07/2021. FINDINGS: Right infrahilar density may represent atelectasis or crowding of the hilar structure. Developing infiltrate is less likely but not excluded clinical correlation is recommended. No pleural effusion, or pneumothorax. The cardiac silhouette is within limits. Atherosclerotic calcification of the aorta. Degenerative changes of the spine. No acute osseous pathology. IMPRESSION: Right infrahilar atelectasis versus less likely developing infiltrate. Clinical correlation is recommended. Electronically Signed   By: Anner Crete M.D.   On: 03/21/2021 16:55   CT Soft Tissue Neck W Contrast  Result Date: 03/21/2021 CLINICAL DATA:   Shortness of breath and throat tightness. Cough for 2 weeks. EXAM: CT NECK WITH CONTRAST TECHNIQUE: Multidetector CT imaging of the neck was performed using the standard protocol following the bolus administration of intravenous contrast. CONTRAST:  68mL OMNIPAQUE IOHEXOL 300 MG/ML  SOLN COMPARISON:  None. FINDINGS: Pharynx and larynx: No evidence of mass or swelling. No fluid collection or inflammatory changes in the parapharyngeal or retropharyngeal spaces. Salivary glands: Asymmetric fatty atrophy of the left submandibular gland. 6 mm stone in the distal left submandibular duct. Unremarkable appearance of the right submandibular and both parotid glands. Thyroid: Unremarkable. Lymph nodes: No enlarged or suspicious lymph nodes in the neck. Vascular: Major vascular structures of the neck are grossly patent. Limited intracranial: Unremarkable. Visualized orbits: Bilateral cataract extraction. Mastoids and visualized paranasal sinuses: Mild mucosal thickening in the maxillary sinuses with bubbly secretions on the right. Clear mastoid air cells. Skeleton: Moderate mid and lower cervical disc degeneration. Mild cervical facet arthrosis. Upper chest: Clear lung apices. Other: None. IMPRESSION: 1. No acute abnormality identified in the neck. 2. 6 mm stone in the left submandibular duct with submandibular gland atrophy. Electronically Signed   By: Logan Bores M.D.   On: 03/21/2021 19:20   CARDIAC CATHETERIZATION  Result Date: 03/24/2021  Ost LAD to Prox LAD lesion is 80% stenosed.  A drug-eluting stent was successfully placed using a STENT SYNERGY DES 3X12.  Post intervention, there is a 0% residual stenosis.  1. Severe proximal LAD stenosis best seen in the LAO view. This was confirmed with IVUS imaging. She likely became ischemic from this lesion when she became hypoxic and hypertensive at home. 2. Successful PTCA/DES x 1 proximal LAD 3. No obstructive disease in the left main, Circumflex or RCA 4. Elevated LVEDP  Recommendations: Will admit to the ICU on the critical care team. She will remain intubated while her respiratory status is optimized. Continue Aggrastat drip for 2 hours post PCI. Will continue DAPT with ASA and Brilinta for at least one year. I will start a high intensity statin. One dose IV Lasix this am. Echo later today.   DG Chest Portable 1 View  Result Date: 04/05/2021 CLINICAL DATA:  Shortness of breath. EXAM: PORTABLE CHEST 1 VIEW COMPARISON:  03/24/2021 FINDINGS: Normal sized heart. Clear lungs. Thoracic spine degenerative changes and mild scoliosis. IMPRESSION: No acute abnormality. Electronically Signed   By: Claudie Revering M.D.   On: 04/05/2021 08:57   DG Chest Port 1 View  Result Date: 03/24/2021 CLINICAL DATA:  Current history of COPD, diabetes and sleep apnea, presenting  with acute onset of chest pain with EKG demonstrating an anterior STEMI. Hypoxemia. Intubation. EXAM: PORTABLE CHEST 1 VIEW COMPARISON:  03/21/2021 and earlier. FINDINGS: Endotracheal tube tip in satisfactory position projecting approximately 5 cm above the carina. Cardiac silhouette normal in size, unchanged. Lungs clear. Pulmonary vascularity normal. No visible pleural effusions. IMPRESSION: 1. Endotracheal tube tip in satisfactory position projecting approximately 5 cm above the carina. 2. No acute cardiopulmonary disease. Electronically Signed   By: Evangeline Dakin M.D.   On: 03/24/2021 09:44   ECHOCARDIOGRAM COMPLETE  Result Date: 03/25/2021    ECHOCARDIOGRAM REPORT   Patient Name:   Elaine Wright Date of Exam: 03/25/2021 Medical Rec #:  [id number]       Height:       60.0 in Accession #:    [id number]      Weight:       147.0 lb Date of Birth:  September 21, 1956      BSA:          1.638 m Patient Age:    34 years        BP:           96/74 mmHg Patient Gender: F               HR:           90 bpm. Exam Location:  [facility] Procedure: 2D Echo and Strain Analysis Indications:     CAD Native Vessel I25.10  History:         Patient  has no prior history of Echocardiogram examinations.  Sonographer:     Arville Go RDCS Referring Phys:  [place] Diagnosing Phys: Buford Dresser MD  Sonographer Comments: Technically difficult study due to poor echo windows. IMPRESSIONS  1. Left ventricular ejection fraction, by estimation, is 60 to 65%. The left ventricle has normal function. The left ventricle has no regional wall motion abnormalities. There is mild concentric left ventricular hypertrophy. Left ventricular diastolic parameters were normal.  2. Right ventricular systolic function is normal. The right ventricular size is normal.  3. The mitral valve is normal in structure. Trivial mitral valve regurgitation. No evidence of mitral stenosis.  4. The aortic valve is tricuspid. Aortic valve regurgitation is not visualized. No aortic stenosis is present. Comparison(s): No prior Echocardiogram. Conclusion(s)/Recommendation(s): Technically limited images. Grossly normal LVEF, no focal wall motion abnormalities appreciated. FINDINGS  Left Ventricle: Left ventricular ejection fraction, by estimation, is 60 to 65%. The left ventricle has normal function. The left ventricle has no regional wall motion abnormalities. Global longitudinal strain performed but not reported based on interpreter judgement due to suboptimal tracking. The left ventricular internal cavity size was normal in size. There is mild concentric left ventricular hypertrophy. Left ventricular diastolic parameters were normal. Right Ventricle: The right ventricular size is normal. Right vetricular wall thickness was not well visualized. Right ventricular systolic function is normal. Left Atrium: Left atrial size was normal in size. Right Atrium: Right atrial size was normal in size. Pericardium: There is no evidence of pericardial effusion. Mitral Valve: The mitral valve is normal in structure. Trivial mitral valve regurgitation. No evidence of mitral valve  stenosis. Tricuspid Valve: The tricuspid valve is normal in structure. Tricuspid valve regurgitation is trivial. No evidence of tricuspid stenosis. Aortic Valve: The aortic valve is tricuspid. Aortic valve regurgitation is not visualized. No aortic stenosis is present. Aortic valve peak gradient measures 9.2 mmHg. Pulmonic Valve: The pulmonic valve was grossly normal. Pulmonic valve regurgitation  is not visualized. No evidence of pulmonic stenosis. Aorta: The aortic root and ascending aorta are structurally normal, with no evidence of dilitation. IAS/Shunts: The atrial septum is grossly normal.  LEFT VENTRICLE PLAX 2D LVIDd:         4.39 cm     Diastology LVIDs:         3.11 cm     LV e' medial:    7.62 cm/s LV PW:         1.24 cm     LV E/e' medial:  9.2 LV IVS:        1.10 cm     LV e' lateral:   6.53 cm/s LVOT diam:     1.90 cm     LV E/e' lateral: 10.8 LV SV:         67 LV SV Index:   41 LVOT Area:     2.84 cm  LV Volumes (MOD) LV vol d, MOD A4C: 58.1 ml LV vol s, MOD A4C: 25.7 ml LV SV MOD A4C:     58.1 ml LEFT ATRIUM             Index LA diam:        3.50 cm 2.14 cm/m LA Vol (A2C):   39.7 ml 24.24 ml/m LA Vol (A4C):   32.9 ml 20.09 ml/m LA Biplane Vol: 37.1 ml 22.65 ml/m  AORTIC VALVE                PULMONIC VALVE AV Area (Vmax): 2.09 cm    PV Vmax:       1.15 m/s AV Vmax:        152.00 cm/s PV Peak grad:  5.3 mmHg AV Peak Grad:   9.2 mmHg LVOT Vmax:      112.00 cm/s LVOT Vmean:     80.800 cm/s LVOT VTI:       0.237 m  AORTA Ao Root diam: 2.90 cm Ao Asc diam:  3.00 cm MITRAL VALVE MV Area (PHT): 5.34 cm     SHUNTS MV Decel Time: 142 msec     Systemic VTI:  0.24 m MV E velocity: 70.30 cm/s   Systemic Diam: 1.90 cm MV A velocity: 119.00 cm/s MV E/A ratio:  0.59 Bridgette Christopher MD Electronically signed by Buford Dresser MD Signature Date/Time: 03/25/2021/5:30:23 PM    Final       ASSESSMENT/PLAN   Acute exacerbation of COPD -patient reports worsening phlegm production in high volume and  dyspnea after stopping steroids -BNP is normal   -shes already improved post IV steroids and nebulizer -agree with zithromax Patient feels now close to baseline Incentive spirometry and PT/OT -speech and swallow evaluation today.    Acute hypoxemic respiratory failure  - may wean O2 to Room air  - due to AE COPD -ddimer to rule out PE since she had acute onset SOB -procalcitonin    OSA on CPAP -will order QHS CPAP orders by RT     Thank you for allowing me to participate in the care of this patient.   Patient/Family are satisfied with care plan and all questions have been answered.  This document was prepared using Dragon voice recognition software and may include unintentional dictation errors.     Ottie Glazier, M.D.  Division of [place] [facility]

## 2021-04-06 NOTE — Progress Notes (Signed)
PT Cancellation Note  Patient Details Name: MARGAUX ENGEN MRN: [id number] DOB: Jan 16, 1956   Cancelled Treatment:    Reason Eval/Treat Not Completed: Patient at procedure or test/unavailable   Lando Alcalde A Dayona Shaheen 04/06/2021, 11:35 AM

## 2021-04-06 NOTE — Evaluation (Addendum)
Clinical/Bedside Swallow Evaluation Patient Details  Name: Elaine Wright MRN: [id number] Date of Birth: [birth year]  Today's Date: 04/06/2021 Time: SLP Start Time (ACUTE ONLY): 0845 SLP Stop Time (ACUTE ONLY): 4098; then again at 1220-1245 SLP Time Calculation (min) (ACUTE ONLY): 60 min  Past Medical History:  Past Medical History:  Diagnosis Date  . Arthritis   . Diabetes mellitus without complication ([place])   . GERD (gastroesophageal reflux disease)   . Sleep apnea    Past Surgical History:  Past Surgical History:  Procedure Laterality Date  . ABDOMINAL HYSTERECTOMY  2003  . ANTERIOR AND POSTERIOR REPAIR N/A 04/14/2020   Procedure: ANTERIOR (CYSTOCELE) AND POSTERIOR REPAIR (RECTOCELE);  Surgeon: Schermerhorn, Gwen Her, MD;  Location: [facility];  Service: Gynecology;  Laterality: N/A;  . CORONARY/GRAFT ACUTE MI REVASCULARIZATION N/A 03/24/2021   Procedure: Coronary/Graft Acute MI Revascularization;  Surgeon: Burnell Blanks, MD;  Location: [place] [facility];  Service: Cardiovascular;  Laterality: N/A;  . EYE SURGERY  2011   Cataract bil  . LEFT HEART CATH AND CORONARY ANGIOGRAPHY N/A 03/24/2021   Procedure: LEFT HEART CATH AND CORONARY ANGIOGRAPHY;  Surgeon: Burnell Blanks, MD;  Location: [place] [facility];  Service: Cardiovascular;  Laterality: N/A;  . PARTIAL KNEE ARTHROPLASTY  2009   right   . Thumb joint     Tendon from arm to replace arthrititic thumb  . TOTAL KNEE ARTHROPLASTY  12/23/2012   Procedure: TOTAL KNEE ARTHROPLASTY;  Surgeon: Ninetta Lights, MD;  Location: [facility];  Service: Orthopedics;  Laterality: Left;  . TRACHELECTOMY N/A 04/14/2020   Procedure: LAPAROSCOPIC TRACHELECTOMY;  Surgeon: Schermerhorn, Gwen Her, MD;  Location: [facility];  Service: Gynecology;  Laterality: N/A;   HPI:  Pt is a 65 y.o. female with medical history significant of CAD, recent STEMI (s/p of PCI/DES), HLD, DM, former smoker, OSA on CPAP, GERD, present with SOB.     Patient  states that she has shortness of breath for more than 2 days, which has been progressively worsening.  Patient has cough with some mucus production, denies fever or chills.  No chest pain.  Patient has nausea, no vomiting, diarrhea or abdominal.  No symptoms of UTI.  CXR: No acute abnormality. Prior Chest CT in 01/2021 revealed no infiltrates; emphysema. No other Lung/chest Imaging in 5 years per chart.  Pt denies any other episodes of Pulmonary issues or pneumonia. Hacking, dry cough at baseline.   Assessment / Plan / Recommendation Clinical Impression  Pt appears to present w/ adequate oropharyngeal phase swallowing function w/ No overt oropharyngeal phase dysphagia appreciated; No neuromuscular swallowing deficits appreciated. Pt is at reduced risk for aspiration from an oropharyngeal phase standpoint following general aspiration precautions. Pt has a baseline of GERD/REFLUX, but NOT on a PPI currently. She self-manages w/ TUMS and Rolaids at home but stopped taking a PPI d/t REFLUX "improving". Pt has had recent medical illness, Stress. She endorses episodes of Regurgitation at home awaking during the night w/ Coughing and Vomiting episodes. ANY Dysmotility or Regurgitation of Reflux material can increase risk for aspiration of the Reflux material during Retrograde flow thus impact Pulmonary status. Pt described ongoing issues w/ "pressure in my chest" pointing to upper sternum then described the discomfort moves superiorly pointing to her Sternal Notch area.  Pt sat upright in bed during Lunch meal consuming trials of thin liquids via Straw/Cup and solids foods w/ no immediate, overt clinical s/s of aspiration noted; clear vocal quality b/t trials, no decline in  pulmonary status, no multiple swallows noted post initial pharyngeal swallow. Oral phase appeared [facility] for bolus management and timely A-P transfer/clearing of material. OM exam was [facility] for oral clearing; lingual/labial movements. No unilateral  weakness. Speech clear. Of Note, pt demonstrated fairly quick s/s of Esophageal irritation -- globus feeling and dry, hacking Cough (MOD) after meal w/ feeling of Regurgitation. Ice Chips given for pt to suck on and use semi-dry swallowing/peristalsis to aid in reducing Backflow/Regurgitation from occurring.  Discussed suspicion of irritation of Esophagus from REFLUX and Backflow occurring.    Recommend continue Regluar diet for ease of choice of manageable foods as tolerates(w/ less meats/breads in diet, moistened foods) w/ thin liquids via Cup/less straw use d/t air swallowing; all foods Cut SMALL; general aspiration precautions. Rest Breaks during meals/oral intake to allow for Esophageal clearing. GERD/REFLUX precautions strongly recommended to lessen chance for Regurgitation. Recommend pt to f/u w/ GI for assessment and management of GERD/Reflux and tx as indicated. MD initiated a PPI today, this admit. Thorough Education and Disucssion and Handouts given on Esophageal Dysmotility and REFLUX, and strategies to reduce such. MD updated and agreed w/ recommendation for a DG Esophagus(barium study) while admitted and GI f/u at D/C. MD will reconsult if any new ST needs while admitted. SLP Visit Diagnosis: Dysphagia, unspecified (R13.10) (GERD, REFLUX)    Aspiration Risk   (reduced from an oropharyngeal phase standpoint)    Diet Recommendation  Regular diet w/ meats/foods Cut Small, Moistened well. Thin liquids. General aspiration precautions. STRICT GERD/REFLUX PRECAUTIONS.  Medication Administration: Whole meds with liquid    Other  Recommendations Recommended Consults: Consider GI evaluation;Consider esophageal assessment Oral Care Recommendations: Oral care BID;Patient independent with oral care Other Recommendations:  (n/a)   Follow up Recommendations None      Frequency and Duration  (n/a)   (n/a)       Prognosis Prognosis for Safe Diet Advancement: Good Barriers to Reach Goals:  Severity of deficits;Time post onset (GERD/REFLUX)      Swallow Study   General Date of Onset: 04/05/21 HPI: Pt is a 65 y.o. female with medical history significant of CAD, recent STEMI (s/p of PCI/DES), HLD, DM, former smoker, OSA on CPAP, GERD, present with SOB.     Patient states that she has shortness of breath for more than 2 days, which has been progressively worsening.  Patient has cough with some mucus production, denies fever or chills.  No chest pain.  Patient has nausea, no vomiting, diarrhea or abdominal.  No symptoms of UTI.  CXR: No acute abnormality. Prior Chest CT in 01/2021 revealed no infiltrates; emphysema. No other Lung/chest Imaging in 5 years per chart.  Pt denies any other episodes of Pulmonary issues or pneumonia. Hacking, dry cough at baseline. Type of Study: Bedside Swallow Evaluation Previous Swallow Assessment: none Diet Prior to this Study: Regular;Thin liquids Temperature Spikes Noted: No (currently on prednisone) Respiratory Status: Nasal cannula (2L) History of Recent Intubation: No Behavior/Cognition: Alert;Cooperative;Pleasant mood Oral Cavity Assessment: Within Functional Limits Oral Care Completed by SLP: Recent completion by staff Oral Cavity - Dentition: Adequate natural dentition Vision: Functional for self-feeding Self-Feeding Abilities: Able to feed self Patient Positioning: Upright in bed Baseline Vocal Quality: Normal Volitional Cough: Strong Volitional Swallow: Able to elicit    Oral/Motor/Sensory Function Overall Oral Motor/Sensory Function: Within functional limits   Ice Chips Ice chips: Within functional limits Presentation: Spoon;Self Fed (5+)   Thin Liquid Thin Liquid: Within functional limits Presentation: Cup;Self Fed (4ozs+)  Nectar Thick Nectar Thick Liquid: Not tested   Honey Thick Honey Thick Liquid: Not tested   Puree Puree: Not tested   Solid     Solid: Within functional limits Presentation: Self Fed (sandwich - 100%)         Orinda Kenner, MS, CCC-SLP Speech Language Pathologist Rehab Services [phone] Phyllis Whitefield 04/06/2021,2:46 PM

## 2021-04-07 DIAGNOSIS — J9601 Acute respiratory failure with hypoxia: Secondary | ICD-10-CM | POA: Diagnosis not present

## 2021-04-07 DIAGNOSIS — R778 Other specified abnormalities of plasma proteins: Secondary | ICD-10-CM

## 2021-04-07 DIAGNOSIS — J441 Chronic obstructive pulmonary disease with (acute) exacerbation: Secondary | ICD-10-CM | POA: Diagnosis not present

## 2021-04-07 LAB — GLUCOSE, CAPILLARY: Glucose-Capillary: 170 mg/dL — ABNORMAL HIGH (ref 70–99)

## 2021-04-07 MED ORDER — PANTOPRAZOLE SODIUM 40 MG PO TBEC: 40.0000 mg | DELAYED_RELEASE_TABLET | Freq: Every day | ORAL | 0 refills | Status: DC

## 2021-04-07 MED ORDER — FLUTICASONE-SALMETEROL 250-50 MCG/DOSE IN AEPB: 1.0000 | INHALATION_SPRAY | Freq: Two times a day (BID) | RESPIRATORY_TRACT | 0 refills | Status: DC

## 2021-04-07 NOTE — Plan of Care (Signed)
Pt is AAOx4. Pt denies any pain. Pt had increased coughing with vomiting. Zofran given. Maalox was also ordred PRN for her GERD. Pt verbalized relief. All needs attended. Call light is within reach. Safety measures maintained. Will continue to monitor.    Problem: Education: Goal: Knowledge of General Education information will improve Description: Including pain rating scale, medication(s)/side effects and non-pharmacologic comfort measures Outcome: Progressing   Problem: Health Behavior/Discharge Planning: Goal: Ability to manage health-related needs will improve Outcome: Progressing   Problem: Clinical Measurements: Goal: Ability to maintain clinical measurements within normal limits will improve Outcome: Progressing Goal: Will remain free from infection Outcome: Progressing Goal: Diagnostic test results will improve Outcome: Progressing Goal: Respiratory complications will improve Outcome: Progressing Goal: Cardiovascular complication will be avoided Outcome: Progressing   Problem: Activity: Goal: Risk for activity intolerance will decrease Outcome: Progressing   Problem: Nutrition: Goal: Adequate nutrition will be maintained Outcome: Progressing   Problem: Coping: Goal: Level of anxiety will decrease Outcome: Progressing   Problem: Elimination: Goal: Will not experience complications related to bowel motility Outcome: Progressing Goal: Will not experience complications related to urinary retention Outcome: Progressing   Problem: Pain Managment: Goal: General experience of comfort will improve Outcome: Progressing   Problem: Safety: Goal: Ability to remain free from injury will improve Outcome: Progressing   Problem: Skin Integrity: Goal: Risk for impaired skin integrity will decrease Outcome: Progressing

## 2021-04-07 NOTE — Discharge Summary (Signed)
Physician Discharge Summary  Patient ID: Elaine Wright MRN: [id number] DOB/AGE: 04/12/1956 65 y.o.  Admit date: 04/05/2021 Discharge date: 04/07/2021  Admission Diagnoses:  Discharge Diagnoses:  Principal Problem:   Acute respiratory failure with hypoxia ([place]) Active Problems:   CAD (coronary artery disease)   Diabetes mellitus without complication ([facility])   Sleep apnea   HLD (hyperlipidemia)   Elevated troponin   GERD (gastroesophageal reflux disease)   COPD exacerbation [facility])   Discharged Condition: good  Hospital Course:  Elaine Fretwell Johnsonis a 65 y.o.femalewith medical history significant ofCAD, STEMI (s/p of PCI/DES), HLD, DM, former smoker, OSA on CPAP, GERD, present with SOB. Has significant acid reflex, she had a sudden onset of short of breath at midnight after an episode of acid reflux.  She started have significant wheezing.  She came to the hospital with a found to have hypoxemia, she was diagnosed with a COPD exacerbation and placed on steroids and Zithromax.  #1.  Acute hypoxemic respiratory failure. COPD exacerbation. Gastroesophageal reflux disease with aspiration. Patient condition was triggered by aspiration from acid reflux.  She also has symptoms of dysphagia. Patient has been seen by speech therapy, no pharyngeal phase of dysphagia.  Suspect esophageal phase. Barium esophagram showed esophageal spasm without stricture. Will be treated with oral steroids, she is able to wean off oxygen.  Her symptomatology resolved.  She still has significant acid reflux in the nighttime, advised her to sleep with elevated head, continue Protonix. Schedule appointment with GI for possible EGD as outpatient.  2.  Coronary disease with mild elevation troponin. Mild elevation troponin could be secondary to aspiration.  3.  Type 2 diabetes. Relative controlled with A1c 6.9.  Continue sliding scale insulin. Elevated glucose secondary to steroids.  Steroid changed to oral  now.    Consults: pulmonary/intensive care  Significant Diagnostic Studies:  ESOPHOGRAM / BARIUM SWALLOW / BARIUM TABLET STUDY  TECHNIQUE: Combined double contrast and single contrast examination performed using effervescent crystals, thick barium liquid, and thin barium liquid. The patient was observed with fluoroscopy swallowing a 13 mm barium sulphate tablet.  FLUOROSCOPY TIME:  Fluoroscopy Time:  0.7 minutes  Radiation Exposure Index (if provided by the fluoroscopic device): 4.6 mGy  Number of Acquired Spot Images: 0  COMPARISON:  None.  FINDINGS: Normal pharyngeal anatomy and motility. Contrast flowed freely through the esophagus without evidence of a stricture or mass. Normal esophageal mucosa without evidence of irregularity or ulceration. Mild tertiary contractions intermittently in the distal esophagus as can be seen with esophageal spasm or presbyesophagus. No evidence of reflux. No definite hiatal hernia was demonstrated.  At the end of the examination a 13 mm barium tablet was administered which transited through the esophagus and esophagogastric junction without delay.  IMPRESSION: 1. Mild tertiary contractions intermittently in the distal esophagus as can be seen with esophageal spasm or presbyesophagus. 2. No esophageal stricture.   Electronically Signed   By: Kathreen Devoid   On: 04/06/2021 15:06  PORTABLE CHEST 1 VIEW  COMPARISON:  03/24/2021  FINDINGS: Normal sized heart. Clear lungs. Thoracic spine degenerative changes and mild scoliosis.  IMPRESSION: No acute abnormality.     Treatments: steroids  Discharge Exam: Blood pressure 105/75, pulse 74, temperature 97.8 F (36.6 C), temperature source Oral, resp. rate 18, height 5\' 1"  (1.549 m), weight 62.6 kg, SpO2 90 %. General appearance: alert and cooperative Resp: clear to auscultation bilaterally Cardio: regular rate and rhythm, S1, S2 normal, no murmur, click, rub or  gallop GI: soft,  non-tender; bowel sounds normal; no masses,  no organomegaly Extremities: extremities normal, atraumatic, no cyanosis or edema  Disposition: Discharge disposition: 01-Home or Self Care       Discharge Instructions    Diet - low sodium heart healthy   Complete by: As directed    Increase activity slowly   Complete by: As directed      Allergies as of 04/07/2021      Reactions   Morphine And Related Other (See Comments)   Confusion, hallucinations      Medication List    TAKE these medications   albuterol 108 (90 Base) MCG/ACT inhaler Commonly known as: VENTOLIN HFA Inhale 2 puffs into the lungs every 6 (six) hours as needed for wheezing or shortness of breath. What changed: how much to take   alendronate 70 MG tablet Commonly known as: FOSAMAX Take 70 mg by mouth every Friday.   aspirin 81 MG chewable tablet Chew 1 tablet (81 mg total) by mouth daily.   atorvastatin 80 MG tablet Commonly known as: LIPITOR Take 1 tablet (80 mg total) by mouth daily.   benzonatate 100 MG capsule Commonly known as: TESSALON Take 100 mg by mouth 3 (three) times daily as needed for cough.   dextromethorphan-guaiFENesin 30-600 MG 12hr tablet Commonly known as: MUCINEX DM Take 1 tablet by mouth 2 (two) times daily as needed for cough.   Fluticasone-Salmeterol 250-50 MCG/DOSE Aepb Commonly known as: Advair Diskus Inhale 1 puff into the lungs in the morning and at bedtime.   guaiFENesin-codeine 100-10 MG/5ML syrup Take 5 mLs by mouth every 6 (six) hours as needed for cough.   ipratropium-albuterol 0.5-2.5 (3) MG/3ML Soln Commonly known as: DUONEB Inhale 3 mLs into the lungs 4 (four) times daily.   metFORMIN 500 MG 24 hr tablet Commonly known as: GLUCOPHAGE-XR Take 500 mg by mouth 2 (two) times daily.   metoprolol succinate 25 MG 24 hr tablet Commonly known as: TOPROL-XL Take 1 tablet (25 mg total) by mouth daily.   metroNIDAZOLE 0.75 % cream Commonly known  as: METROCREAM Apply topically at bedtime. qhs to face for Rosacea What changed:   how much to take  when to take this  reasons to take this  additional instructions   Multi-Vitamin tablet Take 1 tablet by mouth daily.   nitroGLYCERIN 0.4 MG SL tablet Commonly known as: NITROSTAT Place 1 tablet (0.4 mg total) under the tongue every 5 (five) minutes as needed for chest pain.   pantoprazole 40 MG tablet Commonly known as: PROTONIX Take 1 tablet (40 mg total) by mouth daily. Start taking on: April 08, 2021   [facility] 1 drop into both eyes daily as needed (when using contacts).   ticagrelor 90 MG Tabs tablet Commonly known as: BRILINTA Take 1 tablet (90 mg total) by mouth 2 (two) times daily.       Follow-up Information    [name], MD Follow up in 1 week(s).   Specialty: Family Medicine Contact information: [address] [place] [address] [phone]        Nelva Bush, MD .   Specialty: Cardiology Contact information: [place] [address] [phone]        Jonathon Bellows, MD Follow up in 2 week(s).   Specialty: Gastroenterology Contact information: [place] [address] [phone]               Signed: Sharen Hones 04/07/2021, 9:15 AM

## 2021-04-10 LAB — CULTURE, BLOOD (ROUTINE X 2)
Culture: NO GROWTH
Culture: NO GROWTH
Special Requests: ADEQUATE

## 2021-04-30 ENCOUNTER — Other Ambulatory Visit: Payer: Self-pay

## 2021-04-30 ENCOUNTER — Other Ambulatory Visit: Payer: Self-pay | Admitting: *Deleted

## 2021-04-30 ENCOUNTER — Encounter: Payer: Self-pay | Admitting: Medical

## 2021-04-30 ENCOUNTER — Ambulatory Visit: Payer: Medicare Other | Admitting: Medical

## 2021-04-30 VITALS — BP 120/82 | HR 72 | Ht 61.0 in | Wt 141.0 lb

## 2021-04-30 DIAGNOSIS — I213 ST elevation (STEMI) myocardial infarction of unspecified site: Secondary | ICD-10-CM

## 2021-04-30 DIAGNOSIS — Z955 Presence of coronary angioplasty implant and graft: Secondary | ICD-10-CM

## 2021-04-30 DIAGNOSIS — I25118 Atherosclerotic heart disease of native coronary artery with other forms of angina pectoris: Secondary | ICD-10-CM | POA: Diagnosis not present

## 2021-04-30 DIAGNOSIS — I5032 Chronic diastolic (congestive) heart failure: Secondary | ICD-10-CM | POA: Diagnosis not present

## 2021-04-30 DIAGNOSIS — Z720 Tobacco use: Secondary | ICD-10-CM

## 2021-04-30 DIAGNOSIS — E785 Hyperlipidemia, unspecified: Secondary | ICD-10-CM

## 2021-04-30 DIAGNOSIS — D72829 Elevated white blood cell count, unspecified: Secondary | ICD-10-CM | POA: Diagnosis not present

## 2021-04-30 NOTE — Progress Notes (Signed)
Cardiology Office Note:    Date:  04/30/2021   ID:  Elaine Wright, DOB [birth year], MRN [id number]  PCP:  [name], MD  [facility] Cardiologist:  Nelva Bush, MD  [facility] Electrophysiologist:  None   Referring MD: [name], MD   Chief Complaint: 1 month follow-up  History of Present Illness:    Elaine Wright is a 65 y.o. female with a hx of CAD s/p anterolateral STEMI with PCI/DES to ost/prox LAD, osteoarthritis, diabetes type 2, GERD, OSA, hyperlipidemia, HFpEF, tobacco use presents for follow-up.  She presented to [facility] ED 03/24/21 with SOB and chest pain and CODE STEMI was called. She was taken emergently to cath lab after intubation. Cath showed ostial proximal LAD 80% stenosis which was treated with DES. Echo showed LVEF 60-65%, no WMA, mild LVH, normal diastolic function. DAPT for 1 year was recommended.   She was seen in the office 04/03/21 and was doing well from a cardiac standpoint. Lasix was discontinued due to normal LVEF and no diastolic dysfunction.  Patient was hospitalized 04/05/22 for acute respiratory failure with hypoxia. It was felt condition was triggered by aspiration from GERD. Also with dysphagia. She was treated with oral steroids  For COPD and scheduled with GI.   Today, the patient reports she has been doing well. She is doing more day by day, but does have some SOB. Still not where she was before hospitalizations. Still has some coughing and hoarseness. She does not use O2. No chest pain. Although did take some SL Nitro for breathing, but this did not help. Breathing is slowly improving. Also, she is on Brilinta. No LLE, fever, chills, orthopnea, pnd. She has appointment with pulmonary Wednesday and later this month she will see GI. Has not started cardiac rehab. Will refer back. Still not smoking.   Past Medical History:  Diagnosis Date  . Arthritis   . Diabetes mellitus without complication ([place])   . GERD (gastroesophageal  reflux disease)   . Sleep apnea     Past Surgical History:  Procedure Laterality Date  . ABDOMINAL HYSTERECTOMY  2003  . ANTERIOR AND POSTERIOR REPAIR N/A 04/14/2020   Procedure: ANTERIOR (CYSTOCELE) AND POSTERIOR REPAIR (RECTOCELE);  Surgeon: Schermerhorn, Gwen Her, MD;  Location: [facility] ORS;  Service: Gynecology;  Laterality: N/A;  . CORONARY/GRAFT ACUTE MI REVASCULARIZATION N/A 03/24/2021   Procedure: Coronary/Graft Acute MI Revascularization;  Surgeon: Burnell Blanks, MD;  Location: [place] [facility];  Service: Cardiovascular;  Laterality: N/A;  . EYE SURGERY  2011   Cataract bil  . LEFT HEART CATH AND CORONARY ANGIOGRAPHY N/A 03/24/2021   Procedure: LEFT HEART CATH AND CORONARY ANGIOGRAPHY;  Surgeon: Burnell Blanks, MD;  Location: [place] [facility];  Service: Cardiovascular;  Laterality: N/A;  . PARTIAL KNEE ARTHROPLASTY  2009   right   . Thumb joint     Tendon from arm to replace arthrititic thumb  . TOTAL KNEE ARTHROPLASTY  12/23/2012   Procedure: TOTAL KNEE ARTHROPLASTY;  Surgeon: Ninetta Lights, MD;  Location: [place];  Service: Orthopedics;  Laterality: Left;  . TRACHELECTOMY N/A 04/14/2020   Procedure: LAPAROSCOPIC TRACHELECTOMY;  Surgeon: Schermerhorn, Gwen Her, MD;  Location: [facility] ORS;  Service: Gynecology;  Laterality: N/A;    Current Medications: Current Meds  Medication Sig  . alendronate (FOSAMAX) 70 MG tablet Take 70 mg by mouth every Friday.  Marland Kitchen aspirin 81 MG chewable tablet Chew 1 tablet (81 mg total) by mouth daily.  Marland Kitchen atorvastatin (LIPITOR)  80 MG tablet Take 1 tablet (80 mg total) by mouth daily.  . Carboxymethylcellulose Sodium (THERATEARS OP) Place 1 drop into both eyes daily as needed (when using contacts).  Marland Kitchen dextromethorphan-guaiFENesin (MUCINEX DM) 30-600 MG 12hr tablet Take 1 tablet by mouth 2 (two) times daily as needed for cough.  . Fluticasone-Salmeterol (ADVAIR DISKUS) 250-50 MCG/DOSE AEPB Inhale 1 puff into the lungs in the morning and at  bedtime.  Marland Kitchen ipratropium-albuterol (DUONEB) 0.5-2.5 (3) MG/3ML SOLN Inhale 3 mLs into the lungs 4 (four) times daily.  . metFORMIN (GLUCOPHAGE-XR) 500 MG 24 hr tablet Take 500 mg by mouth 2 (two) times daily.  . metoprolol succinate (TOPROL-XL) 25 MG 24 hr tablet Take 1 tablet (25 mg total) by mouth daily.  . metroNIDAZOLE (METROCREAM) 0.75 % cream Apply topically at bedtime. qhs to face for Rosacea  . Multiple Vitamin (MULTI-VITAMIN) tablet Take 1 tablet by mouth daily.  Marland Kitchen omeprazole (PRILOSEC) 20 MG capsule Take 1 capsule by mouth daily.  . pantoprazole (PROTONIX) 40 MG tablet Take 1 tablet (40 mg total) by mouth daily.  . ticagrelor (BRILINTA) 90 MG TABS tablet Take 1 tablet (90 mg total) by mouth 2 (two) times daily.     Allergies:   Morphine and related   Social History   Socioeconomic History  . Marital status: Married    Spouse name: Not on file  . Number of children: Not on file  . Years of education: Not on file  . Highest education level: Not on file  Occupational History  . Not on file  Tobacco Use  . Smoking status: Former Smoker    Packs/day: 0.50    Years: 45.00    Pack years: 22.50    Types: Cigarettes    Quit date: 03/11/2021    Years since quitting: 0.1  . Smokeless tobacco: Never Used  Vaping Use  . Vaping Use: Some days  . Substances: Nicotine, Flavoring  Substance and Sexual Activity  . Alcohol use: Yes    Comment:  2 glasses of wine a month  . Drug use: No  . Sexual activity: Not on file  Other Topics Concern  . Not on file  Social History Narrative  . Not on file   Social Determinants of Health   Financial Resource Strain: Not on file  Food Insecurity: Not on file  Transportation Needs: Not on file  Physical Activity: Not on file  Stress: Not on file  Social Connections: Not on file     Family History: The patient's family history is negative for Breast cancer.  ROS:   Please see the history of present illness.    All other systems  reviewed and are negative.  EKGs/Labs/Other Studies Reviewed:    The following studies were reviewed today: Cardiac Catheterization 03/24/21 Conclusion  Ost LAD to Prox LAD lesion is 80% stenosed.  A drug-eluting stent was successfully placed using a STENT SYNERGY DES 3X12.  Post intervention, there is a 0% residual stenosis.  1. Severe proximal LAD stenosis best seen in the LAO view. This was confirmed with IVUS imaging. She likely became ischemic from this lesion when she became hypoxic and hypertensive at home.  2. Successful PTCA/DES x 1 proximal LAD 3. No obstructive disease in the left main, Circumflex or RCA 4. Elevated LVEDP  Recommendations: Will admit to the ICU on the critical care team. She will remain intubated while her respiratory status is optimized. Continue Aggrastat drip for 2 hours post PCI. Will continue DAPT with  ASA and Brilinta for at least one year. I will start a high intensity statin. One dose IV Lasix this am. Echo later today.   Coronary Diagrams Diagnostic Dominance: Right    Intervention     _____________  2D Echocardiogram4/09/2021: 1. Left ventricular ejection fraction, by estimation, is 60 to 65%. The  left ventricle has normal function. The left ventricle has no regional  wall motion abnormalities. There is mild concentric left ventricular  hypertrophy. Left ventricular diastolic  parameters were normal.  2. Right ventricular systolic function is normal. The right ventricular  size is normal.  3. The mitral valve is normal in structure. Trivial mitral valve  regurgitation. No evidence of mitral stenosis.  4. The aortic valve is tricuspid. Aortic valve regurgitation is not  visualized. No aortic stenosis is present.  _____________   EKG:  EKG is not ordered today.   Recent Labs: 03/27/2021: Magnesium 2.0 04/05/2021: ALT 41; B Natriuretic Peptide 37.9 04/06/2021: BUN 15; Creatinine, Ser 0.82; Hemoglobin 12.9; Platelets 300;  Potassium 4.7; Sodium 136  Recent Lipid Panel    Component Value Date/Time   CHOL 142 03/26/2021 0455   TRIG 61 03/26/2021 0455   HDL 62 03/26/2021 0455   CHOLHDL 2.3 03/26/2021 0455   VLDL 12 03/26/2021 0455   LDLCALC 68 03/26/2021 0455    Physical Exam:    VS:  BP 120/82 (BP Location: Left Arm, Patient Position: Sitting, Cuff Size: Normal)   Pulse 72   Ht 5\' 1"  (1.549 m)   Wt 141 lb (64 kg)   SpO2 97%   BMI 26.64 kg/m     Wt Readings from Last 3 Encounters:  04/30/21 141 lb (64 kg)  04/05/21 138 lb (62.6 kg)  04/03/21 136 lb (61.7 kg)     GEN:  Well nourished, well developed in no acute distress HEENT: Normal NECK: No JVD; No carotid bruits LYMPHATICS: No lymphadenopathy CARDIAC: RRR, no murmurs, rubs, gallops RESPIRATORY:  Diffusely diminished ABDOMEN: Soft, non-tender, non-distended MUSCULOSKELETAL:  No edema; No deformity  SKIN: Warm and dry NEUROLOGIC:  Alert and oriented x 3 PSYCHIATRIC:  Normal affect   ASSESSMENT:    1. Coronary artery disease of native artery of native heart with stable angina pectoris ([place])   2. Hyperlipidemia LDL goal <70   3. Leukocytosis, unspecified type   4. Chronic diastolic heart failure ([place])   5. Tobacco use    PLAN:    In order of problems listed above:  CAD s/p anterolateral STEMI with PCS/DES to ost/proxLAD Patient underwent recent stenting last month, 03/2021. She is on Aspirin and Brilinta. Has some SOB, but feel COPD and recent hospitalizations are the main contributors for SOB. Can evaluate breathing at follow-u, and if still not at baseline, can change from [place] to Plavix. She has also been referred to cardiac rehab, but not started, will refer again. Continue BB and statin. She has SL Nitro. Labs by PCP.   COPD Recent hospitalization with respiratory failure felt to be from COPD and aspiration from GERD. She has pulmonary and GI appointments this month. Breathing is still a little short with activity, but overall  improving. Can switch Brilinta as above at follow-up if she continues to be short of breath.  HFpEF Echo 03/25/21 showed normal LVEF 60-65% and normal diastolic parameters. Lasix previously discontinued. Euvolemic on exam today.   HTN BP good today. Continue current regimen  HLD LDL 68 03/2021. Continue statin. Can re-check labs at follow-up.   Leukocytosis WBC elevated at  the last visit in the setting of recent steroid use. Patient reports PCP checked labs and they were normal.   Prior tobacco use Quit smoking right before admission for STEMI. Congratulated for cessation.  DM2 A1C 6.9 03/24/21. Followed by PCP  Disposition: Follow up in 3 month(s) with MD/APP    Signed, Philbert Ocallaghan Ninfa Meeker, PA-C  04/30/2021 11:18 AM    [place]

## 2021-04-30 NOTE — Patient Instructions (Addendum)
Medication Instructions:  No changes at this time.  *If you need a refill on your cardiac medications before your next appointment, please call your pharmacy*   Lab Work: None  If you have labs (blood work) drawn today and your tests are completely normal, you will receive your results only by: Marland Kitchen MyChart Message (if you have MyChart) OR . A paper copy in the mail If you have any lab test that is abnormal or we need to change your treatment, we will call you to review the results.   Testing/Procedures: None   Follow-Up: At [facility], you and your health needs are our priority.  As part of our continuing mission to provide you with exceptional heart care, we have created designated Provider Care Teams.  These Care Teams include your primary Cardiologist (physician) and Advanced Practice Providers (APPs -  Physician Assistants and Nurse Practitioners) who all work together to provide you with the care you need, when you need it.   Your next appointment:   3 month(s)  The format for your next appointment:   In Person  Provider:   You may see Nelva Bush, MD or one of the following Advanced Practice Providers on your designated Care Team:    Murray Hodgkins, NP  Christell Faith, PA-C  Marrianne Mood, PA-C  Cadence Kathlen Mody, [place]  Laurann Montana, NP    Other Instructions Referral placed for Cardiac Rehab. They will review chart and insurance then call you in a couple of weeks to get this set up. [employer] [facility]) [phone]

## 2021-05-02 ENCOUNTER — Inpatient Hospital Stay: Payer: Medicare Other | Admitting: Primary Care

## 2021-05-10 ENCOUNTER — Other Ambulatory Visit: Payer: Self-pay

## 2021-05-10 ENCOUNTER — Ambulatory Visit: Payer: Medicare Other | Admitting: Gastroenterology

## 2021-05-10 ENCOUNTER — Encounter: Payer: Self-pay | Admitting: Gastroenterology

## 2021-05-10 VITALS — BP 109/70 | HR 69 | Ht 61.0 in | Wt 143.8 lb

## 2021-05-10 DIAGNOSIS — K115 Sialolithiasis: Secondary | ICD-10-CM

## 2021-05-10 DIAGNOSIS — K219 Gastro-esophageal reflux disease without esophagitis: Secondary | ICD-10-CM | POA: Diagnosis not present

## 2021-05-10 NOTE — Patient Instructions (Signed)
Gastroesophageal Reflux Disease, Adult  Gastroesophageal reflux (GER) happens when acid from the stomach flows up into the tube that connects the mouth and the stomach (esophagus). Normally, food travels down the esophagus and stays in the stomach to be digested. With GER, food and stomach acid sometimes move back up into the esophagus. You may have a disease called gastroesophageal reflux disease (GERD) if the reflux:  Happens often.  Causes frequent or very bad symptoms.  Causes problems such as damage to the esophagus. When this happens, the esophagus becomes sore and swollen. Over time, GERD can make small holes (ulcers) in the lining of the esophagus. What are the causes? This condition is caused by a problem with the muscle between the esophagus and the stomach. When this muscle is weak or not normal, it does not close properly to keep food and acid from coming back up from the stomach. The muscle can be weak because of:  Tobacco use.  Pregnancy.  Having a certain type of hernia (hiatal hernia).  Alcohol use.  Certain foods and drinks, such as coffee, chocolate, onions, and peppermint. What increases the risk?  Being overweight.  Having a disease that affects your connective tissue.  Taking NSAIDs, such a ibuprofen. What are the signs or symptoms?  Heartburn.  Difficult or painful swallowing.  The feeling of having a lump in the throat.  A bitter taste in the mouth.  Bad breath.  Having a lot of saliva.  Having an upset or bloated stomach.  Burping.  Chest pain. Different conditions can cause chest pain. Make sure you see your doctor if you have chest pain.  Shortness of breath or wheezing.  A long-term cough or a cough at night.  Wearing away of the surface of teeth (tooth enamel).  Weight loss. How is this treated?  Making changes to your diet.  Taking medicine.  Having surgery. Treatment will depend on how bad your symptoms are. Follow these  instructions at home: Eating and drinking  Follow a diet as told by your doctor. You may need to avoid foods and drinks such as: ? Coffee and tea, with or without caffeine. ? Drinks that contain alcohol. ? Energy drinks and sports drinks. ? Bubbly (carbonated) drinks or sodas. ? Chocolate and cocoa. ? Peppermint and mint flavorings. ? Garlic and onions. ? Horseradish. ? Spicy and acidic foods. These include peppers, chili powder, curry powder, vinegar, hot sauces, and BBQ sauce. ? Citrus fruit juices and citrus fruits, such as oranges, lemons, and limes. ? Tomato-based foods. These include red sauce, chili, salsa, and pizza with red sauce. ? Fried and fatty foods. These include donuts, french fries, potato chips, and high-fat dressings. ? High-fat meats. These include hot dogs, rib eye steak, sausage, ham, and bacon. ? High-fat dairy items, such as whole milk, butter, and cream cheese.  Eat small meals often. Avoid eating large meals.  Avoid drinking large amounts of liquid with your meals.  Avoid eating meals during the 2-3 hours before bedtime.  Avoid lying down right after you eat.  Do not exercise right after you eat.   Lifestyle  Do not smoke or use any products that contain nicotine or tobacco. If you need help quitting, ask your doctor.  Try to lower your stress. If you need help doing this, ask your doctor.  If you are overweight, lose an amount of weight that is healthy for you. Ask your doctor about a safe weight loss goal.   General instructions  Pay attention to any changes in your symptoms.  Take over-the-counter and prescription medicines only as told by your doctor.  Do not take aspirin, ibuprofen, or other NSAIDs unless your doctor says it is okay.  Wear loose clothes. Do not wear anything tight around your waist.  Raise (elevate) the head of your bed about 6 inches (15 cm). You may need to use a wedge to do this.  Avoid bending over if this makes your  symptoms worse.  Keep all follow-up visits. Contact a doctor if:  You have new symptoms.  You lose weight and you do not know why.  You have trouble swallowing or it hurts to swallow.  You have wheezing or a cough that keeps happening.  You have a hoarse voice.  Your symptoms do not get better with treatment. Get help right away if:  You have sudden pain in your arms, neck, jaw, teeth, or back.  You suddenly feel sweaty, dizzy, or light-headed.  You have chest pain or shortness of breath.  You vomit and the vomit is green, yellow, or black, or it looks like blood or coffee grounds.  You faint.  Your poop (stool) is red, bloody, or black.  You cannot swallow, drink, or eat. These symptoms may represent a serious problem that is an emergency. Do not wait to see if the symptoms will go away. Get medical help right away. Call your local emergency services (911 in the [place] Do not drive yourself to the hospital. Summary  If a person has gastroesophageal reflux disease (GERD), food and stomach acid move back up into the esophagus and cause symptoms or problems such as damage to the esophagus.  Treatment will depend on how bad your symptoms are.  Follow a diet as told by your doctor.  Take all medicines only as told by your doctor. This information is not intended to replace advice given to you by your health care provider. Make sure you discuss any questions you have with your health care provider. Document Revised: 06/12/2020 Document Reviewed: 06/12/2020 Elsevier Patient Education  [place].

## 2021-05-10 NOTE — Progress Notes (Signed)
Jonathon Bellows MD, MRCP(U.K) [address]  [place]  [place], [place] [address]  Main: [phone]  Fax: [phone]   Gastroenterology Consultation  Referring Provider:     [name], MD Primary Care Physician:  [name], MD Primary Gastroenterologist:  Dr. Jonathon Bellows  Reason for Consultation: Acid reflux and dysphagia        HPI:   Elaine Wright is a 65 y.o. y/o female referred for consultation & management  by Dr. [name], MD.    She was recently admitted and discharged in April 2022 from the hospital with respiratory failure with hypoxia after an episode of acid reflux.  In the hospital showed diagnosed with COPD exacerbation and placed on for steroids and treated with azithromycin.  They are concerned that she may be aspirating.  There was some concerns for dysphagia.  There was no oropharyngeal dysphagia based on modified barium swallow.  She had a barium swallow as well which showed no stricture.  CT scan of the neck on 03/21/2021 showed no abnormalities.  Was advised head elevation placed on Protonix and advised to see [place] as an outpatient for possible EGD.  She was seen by her cardiologist on 04/30/2021 reviewed the note suggest that she had a cardiac cath on 03/24/2021 after a code STEMI and had LAD stenosis of 80% and treated with a stent.  Since her discharge she states that she has had no episodes of regurgitation, dysphagia, burning sensation in her throat or any GI symptoms whatsoever.  She has been taking omeprazole once daily after her breakfast.  I instructed her to take it before her breakfast.  Past Medical History:  Diagnosis Date  . Arthritis   . Diabetes mellitus without complication ([place])   . GERD (gastroesophageal reflux disease)   . Sleep apnea     Past Surgical History:  Procedure Laterality Date  . ABDOMINAL HYSTERECTOMY  2003  . ANTERIOR AND POSTERIOR REPAIR N/A 04/14/2020   Procedure: ANTERIOR (CYSTOCELE) AND POSTERIOR REPAIR  (RECTOCELE);  Surgeon: Schermerhorn, Gwen Her, MD;  Location: [facility];  Service: Gynecology;  Laterality: N/A;  . CORONARY/GRAFT ACUTE MI REVASCULARIZATION N/A 03/24/2021   Procedure: Coronary/Graft Acute MI Revascularization;  Surgeon: Burnell Blanks, MD;  Location: [facility];  Service: Cardiovascular;  Laterality: N/A;  . EYE SURGERY  2011   Cataract bil  . LEFT HEART CATH AND CORONARY ANGIOGRAPHY N/A 03/24/2021   Procedure: LEFT HEART CATH AND CORONARY ANGIOGRAPHY;  Surgeon: Burnell Blanks, MD;  Location: [facility];  Service: Cardiovascular;  Laterality: N/A;  . PARTIAL KNEE ARTHROPLASTY  2009   right   . Thumb joint     Tendon from arm to replace arthrititic thumb  . TOTAL KNEE ARTHROPLASTY  12/23/2012   Procedure: TOTAL KNEE ARTHROPLASTY;  Surgeon: Ninetta Lights, MD;  Location: [facility];  Service: Orthopedics;  Laterality: Left;  . TRACHELECTOMY N/A 04/14/2020   Procedure: LAPAROSCOPIC TRACHELECTOMY;  Surgeon: Schermerhorn, Gwen Her, MD;  Location: [facility];  Service: Gynecology;  Laterality: N/A;    Prior to Admission medications   Medication Sig Start Date End Date Taking? Authorizing Provider  alendronate (FOSAMAX) 70 MG tablet Take 70 mg by mouth every Friday. 02/07/21   [provider]  aspirin 81 MG chewable tablet Chew 1 tablet (81 mg total) by mouth daily. 03/28/21   Sidney Ace, MD  atorvastatin (LIPITOR) 80 MG tablet Take 1 tablet (80 mg total) by mouth daily. 04/03/21 09/30/21  Loel Dubonnet, NP  Carboxymethylcellulose Sodium (THERATEARS OP) Place 1 drop into both eyes daily as needed (when using contacts).    [provider]  dextromethorphan-guaiFENesin (MUCINEX DM) 30-600 MG 12hr tablet Take 1 tablet by mouth 2 (two) times daily as needed for cough.    [provider]  Fluticasone-Salmeterol (ADVAIR DISKUS) 250-50 MCG/DOSE AEPB Inhale 1 puff into the lungs in the morning and at bedtime. 04/07/21 05/07/21   Sharen Hones, MD  ipratropium-albuterol (DUONEB) 0.5-2.5 (3) MG/3ML SOLN Inhale 3 mLs into the lungs 4 (four) times daily. 03/22/21   [provider]  metFORMIN (GLUCOPHAGE-XR) 500 MG 24 hr tablet Take 500 mg by mouth 2 (two) times daily. 03/04/20   [provider]  metoprolol succinate (TOPROL-XL) 25 MG 24 hr tablet Take 1 tablet (25 mg total) by mouth daily. 04/03/21 09/30/21  Loel Dubonnet, NP  metroNIDAZOLE (METROCREAM) 0.75 % cream Apply topically at bedtime. qhs to face for Rosacea 02/05/21 02/05/22  Brendolyn Patty, MD  Multiple Vitamin (MULTI-VITAMIN) tablet Take 1 tablet by mouth daily.    [provider]  omeprazole (PRILOSEC) 20 MG capsule Take 1 capsule by mouth daily. 04/24/21   [provider]  pantoprazole (PROTONIX) 40 MG tablet Take 1 tablet (40 mg total) by mouth daily. 04/08/21   Sharen Hones, MD  ticagrelor (BRILINTA) 90 MG TABS tablet Take 1 tablet (90 mg total) by mouth 2 (two) times daily. 04/03/21 03/29/22  Loel Dubonnet, NP    Family History  Problem Relation Age of Onset  . Breast cancer Neg Hx      Social History   Tobacco Use  . Smoking status: Former Smoker    Packs/day: 0.50    Years: 45.00    Pack years: 22.50    Types: Cigarettes    Quit date: 03/11/2021    Years since quitting: 0.1  . Smokeless tobacco: Never Used  Vaping Use  . Vaping Use: Some days  . Substances: Nicotine, Flavoring  Substance Use Topics  . Alcohol use: Yes    Comment:  2 glasses of wine a month  . Drug use: No    Allergies as of 05/10/2021 - Review Complete 05/10/2021  Allergen Reaction Noted  . Morphine and related Other (See Comments) 12/17/2012    Review of Systems:    All systems reviewed and negative except where noted in HPI.   Physical Exam:  BP 109/70 (BP Location: Left Arm, Patient Position: Sitting, Cuff Size: Normal)   Pulse 69   Ht 5\' 1"  (1.549 m)   Wt 143 lb 12.8 oz (65.2 kg)   BMI 27.17 kg/m  No LMP recorded. Patient  has had a hysterectomy. Psych:  Alert and cooperative. Normal mood and affect. General:   Alert,  Well-developed, well-nourished, pleasant and cooperative in NAD Head:  Normocephalic and atraumatic. Eyes:  Sclera clear, no icterus.   Conjunctiva pink. Ears:  Normal auditory acuity. Lungs:  Respirations even and unlabored.  Clear throughout to auscultation.   No wheezes, crackles, or rhonchi. No acute distress. Heart:  Regular rate and rhythm; no murmurs, clicks, rubs, or gallops. Abdomen:  Normal bowel sounds.  No bruits.  Soft, non-tender and non-distended without masses, hepatosplenomegaly or hernias noted.  No guarding or rebound tenderness.    Neurologic:  Alert and oriented x3;  grossly normal neurologically. Psych:  Alert and cooperative. Normal mood and affect.  Imaging Studies: No results found.  Assessment and Plan:   Elaine Wright is a  65 y.o. y/o female has been referred for dysphagia and GERD.  She recently was admitted to the hospital with a COPD exacerbation, respiratory failure and at portion of that was attributed to acid reflux causing aspiration.  She had a barium swallow which showed no stricture or blockage.  Commenced on a PPI and since discharge she has had no recurrence of symptoms.  All symptoms have resolved including dysphagia.  She also had a recent NSTEMI and had a drug-eluting stent placed.  This was in April 2022.  Plan 1. GERD : Counseled on life style changes, suggest to use PPI first thing in the morning on empty stomach and eat 30 minutes after. Advised on the use of a wedge pillow at night , avoid meals for 2 hours prior to bed time.  I would suggest long-term continuation of Prilosec 40 mg once a day.  The risks of taking her off the medication are significant in terms of possible aspiration and hospitalization hence the benefits exceed the risks.  In addition she is on a blood thinner at this point of time and would help reduce the chances of GI bleeds.  I  have advised her to take the omeprazole first thing in the morning on an empty stomach and eat 30 minutes after.  Since she is having no GI symptoms at this point of time I do not see any indication to perform an endoscopy.  I have clearly explained to her that if she were to develop symptoms to give me a call and we will reevaluate her.  2.  She complains of pain while swallowing the area of her salivary glands and was noted to have a stone in her salivary gland recently on imaging.  She would like referral to an ENT  Follow up as needed  Dr Jonathon Bellows MD,MRCP(U.K)

## 2021-05-12 ENCOUNTER — Inpatient Hospital Stay
Admission: EM | Admit: 2021-05-12 | Discharge: 2021-05-14 | DRG: 190 | Disposition: A | Discharge status: Home / Self Care | Payer: Medicare Other | Attending: Internal Medicine | Admitting: Internal Medicine

## 2021-05-12 ENCOUNTER — Emergency Department: Payer: Medicare Other

## 2021-05-12 ENCOUNTER — Other Ambulatory Visit: Payer: Self-pay

## 2021-05-12 ENCOUNTER — Encounter: Payer: Self-pay | Admitting: Intensive Care

## 2021-05-12 DIAGNOSIS — Z7983 Long term (current) use of bisphosphonates: Secondary | ICD-10-CM | POA: Diagnosis not present

## 2021-05-12 DIAGNOSIS — I252 Old myocardial infarction: Secondary | ICD-10-CM

## 2021-05-12 DIAGNOSIS — G4733 Obstructive sleep apnea (adult) (pediatric): Secondary | ICD-10-CM | POA: Diagnosis present

## 2021-05-12 DIAGNOSIS — J9601 Acute respiratory failure with hypoxia: Secondary | ICD-10-CM

## 2021-05-12 DIAGNOSIS — E1169 Type 2 diabetes mellitus with other specified complication: Secondary | ICD-10-CM | POA: Diagnosis present

## 2021-05-12 DIAGNOSIS — Z87891 Personal history of nicotine dependence: Secondary | ICD-10-CM | POA: Diagnosis not present

## 2021-05-12 DIAGNOSIS — Z7982 Long term (current) use of aspirin: Secondary | ICD-10-CM

## 2021-05-12 DIAGNOSIS — M810 Age-related osteoporosis without current pathological fracture: Secondary | ICD-10-CM | POA: Diagnosis present

## 2021-05-12 DIAGNOSIS — Z7984 Long term (current) use of oral hypoglycemic drugs: Secondary | ICD-10-CM | POA: Diagnosis not present

## 2021-05-12 DIAGNOSIS — Z7902 Long term (current) use of antithrombotics/antiplatelets: Secondary | ICD-10-CM

## 2021-05-12 DIAGNOSIS — Z955 Presence of coronary angioplasty implant and graft: Secondary | ICD-10-CM | POA: Diagnosis not present

## 2021-05-12 DIAGNOSIS — I2510 Atherosclerotic heart disease of native coronary artery without angina pectoris: Secondary | ICD-10-CM | POA: Diagnosis present

## 2021-05-12 DIAGNOSIS — Z20822 Contact with and (suspected) exposure to covid-19: Secondary | ICD-10-CM | POA: Diagnosis present

## 2021-05-12 DIAGNOSIS — Z7951 Long term (current) use of inhaled steroids: Secondary | ICD-10-CM | POA: Diagnosis not present

## 2021-05-12 DIAGNOSIS — J441 Chronic obstructive pulmonary disease with (acute) exacerbation: Principal | ICD-10-CM | POA: Diagnosis present

## 2021-05-12 DIAGNOSIS — E785 Hyperlipidemia, unspecified: Secondary | ICD-10-CM

## 2021-05-12 DIAGNOSIS — Z96652 Presence of left artificial knee joint: Secondary | ICD-10-CM | POA: Diagnosis present

## 2021-05-12 DIAGNOSIS — K219 Gastro-esophageal reflux disease without esophagitis: Secondary | ICD-10-CM | POA: Diagnosis present

## 2021-05-12 DIAGNOSIS — E119 Type 2 diabetes mellitus without complications: Secondary | ICD-10-CM

## 2021-05-12 LAB — TROPONIN I (HIGH SENSITIVITY)
Troponin I (High Sensitivity): 7 ng/L (ref <18)
Troponin I (High Sensitivity): 7 ng/L (ref <18)

## 2021-05-12 LAB — CBC
HCT: 40.4 % (ref 36.0–46.0)
Hemoglobin: 13.2 g/dL (ref 12.0–15.0)
MCH: 28.6 pg (ref 26.0–34.0)
MCHC: 32.7 g/dL (ref 30.0–36.0)
MCV: 87.4 fL (ref 80.0–100.0)
Platelets: 278 10*3/uL (ref 150–400)
RBC: 4.62 MIL/uL (ref 3.87–5.11)
RDW: 15.1 % (ref 11.5–15.5)
WBC: 13.3 10*3/uL — ABNORMAL HIGH (ref 4.0–10.5)
nRBC: 0 % (ref 0.0–0.2)

## 2021-05-12 LAB — BASIC METABOLIC PANEL
Anion gap: 8 (ref 5–15)
BUN: 14 mg/dL (ref 8–23)
CO2: 26 mmol/L (ref 22–32)
Calcium: 9 mg/dL (ref 8.9–10.3)
Chloride: 106 mmol/L (ref 98–111)
Creatinine, Ser: 0.56 mg/dL (ref 0.44–1.00)
GFR, Estimated: >60 mL/min (ref >60)
Glucose, Bld: 163 mg/dL — ABNORMAL HIGH (ref 70–99)
Potassium: 4 mmol/L (ref 3.5–5.1)
Sodium: 140 mmol/L (ref 135–145)

## 2021-05-12 LAB — PROCALCITONIN: Procalcitonin: <0.1 ng/mL

## 2021-05-12 LAB — RESP PANEL BY RT-PCR (FLU A&B, COVID) ARPGX2
Influenza A by PCR: NEGATIVE
Influenza B by PCR: NEGATIVE
SARS Coronavirus 2 by RT PCR: NEGATIVE

## 2021-05-12 LAB — BRAIN NATRIURETIC PEPTIDE: B Natriuretic Peptide: 12.3 pg/mL (ref 0.0–100.0)

## 2021-05-12 MED ORDER — SODIUM CHLORIDE 0.9 % IV SOLN
1.0000 g | Freq: Once | INTRAVENOUS | Status: DC
  Administered 2021-05-12: 1 g via INTRAVENOUS
  Filled 2021-05-12: qty 10

## 2021-05-12 MED ORDER — AZITHROMYCIN 500 MG PO TABS
500.0000 mg | ORAL_TABLET | Freq: Every day | ORAL | Status: DC
  Administered 2021-05-13: 500 mg via ORAL
  Filled 2021-05-12 (×2): qty 1

## 2021-05-12 MED ORDER — IPRATROPIUM-ALBUTEROL 0.5-2.5 (3) MG/3ML IN SOLN: 3.0000 mL | Freq: Four times a day (QID) | RESPIRATORY_TRACT | Status: DC

## 2021-05-12 MED ORDER — TRAZODONE HCL 50 MG PO TABS: 25.0000 mg | ORAL_TABLET | Freq: Every evening | ORAL | Status: DC | PRN

## 2021-05-12 MED ORDER — ENOXAPARIN SODIUM 40 MG/0.4ML IJ SOSY
40.0000 mg | PREFILLED_SYRINGE | INTRAMUSCULAR | Status: DC
  Administered 2021-05-13 (×2): 40 mg via SUBCUTANEOUS
  Filled 2021-05-12 (×2): qty 0.4

## 2021-05-12 MED ORDER — IPRATROPIUM-ALBUTEROL 0.5-2.5 (3) MG/3ML IN SOLN
RESPIRATORY_TRACT | Status: AC
  Administered 2021-05-12: 3 mL via RESPIRATORY_TRACT
  Filled 2021-05-12: qty 6

## 2021-05-12 MED ORDER — IPRATROPIUM-ALBUTEROL 0.5-2.5 (3) MG/3ML IN SOLN: 9.0000 mL | Freq: Once | RESPIRATORY_TRACT | Status: AC

## 2021-05-12 MED ORDER — ONDANSETRON HCL 4 MG/2ML IJ SOLN: 4.0000 mg | Freq: Four times a day (QID) | INTRAMUSCULAR | Status: DC | PRN

## 2021-05-12 MED ORDER — ACETAMINOPHEN 325 MG PO TABS: 650.0000 mg | ORAL_TABLET | Freq: Four times a day (QID) | ORAL | Status: DC | PRN

## 2021-05-12 MED ORDER — ADULT MULTIVITAMIN W/MINERALS CH
1.0000 | ORAL_TABLET | Freq: Every day | ORAL | Status: DC
  Administered 2021-05-13 – 2021-05-14 (×2): 1 via ORAL
  Filled 2021-05-12 (×2): qty 1

## 2021-05-12 MED ORDER — PREDNISONE 20 MG PO TABS
40.0000 mg | ORAL_TABLET | Freq: Every day | ORAL | Status: DC
  Administered 2021-05-13: 40 mg via ORAL

## 2021-05-12 MED ORDER — POLYETHYLENE GLYCOL 3350 17 G PO PACK: 17.0000 g | PACK | Freq: Every day | ORAL | Status: DC | PRN

## 2021-05-12 MED ORDER — ASPIRIN 81 MG PO CHEW
81.0000 mg | CHEWABLE_TABLET | Freq: Every day | ORAL | Status: DC
  Administered 2021-05-13 – 2021-05-14 (×2): 81 mg via ORAL
  Filled 2021-05-12 (×2): qty 1

## 2021-05-12 MED ORDER — INSULIN ASPART 100 UNIT/ML IJ SOLN
0.0000 [IU] | Freq: Three times a day (TID) | INTRAMUSCULAR | Status: DC
  Administered 2021-05-13: 2 [IU] via SUBCUTANEOUS

## 2021-05-12 MED ORDER — PANTOPRAZOLE SODIUM 40 MG PO TBEC
40.0000 mg | DELAYED_RELEASE_TABLET | Freq: Every day | ORAL | Status: DC
  Administered 2021-05-13 – 2021-05-14 (×2): 40 mg via ORAL
  Filled 2021-05-12 (×2): qty 1

## 2021-05-12 MED ORDER — ACETAMINOPHEN 650 MG RE SUPP: 650.0000 mg | Freq: Four times a day (QID) | RECTAL | Status: DC | PRN

## 2021-05-12 MED ORDER — ONDANSETRON HCL 4 MG PO TABS: 4.0000 mg | ORAL_TABLET | Freq: Four times a day (QID) | ORAL | Status: DC | PRN

## 2021-05-12 MED ORDER — METHYLPREDNISOLONE SODIUM SUCC 125 MG IJ SOLR
125.0000 mg | Freq: Once | INTRAMUSCULAR | Status: AC
  Administered 2021-05-12: 125 mg via INTRAVENOUS
  Filled 2021-05-12: qty 2

## 2021-05-12 MED ORDER — METFORMIN HCL ER 500 MG PO TB24
500.0000 mg | ORAL_TABLET | Freq: Two times a day (BID) | ORAL | Status: DC
  Filled 2021-05-12: qty 1

## 2021-05-12 MED ORDER — METOPROLOL SUCCINATE ER 50 MG PO TB24
25.0000 mg | ORAL_TABLET | Freq: Every day | ORAL | Status: DC
  Administered 2021-05-13 – 2021-05-14 (×2): 25 mg via ORAL
  Filled 2021-05-12 (×2): qty 1

## 2021-05-12 MED ORDER — TICAGRELOR 90 MG PO TABS
90.0000 mg | ORAL_TABLET | Freq: Two times a day (BID) | ORAL | Status: DC
  Administered 2021-05-13 – 2021-05-14 (×3): 90 mg via ORAL
  Filled 2021-05-12 (×4): qty 1

## 2021-05-12 MED ORDER — ATORVASTATIN CALCIUM 20 MG PO TABS
80.0000 mg | ORAL_TABLET | Freq: Every day | ORAL | Status: DC
  Administered 2021-05-13 – 2021-05-14 (×2): 80 mg via ORAL
  Filled 2021-05-12 (×2): qty 4

## 2021-05-12 MED ORDER — ALBUTEROL SULFATE (2.5 MG/3ML) 0.083% IN NEBU
INHALATION_SOLUTION | RESPIRATORY_TRACT | Status: AC
  Filled 2021-05-12: qty 6

## 2021-05-12 MED ORDER — SODIUM CHLORIDE 0.9 % IV SOLN
500.0000 mg | Freq: Once | INTRAVENOUS | Status: DC
  Filled 2021-05-12: qty 500

## 2021-05-12 MED ORDER — OXYCODONE HCL 5 MG PO TABS: 5.0000 mg | ORAL_TABLET | ORAL | Status: DC | PRN

## 2021-05-12 MED ORDER — IPRATROPIUM-ALBUTEROL 0.5-2.5 (3) MG/3ML IN SOLN
RESPIRATORY_TRACT | Status: AC
  Administered 2021-05-12: 9 mL via RESPIRATORY_TRACT
  Filled 2021-05-12: qty 9

## 2021-05-12 MED ORDER — SODIUM CHLORIDE 0.9 % IV SOLN
500.0000 mg | INTRAVENOUS | Status: AC
  Administered 2021-05-12: 500 mg via INTRAVENOUS

## 2021-05-12 NOTE — H&P (Signed)
[facility] History and Physical  Elaine Wright YKD:[id number] DOB: [birth year] DOA: 05/12/2021  Referring physician: Dr. Tamala Julian PCP: [name], MD   Chief Complaint: Shortness of breath  HPI: Elaine Wright is a 65 y.o. female with history of CAD status post LAD stent last month, hyperlipidemia, DM2, COPD, who presents with shortness of breath.  Chart review notable for CT lung obtained in February 2022 for cancer screening due to long history of smoking (patient no longer smokes) which was negative for any acute findings but did show emphysematous changes.  Patient was admitted on April 9 for both acute STEMI for which she received a proximal LAD DES as well as a COPD exacerbation.  She was again admitted to [facility] from April 21 to 23 for COPD exacerbation.  Today patient reports that she has been doing well since her last hospitalization until today when she developed significant cough and shortness of breath.  She endorses having a runny nose but otherwise denies chest pain, fever, abdominal pain.  She reports that she takes all her medicines as prescribed without any missed doses.  She has not been around any sick contacts.  She reports that she took Advair for a few days after her last hospitalization but discontinued it shortly thereafter.  Her husband is at bedside, both of them express confusion around what her diagnosis is and why she continues to have respiratory issues.  In the ED initial vital signs notable for decent saturations in the low 90s but increased work of breathing with respiratory rate as high as 30.  She was initially placed on 2 L nasal cannula but due to increased work of breathing was placed on BiPAP with good improvement in her symptoms.  Lab work-up notable for unremarkable BMP and CBC, normal BNP and negative serial troponin, negative respiratory panel, and unremarkable chest x-ray.  EKG showed no acute ischemic changes.  She was treated with  methylprednisone, azithromycin, nebulizers, and admitted for further management.  Review of Systems:  Pertinent positives and negative per HPI, all others reviewed and negative  Past Medical History:  Diagnosis Date  . Arthritis   . Diabetes mellitus without complication ([place])   . GERD (gastroesophageal reflux disease)   . Sleep apnea    Past Surgical History:  Procedure Laterality Date  . ABDOMINAL HYSTERECTOMY  2003  . ANTERIOR AND POSTERIOR REPAIR N/A 04/14/2020   Procedure: ANTERIOR (CYSTOCELE) AND POSTERIOR REPAIR (RECTOCELE);  Surgeon: Schermerhorn, Gwen Her, MD;  Location: [facility];  Service: Gynecology;  Laterality: N/A;  . CORONARY/GRAFT ACUTE MI REVASCULARIZATION N/A 03/24/2021   Procedure: Coronary/Graft Acute MI Revascularization;  Surgeon: Burnell Blanks, MD;  Location: [facility];  Service: Cardiovascular;  Laterality: N/A;  . EYE SURGERY  2011   Cataract bil  . LEFT HEART CATH AND CORONARY ANGIOGRAPHY N/A 03/24/2021   Procedure: LEFT HEART CATH AND CORONARY ANGIOGRAPHY;  Surgeon: Burnell Blanks, MD;  Location: [facility];  Service: Cardiovascular;  Laterality: N/A;  . PARTIAL KNEE ARTHROPLASTY  2009   right   . Thumb joint     Tendon from arm to replace arthrititic thumb  . TOTAL KNEE ARTHROPLASTY  12/23/2012   Procedure: TOTAL KNEE ARTHROPLASTY;  Surgeon: Ninetta Lights, MD;  Location: [facility];  Service: Orthopedics;  Laterality: Left;  . TRACHELECTOMY N/A 04/14/2020   Procedure: LAPAROSCOPIC TRACHELECTOMY;  Surgeon: Schermerhorn, Gwen Her, MD;  Location: [facility];  Service: Gynecology;  Laterality: N/A;   Social History:  reports that she quit smoking about 2 months ago. Her smoking use included cigarettes. She has a 22.50 pack-year smoking history. She has never used smokeless tobacco. She reports current alcohol use of about 1.0 standard drink of alcohol per week. She reports that she does not use drugs.  Allergies  Allergen Reactions   . Morphine And Related Other (See Comments)    Confusion, hallucinations    Family History  Problem Relation Age of Onset  . Breast cancer Neg Hx      Prior to Admission medications   Medication Sig Start Date End Date Taking? Authorizing Provider  alendronate (FOSAMAX) 70 MG tablet Take 70 mg by mouth every Friday. 02/07/21   [provider]  aspirin 81 MG chewable tablet Chew 1 tablet (81 mg total) by mouth daily. 03/28/21   Sidney Ace, MD  atorvastatin (LIPITOR) 80 MG tablet Take 1 tablet (80 mg total) by mouth daily. 04/03/21 09/30/21  Loel Dubonnet, NP  Carboxymethylcellulose Sodium (THERATEARS OP) Place 1 drop into both eyes daily as needed (when using contacts).    [provider]  dextromethorphan-guaiFENesin (MUCINEX DM) 30-600 MG 12hr tablet Take 1 tablet by mouth 2 (two) times daily as needed for cough.    [provider]  Fluticasone-Salmeterol (ADVAIR DISKUS) 250-50 MCG/DOSE AEPB Inhale 1 puff into the lungs in the morning and at bedtime. 04/07/21 05/07/21  Sharen Hones, MD  ipratropium-albuterol (DUONEB) 0.5-2.5 (3) MG/3ML SOLN Inhale 3 mLs into the lungs 4 (four) times daily. 03/22/21   [provider]  metFORMIN (GLUCOPHAGE-XR) 500 MG 24 hr tablet Take 500 mg by mouth 2 (two) times daily. 03/04/20   [provider]  metoprolol succinate (TOPROL-XL) 25 MG 24 hr tablet Take 1 tablet (25 mg total) by mouth daily. 04/03/21 09/30/21  Loel Dubonnet, NP  metroNIDAZOLE (METROCREAM) 0.75 % cream Apply topically at bedtime. qhs to face for Rosacea 02/05/21 02/05/22  Brendolyn Patty, MD  Multiple Vitamin (MULTI-VITAMIN) tablet Take 1 tablet by mouth daily.    [provider]  omeprazole (PRILOSEC) 20 MG capsule Take 1 capsule by mouth daily. 04/24/21   [provider]  pantoprazole (PROTONIX) 40 MG tablet Take 1 tablet (40 mg total) by mouth daily. 04/08/21   Sharen Hones, MD  ticagrelor (BRILINTA) 90 MG TABS tablet Take  1 tablet (90 mg total) by mouth 2 (two) times daily. 04/03/21 03/29/22  Loel Dubonnet, NP   Physical Exam: Vitals:   05/12/21 1930 05/12/21 2010 05/12/21 2030 05/12/21 2100  BP: (!) 152/92  125/83 127/81  Pulse: 73  81 84  Resp: 19  20 16   Temp:      TempSrc:      SpO2: 98% 99% 99% 98%  Weight:      Height:        Wt Readings from Last 3 Encounters:  05/12/21 65.2 kg  05/10/21 65.2 kg  04/30/21 64 kg     . General:  Sitting up on bipap, frequently readjusting mask . Eyes: PERRL, normal lids, irises & conjunctiva . ENT: grossly normal hearing, lips & tongue . Neck: no masses . Cardiovascular: RRR, no m/r/g. No LE edema. . Telemetry: SR, no arrhythmias  . Respiratory: slightly increased work of breath with mild tachypnea. Able to speak in complete sentences during interview. Moderate to poor air movement throughout with diffuse expiratory wheezes. . Abdomen: soft, ntnd . Skin: no rash or induration seen on limited exam . Musculoskeletal: grossly normal tone BUE/BLE .  Psychiatric: grossly normal mood and affect, speech fluent and appropriate . Neurologic: grossly non-focal.          Labs on Admission:  Basic Metabolic Panel: Recent Labs  Lab 05/12/21 1756  NA 140  K 4.0  CL 106  CO2 26  GLUCOSE 163*  BUN 14  CREATININE 0.56  CALCIUM 9.0   Liver Function Tests: No results for input(s): AST, ALT, ALKPHOS, BILITOT, PROT, ALBUMIN in the last 168 hours. No results for input(s): LIPASE, AMYLASE in the last 168 hours. No results for input(s): AMMONIA in the last 168 hours. CBC: Recent Labs  Lab 05/12/21 1756  WBC 13.3*  HGB 13.2  HCT 40.4  MCV 87.4  PLT 278   Cardiac Enzymes: No results for input(s): CKTOTAL, CKMB, CKMBINDEX, TROPONINI in the last 168 hours.  BNP (last 3 results) Recent Labs    03/25/21 0408 04/05/21 0825 05/12/21 1756  BNP 620.9* 37.9 12.3    ProBNP (last 3 results) No results for input(s): PROBNP in the last 8760  hours.  CBG: No results for input(s): GLUCAP in the last 168 hours.  Radiological Exams on Admission: DG Chest 2 View  Result Date: 05/12/2021 CLINICAL DATA:  Cough and shortness of breath. EXAM: CHEST - 2 VIEW COMPARISON:  Chest CT February 07, 2021 FINDINGS: The heart size and mediastinal contours are within normal limits. Both lungs are clear. The visualized skeletal structures are unremarkable. IMPRESSION: No active cardiopulmonary disease. Electronically Signed   By: Fidela Salisbury M.D.   On: 05/12/2021 18:32    EKG: Independently reviewed.  Normal sinus rhythm, T wave inversion in aVL and V2, no ST segment changes.  Compared to prior lateral T wave inversions and T wave inversion in lead I have resolved.  Assessment/Plan Active Problems:   CAD (coronary artery disease)   Diabetes mellitus without complication (HCC)   HLD (hyperlipidemia)   COPD exacerbation (HCC)   Elaine Wright is a 65 y.o. female with history of CAD status post LAD stent last month, hyperlipidemia, DM2, COPD, who presents with shortness of breath and found to be in a COPD exacerbation.  #COPD Exacerbation #Acute hypoxemic respiratory failure Diffuse wheezing on exam and patient has not been taking her controller inhaler consistent with COPD exacerbation.  No suspicion for cardiac wheeze with normal BNP, negative troponins, no EKG changes or chest pain.  Long conversation had with patient and her husband explaining etiology and pathophysiology of COPD, need to take controller meds to avoid future exacerbations, and need for outpatient follow-up with pulmonology. - Azithromycin x5 days - Prednisone x5 days - Anticipate patient will be able to wean off BiPAP very quickly, wean O2 as tolerated - DuoNebs every 6 hours  #Known medical problems Osteoporosis-hold alendronate CAD- continue aspirin, atorvastatin, metoprolol, ticagrelor DM2- continue metformin, 4 times daily fingersticks with sliding scale  correction GERD-continue PPI  Code Status: Full Code DVT Prophylaxis: lovenox Family Communication: husband updated at bedside Disposition Plan: Inpatient, Med Surg   Time spent: 41 min  Clarnce Flock MD/[facility]  Note:  This document was prepared using Systems analyst and may include unintentional dictation errors.

## 2021-05-12 NOTE — ED Provider Notes (Signed)
[facility] Emergency Department Provider Note  ____________________________________________   Event Date/Time   First MD Initiated Contact with Patient 05/12/21 1912     (approximate)  I have reviewed the triage vital signs and the nursing notes.   HISTORY  Chief Complaint Shortness of Breath   HPI Elaine Wright is a 65 y.o. female with a past medical history of CAD, STEMI (s/p of PCI/DES), HLD, DM, former smoker, OSA on CPAP, GERD, and recent admission for acute hypoxic respiratory failure secondary to COPD exacerbation thought to be secondary to reflux and aspiration from GERD who presents for assessment of acute onset of shortness of breath and nonproductive cough that started around 130 today.  Patient states she tried nebulizer treatment but this did not seem to help.  She does not have any associated chest pain, abdominal pain, back pain but does endorse mild headache.  She has not had any vomiting or burning in her chest and does not think she has had any reflux today.  She denies any diarrhea, urinary symptoms, rash or injuries.  No vertigo, vision changes or any other clear acute sick symptoms.  She is not currently on any steroids.  She denies any other acute concerns at this time.  She has not had a chance yet to follow-up with pulmonologist since her last admission.         Past Medical History:  Diagnosis Date  . Arthritis   . Diabetes mellitus without complication ([place])   . GERD (gastroesophageal reflux disease)   . Sleep apnea     Patient Active Problem List   Diagnosis Date Noted  . Acute respiratory failure with hypoxia ([place]) 04/05/2021  . CAD (coronary artery disease) 04/05/2021  . COPD exacerbation ([place]) 04/05/2021  . SOB (shortness of breath) 04/05/2021  . Diabetes mellitus without complication ([place])   . Sleep apnea   . HLD (hyperlipidemia)   . Elevated troponin   . GERD (gastroesophageal reflux disease)   . STEMI (ST  elevation myocardial infarction) ([place]) 03/24/2021  . Acute ST elevation myocardial infarction (STEMI) of anterior wall ([place]) 03/24/2021  . Acute ST elevation myocardial infarction (STEMI) involving left anterior descending (LAD) coronary artery [facility])     Past Surgical History:  Procedure Laterality Date  . ABDOMINAL HYSTERECTOMY  2003  . ANTERIOR AND POSTERIOR REPAIR N/A 04/14/2020   Procedure: ANTERIOR (CYSTOCELE) AND POSTERIOR REPAIR (RECTOCELE);  Surgeon: Schermerhorn, Gwen Her, MD;  Location: [facility] ORS;  Service: Gynecology;  Laterality: N/A;  . CORONARY/GRAFT ACUTE MI REVASCULARIZATION N/A 03/24/2021   Procedure: Coronary/Graft Acute MI Revascularization;  Surgeon: Burnell Blanks, MD;  Location: [place] CV LAB;  Service: Cardiovascular;  Laterality: N/A;  . EYE SURGERY  2011   Cataract bil  . LEFT HEART CATH AND CORONARY ANGIOGRAPHY N/A 03/24/2021   Procedure: LEFT HEART CATH AND CORONARY ANGIOGRAPHY;  Surgeon: Burnell Blanks, MD;  Location: [place] CV LAB;  Service: Cardiovascular;  Laterality: N/A;  . PARTIAL KNEE ARTHROPLASTY  2009   right   . Thumb joint     Tendon from arm to replace arthrititic thumb  . TOTAL KNEE ARTHROPLASTY  12/23/2012   Procedure: TOTAL KNEE ARTHROPLASTY;  Surgeon: Ninetta Lights, MD;  Location: [place];  Service: Orthopedics;  Laterality: Left;  . TRACHELECTOMY N/A 04/14/2020   Procedure: LAPAROSCOPIC TRACHELECTOMY;  Surgeon: Schermerhorn, Gwen Her, MD;  Location: [facility] ORS;  Service: Gynecology;  Laterality: N/A;    Prior to Admission medications  Medication Sig Start Date End Date Taking? Authorizing Provider  alendronate (FOSAMAX) 70 MG tablet Take 70 mg by mouth every Friday. 02/07/21   [provider]  aspirin 81 MG chewable tablet Chew 1 tablet (81 mg total) by mouth daily. 03/28/21   Sidney Ace, MD  atorvastatin (LIPITOR) 80 MG tablet Take 1 tablet (80 mg total) by mouth daily. 04/03/21 09/30/21  Loel Dubonnet, NP  Carboxymethylcellulose Sodium (THERATEARS OP) Place 1 drop into both eyes daily as needed (when using contacts).    [provider]  dextromethorphan-guaiFENesin (MUCINEX DM) 30-600 MG 12hr tablet Take 1 tablet by mouth 2 (two) times daily as needed for cough.    [provider]  Fluticasone-Salmeterol (ADVAIR DISKUS) 250-50 MCG/DOSE AEPB Inhale 1 puff into the lungs in the morning and at bedtime. 04/07/21 05/07/21  Sharen Hones, MD  ipratropium-albuterol (DUONEB) 0.5-2.5 (3) MG/3ML SOLN Inhale 3 mLs into the lungs 4 (four) times daily. 03/22/21   [provider]  metFORMIN (GLUCOPHAGE-XR) 500 MG 24 hr tablet Take 500 mg by mouth 2 (two) times daily. 03/04/20   [provider]  metoprolol succinate (TOPROL-XL) 25 MG 24 hr tablet Take 1 tablet (25 mg total) by mouth daily. 04/03/21 09/30/21  Loel Dubonnet, NP  metroNIDAZOLE (METROCREAM) 0.75 % cream Apply topically at bedtime. qhs to face for Rosacea 02/05/21 02/05/22  Brendolyn Patty, MD  Multiple Vitamin (MULTI-VITAMIN) tablet Take 1 tablet by mouth daily.    [provider]  omeprazole (PRILOSEC) 20 MG capsule Take 1 capsule by mouth daily. 04/24/21   [provider]  pantoprazole (PROTONIX) 40 MG tablet Take 1 tablet (40 mg total) by mouth daily. 04/08/21   Sharen Hones, MD  ticagrelor (BRILINTA) 90 MG TABS tablet Take 1 tablet (90 mg total) by mouth 2 (two) times daily. 04/03/21 03/29/22  Loel Dubonnet, NP    Allergies Morphine and related  Family History  Problem Relation Age of Onset  . Breast cancer Neg Hx     Social History Social History   Tobacco Use  . Smoking status: Former Smoker    Packs/day: 0.50    Years: 45.00    Pack years: 22.50    Types: Cigarettes    Quit date: 03/11/2021    Years since quitting: 0.1  . Smokeless tobacco: Never Used  Vaping Use  . Vaping Use: Some days  . Substances: Nicotine, Flavoring  Substance Use Topics  . Alcohol use: Yes     Alcohol/week: 1.0 standard drink    Types: 1 Glasses of wine per week  . Drug use: No    Review of Systems  Review of Systems  Constitutional: Negative for chills and fever.  HENT: Negative for sore throat.   Eyes: Negative for pain.  Respiratory: Positive for cough, shortness of breath and wheezing. Negative for stridor.   Cardiovascular: Negative for chest pain.  Gastrointestinal: Negative for vomiting.  Genitourinary: Negative for dysuria.  Musculoskeletal: Negative for myalgias.  Skin: Negative for rash.  Neurological: Negative for seizures, loss of consciousness and headaches.  Psychiatric/Behavioral: Negative for suicidal ideas.  All other systems reviewed and are negative.     ____________________________________________   PHYSICAL EXAM:  VITAL SIGNS: ED Triage Vitals  Enc Vitals Group     BP 05/12/21 1734 134/85     Pulse Rate 05/12/21 1734 78     Resp 05/12/21 1734 (!) 22     Temp 05/12/21 1734 97.6 F (36.4 C)  Temp Source 05/12/21 1734 Oral     SpO2 05/12/21 1734 94 %     Weight 05/12/21 1746 143 lb 12.8 oz (65.2 kg)     Height 05/12/21 1746 5\' 1"  (1.549 m)     Head Circumference --      Peak Flow --      Pain Score 05/12/21 1746 0     Pain Loc --      Pain Edu? --      Excl. in [place]? --    Vitals:   05/12/21 1746 05/12/21 1930  BP:  (!) 152/92  Pulse:  73  Resp: (!) 30 19  Temp:    SpO2:  98%   Physical Exam Vitals and nursing note reviewed.  Constitutional:      General: She is not in acute distress.    Appearance: She is well-developed.  HENT:     Head: Normocephalic and atraumatic.     Right Ear: External ear normal.     Left Ear: External ear normal.     Nose: Nose normal.  Eyes:     Conjunctiva/sclera: Conjunctivae normal.  Cardiovascular:     Rate and Rhythm: Normal rate and regular rhythm.     Heart sounds: No murmur heard.   Pulmonary:     Effort: Tachypnea, accessory muscle usage and respiratory distress present.      Breath sounds: Decreased breath sounds and wheezing present.  Abdominal:     Palpations: Abdomen is soft.     Tenderness: There is no abdominal tenderness.  Musculoskeletal:     Cervical back: Neck supple.  Skin:    General: Skin is warm and dry.     Capillary Refill: Capillary refill takes less than 2 seconds.  Neurological:     Mental Status: She is alert and oriented to person, place, and time.  Psychiatric:        Mood and Affect: Mood normal.      ____________________________________________   LABS (all labs ordered are listed, but only abnormal results are displayed)  Labs Reviewed  BASIC METABOLIC PANEL - Abnormal; Notable for the following components:      Result Value   Glucose, Bld 163 (*)    All other components within normal limits  CBC - Abnormal; Notable for the following components:   WBC 13.3 (*)    All other components within normal limits  RESP PANEL BY RT-PCR (FLU A&B, COVID) ARPGX2  CULTURE, BLOOD (SINGLE)  BRAIN NATRIURETIC PEPTIDE  PROCALCITONIN  TROPONIN I (HIGH SENSITIVITY)  TROPONIN I (HIGH SENSITIVITY)   ____________________________________________  EKG  Sinus rhythm with a ventricular to 77 and some artifact in V1 without other evidence of clear acute ischemia or other significant underlying arrhythmia. ____________________________________________  RADIOLOGY  ED MD interpretation: No focal consolidation, large effusion, overt edema, pneumothorax or other clear acute intrathoracic process.  Official radiology report(s): DG Chest 2 View  Result Date: 05/12/2021 CLINICAL DATA:  Cough and shortness of breath. EXAM: CHEST - 2 VIEW COMPARISON:  Chest CT February 07, 2021 FINDINGS: The heart size and mediastinal contours are within normal limits. Both lungs are clear. The visualized skeletal structures are unremarkable. IMPRESSION: No active cardiopulmonary disease. Electronically Signed   By: Fidela Salisbury M.D.   On: 05/12/2021 18:32     ____________________________________________   PROCEDURES  Procedure(s) performed (including Critical Care):  .Critical Care Performed by: Lucrezia Starch, MD Authorized by: Lucrezia Starch, MD   Critical care provider statement:  Critical care time (minutes):  45   Critical care time was exclusive of:  Separately billable procedures and treating other patients   Critical care was necessary to treat or prevent imminent or life-threatening deterioration of the following conditions:  Respiratory failure   Critical care was time spent personally by me on the following activities:  Discussions with consultants, evaluation of patient's response to treatment, examination of patient, ordering and performing treatments and interventions, ordering and review of laboratory studies, ordering and review of radiographic studies, pulse oximetry, re-evaluation of patient's condition, obtaining history from patient or surrogate and review of old charts     ____________________________________________   INITIAL IMPRESSION / [place] / ED COURSE      Patient presents with above-stated history and exam for assessment of cute onset of wheezing and shortness of breath.  This in the setting of a history of remote tobacco abuse and suspected COPD exacerbation.  On arrival patient noted to be hypoxic with a room air SPO2 of 89% which improved to 93% on 2 L nasal cannula.  She otherwise has stable vital signs.  On my exam she has significant wheezing and diminished breath sounds with accessory muscle use.  Her abdomen is soft and respiratory exam is unremarkable.  Suspect likely acute COPD exacerbation.  Unclear if this is related to GERD versus some other acute insult.  No evidence of ACS or arrhythmia.  BMP shows no significant electrolyte or metabolic derangements although glucose is a little elevated 163.  Troponin is likely elevated at 19 although down from it was last checked a month  ago at 22.  Repeat troponin is 7.  BNP double digits and given absence of edema on chest x-ray or exam below special for acute heart failure.  CBC remarkable for WBC count of 13.3 down from 22.14-month ago without evidence of acute anemia explain patient's shortness of breath.   Given history of COPD with clear wheezing and significant work of breathing patient transition to BiPAP after my assessment.  We will treat with duo nebs Solu-Medrol and empiric antibiotics.  I will plan to admit to medicine service for further evaluation and management.      ____________________________________________   FINAL CLINICAL IMPRESSION(S) / ED DIAGNOSES  Final diagnoses:  COPD exacerbation ([place])  Acute respiratory failure with hypoxia ([facility])    Medications  cefTRIAXone (ROCEPHIN) 1 g in sodium chloride 0.9 % 100 mL IVPB (has no administration in time range)  azithromycin (ZITHROMAX) 500 mg in sodium chloride 0.9 % 250 mL IVPB (has no administration in time range)  ipratropium-albuterol (DUONEB) 0.5-2.5 (3) MG/3ML nebulizer solution 9 mL (9 mLs Nebulization Given 05/12/21 1944)  methylPREDNISolone sodium succinate (SOLU-MEDROL) 125 mg/2 mL injection 125 mg (125 mg Intravenous Given 05/12/21 1944)     ED Discharge Orders    None       Note:  This document was prepared using Dragon voice recognition software and may include unintentional dictation errors.   Lucrezia Starch, MD 05/12/21 2022

## 2021-05-12 NOTE — ED Notes (Signed)
Pt resting with easy unlabored respirations, states 'feel much better'

## 2021-05-12 NOTE — ED Triage Notes (Signed)
Patient reports SOB that started around 0130 today and not being able to breath. Progressively gotten worse through the day that sent her to the ER

## 2021-05-12 NOTE — ED Notes (Signed)
Pt called out reporting increased shob, pt again using accessory muscles and audible wheezing heard, pt clammy. Breathing tx initiated.

## 2021-05-12 NOTE — ED Notes (Signed)
Pt being placed on bipap

## 2021-05-12 NOTE — ED Notes (Signed)
Ambulatory sat at 89%. Pt placed on 2L [place].

## 2021-05-12 NOTE — ED Notes (Signed)
2nd blood culture sent to lab if needed

## 2021-05-13 ENCOUNTER — Encounter: Payer: Self-pay | Admitting: Family Medicine

## 2021-05-13 DIAGNOSIS — J441 Chronic obstructive pulmonary disease with (acute) exacerbation: Secondary | ICD-10-CM | POA: Diagnosis not present

## 2021-05-13 DIAGNOSIS — E119 Type 2 diabetes mellitus without complications: Secondary | ICD-10-CM | POA: Diagnosis not present

## 2021-05-13 DIAGNOSIS — J9601 Acute respiratory failure with hypoxia: Secondary | ICD-10-CM

## 2021-05-13 LAB — GLUCOSE, CAPILLARY
Glucose-Capillary: 278 mg/dL — ABNORMAL HIGH (ref 70–99)
Glucose-Capillary: 208 mg/dL — ABNORMAL HIGH (ref 70–99)
Glucose-Capillary: 172 mg/dL — ABNORMAL HIGH (ref 70–99)
Glucose-Capillary: 210 mg/dL — ABNORMAL HIGH (ref 70–99)

## 2021-05-13 LAB — CBC
HCT: 39 % (ref 36.0–46.0)
Hemoglobin: 12.8 g/dL (ref 12.0–15.0)
MCH: 29 pg (ref 26.0–34.0)
MCHC: 32.8 g/dL (ref 30.0–36.0)
MCV: 88.2 fL (ref 80.0–100.0)
Platelets: 250 10*3/uL (ref 150–400)
RBC: 4.42 MIL/uL (ref 3.87–5.11)
RDW: 15.2 % (ref 11.5–15.5)
WBC: 11.2 10*3/uL — ABNORMAL HIGH (ref 4.0–10.5)
nRBC: 0 % (ref 0.0–0.2)

## 2021-05-13 LAB — COMPREHENSIVE METABOLIC PANEL
ALT: 26 U/L (ref 0–44)
AST: 26 U/L (ref 15–41)
Albumin: 4.1 g/dL (ref 3.5–5.0)
Alkaline Phosphatase: 70 U/L (ref 38–126)
Anion gap: 9 (ref 5–15)
BUN: 12 mg/dL (ref 8–23)
CO2: 25 mmol/L (ref 22–32)
Calcium: 8.7 mg/dL — ABNORMAL LOW (ref 8.9–10.3)
Chloride: 106 mmol/L (ref 98–111)
Creatinine, Ser: 0.54 mg/dL (ref 0.44–1.00)
GFR, Estimated: >60 mL/min (ref >60)
Glucose, Bld: 252 mg/dL — ABNORMAL HIGH (ref 70–99)
Potassium: 4.9 mmol/L (ref 3.5–5.1)
Sodium: 140 mmol/L (ref 135–145)
Total Bilirubin: 0.5 mg/dL (ref 0.3–1.2)
Total Protein: 6.9 g/dL (ref 6.5–8.1)

## 2021-05-13 MED ORDER — CHLORHEXIDINE GLUCONATE 0.12 % MT SOLN
15.0000 mL | Freq: Two times a day (BID) | OROMUCOSAL | Status: DC
  Administered 2021-05-13 – 2021-05-14 (×3): 15 mL via OROMUCOSAL
  Filled 2021-05-13 (×3): qty 15

## 2021-05-13 MED ORDER — TIOTROPIUM BROMIDE MONOHYDRATE 18 MCG IN CAPS
18.0000 ug | ORAL_CAPSULE | Freq: Every day | RESPIRATORY_TRACT | Status: DC
  Administered 2021-05-13 – 2021-05-14 (×2): 18 ug via RESPIRATORY_TRACT
  Filled 2021-05-13: qty 5

## 2021-05-13 MED ORDER — BUDESONIDE 0.25 MG/2ML IN SUSP
0.2500 mg | Freq: Two times a day (BID) | RESPIRATORY_TRACT | Status: DC
  Administered 2021-05-13 – 2021-05-14 (×2): 0.25 mg via RESPIRATORY_TRACT
  Filled 2021-05-13 (×2): qty 2

## 2021-05-13 MED ORDER — INSULIN ASPART 100 UNIT/ML IJ SOLN
0.0000 [IU] | Freq: Every day | INTRAMUSCULAR | Status: DC
  Administered 2021-05-13: 2 [IU] via SUBCUTANEOUS
  Filled 2021-05-13: qty 1

## 2021-05-13 MED ORDER — METFORMIN HCL ER 500 MG PO TB24
500.0000 mg | ORAL_TABLET | Freq: Two times a day (BID) | ORAL | Status: DC
  Administered 2021-05-13 (×2): 500 mg via ORAL
  Filled 2021-05-13 (×4): qty 1

## 2021-05-13 MED ORDER — INSULIN ASPART 100 UNIT/ML IJ SOLN
0.0000 [IU] | Freq: Three times a day (TID) | INTRAMUSCULAR | Status: DC
  Administered 2021-05-13: 7 [IU] via SUBCUTANEOUS
  Administered 2021-05-13: 4 [IU] via SUBCUTANEOUS
  Filled 2021-05-13 (×4): qty 1

## 2021-05-13 MED ORDER — METHYLPREDNISOLONE SODIUM SUCC 125 MG IJ SOLR
60.0000 mg | Freq: Two times a day (BID) | INTRAMUSCULAR | Status: DC
  Administered 2021-05-13 – 2021-05-14 (×3): 60 mg via INTRAVENOUS
  Filled 2021-05-13 (×3): qty 2

## 2021-05-13 MED ORDER — IPRATROPIUM-ALBUTEROL 0.5-2.5 (3) MG/3ML IN SOLN
3.0000 mL | RESPIRATORY_TRACT | Status: DC | PRN
  Administered 2021-05-13 – 2021-05-14 (×5): 3 mL via RESPIRATORY_TRACT
  Filled 2021-05-13 (×5): qty 3

## 2021-05-13 NOTE — Progress Notes (Signed)
[facility] PROGRESS NOTE   Elaine FIDALGO IHK:[id number] DOB: May 05, 1956 DOA: 05/12/2021  PCP: [name], MD  Brief History/Interval Summary: 65 y.o. female with history of CAD status post LAD stent in April 2022, hyperlipidemia, DM2, COPD, who presented with shortness of breath.  Patient with longstanding history of smoking, greater than 40 years, quit in March of this year.  She has required 2 hospitalizations for COPD exacerbation as well.  Chest x-ray did not show any acute findings.  Hospitalized for further management.  Reason for Visit: COPD with acute exacerbation  Consultants: None  Procedures: None  Antibiotics: Anti-infectives (From admission, onward)   Start     Dose/Rate Route Frequency Ordered Stop   05/13/21 2115  azithromycin (ZITHROMAX) tablet 500 mg       "Followed by" Linked Group Details   500 mg Oral Daily 05/12/21 2110 05/17/21 0959   05/12/21 2115  azithromycin (ZITHROMAX) 500 mg in sodium chloride 0.9 % 250 mL IVPB       "Followed by" Linked Group Details   500 mg 250 mL/hr over 60 Minutes Intravenous Every 24 hours 05/12/21 2110 05/12/21 2250   05/12/21 1945  cefTRIAXone (ROCEPHIN) 1 g in sodium chloride 0.9 % 100 mL IVPB  Status:  Discontinued        1 g 200 mL/hr over 30 Minutes Intravenous  Once 05/12/21 1942 05/12/21 2140   05/12/21 1945  azithromycin (ZITHROMAX) 500 mg in sodium chloride 0.9 % 250 mL IVPB  Status:  Discontinued        500 mg 250 mL/hr over 60 Minutes Intravenous  Once 05/12/21 1942 05/12/21 2110      Subjective/Interval History: Patient mentions that she continues to have shortness of breath of a dry cough.  Denies any chest pain.  Not wheezing as much as yesterday though.  Denies any nausea vomiting.  Very anxious.    Assessment/Plan:  COPD with acute exacerbation/acute respiratory failure with hypoxia Patient requiring 2 L of oxygen by nasal cannula.  Does not use oxygen at home.  Patient with longstanding  history of smoking.  Has greater than 40-pack-year history of smoking.  Quit only in March.  She tells me that she has not been taking her Advair regularly.  Since she was feeling well she stopped using it.  She was educated that she needed to take maintenance medications on a daily basis irrespective of how she is feeling. We will switch her to 6 IV steroids for 24 hours.  Continue with azithromycin.  We will add budesonide nebulizer.  We will add Spiriva inhaler.  Mobilize as tolerated. She was congratulated on her tobacco cessation.  History of coronary artery disease Recent drug-eluting stent to LAD in April 2022.  Cardiac status seems to be stable.  She is on aspirin and Brilinta which is being continued.  She is on statin and beta-blocker as well.  Diabetes mellitus type 2 Noted to be on metformin.  Monitor CBGs.  History of osteoporosis Holding alendronate.  GERD Continue PPI  DVT Prophylaxis: Lovenox Code Status: Full code Family Communication: Discussed with the patient.  No family at bedside Disposition Plan: Hopefully return home when improved  Status is: Inpatient  Remains inpatient appropriate because:IV treatments appropriate due to intensity of illness or inability to take PO and Inpatient level of care appropriate due to severity of illness   Dispo: The patient is from: Home              Anticipated d/c  is to: Home              Patient currently is not medically stable to d/c.   Difficult to place patient No     Medications:  Scheduled: . aspirin  81 mg Oral Daily  . atorvastatin  80 mg Oral Daily  . azithromycin  500 mg Oral Daily  . budesonide (PULMICORT) nebulizer solution  0.25 mg Nebulization BID  . chlorhexidine  15 mL Mouth Rinse BID  . enoxaparin (LOVENOX) injection  40 mg Subcutaneous Q24H  . insulin aspart  0-6 Units Subcutaneous TID WC  . metFORMIN  500 mg Oral BID WC  . methylPREDNISolone (SOLU-MEDROL) injection  60 mg Intravenous Q12H  .  metoprolol succinate  25 mg Oral Daily  . multivitamin with minerals  1 tablet Oral Daily  . pantoprazole  40 mg Oral Daily  . ticagrelor  90 mg Oral BID  . tiotropium  18 mcg Inhalation Daily   Continuous:  [id number] **OR** acetaminophen, ipratropium-albuterol, ondansetron **OR** ondansetron (ZOFRAN) IV, oxyCODONE, polyethylene glycol, traZODone   Objective:  Vital Signs  Vitals:   05/13/21 0009 05/13/21 0200 05/13/21 0420 05/13/21 0541  BP: 134/82  125/87   Pulse: 94  86   Resp: 18  19   Temp: 98.1 F (36.7 C)  98.1 F (36.7 C)   TempSrc: Oral  Oral   SpO2: 100% 99% 100% 96%  Weight:      Height:       No intake or output data in the 24 hours ending 05/13/21 0918 Filed Weights   05/12/21 1746  Weight: 65.2 kg    General appearance: Awake alert.  In no distress Resp: Noted to be tachypneic without any use of accessory muscles.  Coarse breath sounds with a few crackles at the bases.  Scattered wheezing.  No rhonchi. Cardio: S1-S2 is normal regular.  No S3-S4.  No rubs murmurs or bruit GI: Abdomen is soft.  Nontender nondistended.  Bowel sounds are present normal.  No masses organomegaly Extremities: No edema.  Full range of motion of lower extremities. Neurologic: Alert and oriented x3.  No focal neurological deficits.    Lab Results:  Data Reviewed: I have personally reviewed following labs and imaging studies  CBC: Recent Labs  Lab 05/12/21 1756 05/13/21 0609  WBC 13.3* 11.2*  HGB 13.2 12.8  HCT 40.4 39.0  MCV 87.4 88.2  PLT 278 097    Basic Metabolic Panel: Recent Labs  Lab 05/12/21 1756 05/13/21 0609  NA 140 140  K 4.0 4.9  CL 106 106  CO2 26 25  GLUCOSE 163* 252*  BUN 14 12  CREATININE 0.56 0.54  CALCIUM 9.0 8.7*    GFR: Estimated Creatinine Clearance: 60.7 mL/min (by C-G formula based on SCr of 0.54 mg/dL).  Liver Function Tests: Recent Labs  Lab 05/13/21 0609  AST 26  ALT 26  ALKPHOS 70  BILITOT 0.5  PROT 6.9  ALBUMIN  4.1      Recent Results (from the past 240 hour(s))  Resp Panel by RT-PCR (Flu A&B, Covid) Nasopharyngeal Swab     Status: None   Collection Time: 05/12/21  7:29 PM   Specimen: Nasopharyngeal Swab; Nasopharyngeal(NP) swabs in vial transport medium  Result Value Ref Range Status   SARS Coronavirus 2 by RT PCR NEGATIVE NEGATIVE Final    Comment: (NOTE) SARS-CoV-2 target nucleic acids are NOT DETECTED.  The SARS-CoV-2 RNA is generally detectable in upper respiratory specimens during the acute phase of  infection. The lowest concentration of SARS-CoV-2 viral copies this assay can detect is 138 copies/mL. A negative result does not preclude SARS-Cov-2 infection and should not be used as the sole basis for treatment or other patient management decisions. A negative result may occur with  improper specimen collection/handling, submission of specimen other than nasopharyngeal swab, presence of viral mutation(s) within the areas targeted by this assay, and inadequate number of viral copies(<138 copies/mL). A negative result must be combined with clinical observations, patient history, and epidemiological information. The expected result is Negative.  Fact Sheet for Patients:  EntrepreneurPulse.com.au  Fact Sheet for Healthcare Providers:  IncredibleEmployment.be  This test is no t yet approved or cleared by the [place] FDA and  has been authorized for detection and/or diagnosis of SARS-CoV-2 by FDA under an Emergency Use Authorization (EUA). This EUA will remain  in effect (meaning this test can be used) for the duration of the COVID-19 declaration under Section 564(b)(1) of the Act, 21 U.S.C.section 360bbb-3(b)(1), unless the authorization is terminated  or revoked sooner.       Influenza A by PCR NEGATIVE NEGATIVE Final   Influenza B by PCR NEGATIVE NEGATIVE Final    Comment: (NOTE) The Xpert Xpress SARS-CoV-2/FLU/RSV plus assay is  intended as an aid in the diagnosis of influenza from Nasopharyngeal swab specimens and should not be used as a sole basis for treatment. Nasal washings and aspirates are unacceptable for Xpert Xpress SARS-CoV-2/FLU/RSV testing.  Fact Sheet for Patients: EntrepreneurPulse.com.au  Fact Sheet for Healthcare Providers: IncredibleEmployment.be  This test is not yet approved or cleared by the [place] FDA and has been authorized for detection and/or diagnosis of SARS-CoV-2 by FDA under an Emergency Use Authorization (EUA). This EUA will remain in effect (meaning this test can be used) for the duration of the COVID-19 declaration under Section 564(b)(1) of the Act, 21 U.S.C. section 360bbb-3(b)(1), unless the authorization is terminated or revoked.  Performed at [facility], [address] [place], [place] [address]   Blood culture (single)     Status: None (Preliminary result)   Collection Time: 05/12/21  7:45 PM   Specimen: BLOOD  Result Value Ref Range Status   Specimen Description BLOOD BLOOD RIGHT FOREARM  Final   Special Requests   Final    BOTTLES DRAWN AEROBIC AND ANAEROBIC Blood Culture results may not be optimal due to an inadequate volume of blood received in culture bottles   Culture   Final    NO GROWTH < 12 HOURS Performed at [facility], [address] [place], [place] [address]    Report Status PENDING  Incomplete      Radiology Studies: DG Chest 2 View  Result Date: 05/12/2021 CLINICAL DATA:  Cough and shortness of breath. EXAM: CHEST - 2 VIEW COMPARISON:  Chest CT February 07, 2021 FINDINGS: The heart size and mediastinal contours are within normal limits. Both lungs are clear. The visualized skeletal structures are unremarkable. IMPRESSION: No active cardiopulmonary disease. Electronically Signed   By: Fidela Salisbury M.D.   On: 05/12/2021 18:32       LOS: 1 day   [facility] Pager on [email or web address]  05/13/2021, 9:18 AM

## 2021-05-13 NOTE — Progress Notes (Signed)
SVN given for sob & wheezing

## 2021-05-14 DIAGNOSIS — J441 Chronic obstructive pulmonary disease with (acute) exacerbation: Secondary | ICD-10-CM | POA: Diagnosis not present

## 2021-05-14 LAB — BASIC METABOLIC PANEL
Anion gap: 9 (ref 5–15)
BUN: 18 mg/dL (ref 8–23)
CO2: 26 mmol/L (ref 22–32)
Calcium: 8.6 mg/dL — ABNORMAL LOW (ref 8.9–10.3)
Chloride: 105 mmol/L (ref 98–111)
Creatinine, Ser: 0.65 mg/dL (ref 0.44–1.00)
GFR, Estimated: >60 mL/min (ref >60)
Glucose, Bld: 229 mg/dL — ABNORMAL HIGH (ref 70–99)
Potassium: 4.8 mmol/L (ref 3.5–5.1)
Sodium: 140 mmol/L (ref 135–145)

## 2021-05-14 LAB — CBC
HCT: 38.5 % (ref 36.0–46.0)
Hemoglobin: 12.6 g/dL (ref 12.0–15.0)
MCH: 28.5 pg (ref 26.0–34.0)
MCHC: 32.7 g/dL (ref 30.0–36.0)
MCV: 87.1 fL (ref 80.0–100.0)
Platelets: 271 10*3/uL (ref 150–400)
RBC: 4.42 MIL/uL (ref 3.87–5.11)
RDW: 15.6 % — ABNORMAL HIGH (ref 11.5–15.5)
WBC: 21.3 10*3/uL — ABNORMAL HIGH (ref 4.0–10.5)
nRBC: 0 % (ref 0.0–0.2)

## 2021-05-14 LAB — GLUCOSE, CAPILLARY: Glucose-Capillary: 204 mg/dL — ABNORMAL HIGH (ref 70–99)

## 2021-05-14 MED ORDER — TIOTROPIUM BROMIDE MONOHYDRATE 18 MCG IN CAPS: 18.0000 ug | ORAL_CAPSULE | Freq: Every day | RESPIRATORY_TRACT | 12 refills | Status: DC

## 2021-05-14 MED ORDER — AZITHROMYCIN 500 MG PO TABS: 500.0000 mg | ORAL_TABLET | Freq: Every day | ORAL | 0 refills | Status: AC

## 2021-05-14 MED ORDER — IPRATROPIUM-ALBUTEROL 0.5-2.5 (3) MG/3ML IN SOLN: 3.0000 mL | Freq: Four times a day (QID) | RESPIRATORY_TRACT | Status: DC | PRN

## 2021-05-14 MED ORDER — PREDNISONE 20 MG PO TABS: ORAL_TABLET | ORAL | 0 refills | Status: DC

## 2021-05-14 NOTE — Progress Notes (Signed)
Bipap declined by pt 

## 2021-05-14 NOTE — Discharge Instructions (Signed)
Chronic Obstructive Pulmonary Disease  Chronic obstructive pulmonary disease (COPD) is a long-term (chronic) lung problem. When you have COPD, it is hard for air to get in and out of your lungs. Usually the condition gets worse over time, and your lungs will never return to normal. There are things you can do to keep yourself as healthy as possible. What are the causes?  Smoking. This is the most common cause.  Certain genes passed from parent to child (inherited). What increases the risk?  Being exposed to secondhand smoke from cigarettes, pipes, or cigars.  Being exposed to chemicals and other irritants, such as fumes and dust in the work environment.  Having chronic lung conditions or infections. What are the signs or symptoms?  Shortness of breath, especially during physical activity.  A long-term cough with a large amount of thick mucus. Sometimes, the cough may not have any mucus (dry cough).  Wheezing.  Breathing quickly.  Skin that looks gray or blue, especially in the fingers, toes, or lips.  Feeling tired (fatigue).  Weight loss.  Chest tightness.  Having infections often.  Episodes when breathing symptoms become much worse (exacerbations). At the later stages of this disease, you may have swelling in the ankles, feet, or legs. How is this treated?  Taking medicines.  Quitting smoking, if you smoke.  Rehabilitation. This includes steps to make your body work better. It may involve a team of specialists.  Doing exercises.  Making changes to your diet.  Using oxygen.  Lung surgery.  Lung transplant.  Comfort measures (palliative care). Follow these instructions at home: Medicines  Take over-the-counter and prescription medicines only as told by your doctor.  Talk to your doctor before taking any cough or allergy medicines. You may need to avoid medicines that cause your lungs to be dry. Lifestyle  If you smoke, stop smoking. Smoking makes the  problem worse.  Do not smoke or use any products that contain nicotine or tobacco. If you need help quitting, ask your doctor.  Avoid being around things that make your breathing worse. This may include smoke, chemicals, and fumes.  Stay active, but remember to rest as well.  Learn and use tips on how to manage stress and control your breathing.  Make sure you get enough sleep. Most adults need at least 7 hours of sleep every night.  Eat healthy foods. Eat smaller meals more often. Rest before meals. Controlled breathing Learn and use tips on how to control your breathing as told by your doctor. Try:  Breathing in (inhaling) through your nose for 1 second. Then, pucker your lips and breath out (exhale) through your lips for 2 seconds.  Putting one hand on your belly (abdomen). Breathe in slowly through your nose for 1 second. Your hand on your belly should move out. Pucker your lips and breathe out slowly through your lips. Your hand on your belly should move in as you breathe out.   Controlled coughing Learn and use controlled coughing to clear mucus from your lungs. Follow these steps: 1. Lean your head a little forward. 2. Breathe in deeply. 3. Try to hold your breath for 3 seconds. 4. Keep your mouth slightly open while coughing 2 times. 5. Spit any mucus out into a tissue. 6. Rest and do the steps again 1 or 2 times as needed. General instructions  Make sure you get all the shots (vaccines) that your doctor recommends. Ask your doctor about a flu shot and a pneumonia shot.    Use oxygen therapy and pulmonary rehabilitation if told by your doctor. If you need home oxygen therapy, ask your doctor if you should buy a tool to measure your oxygen level (oximeter).  Make a COPD action plan with your doctor. This helps you to know what to do if you feel worse than usual.  Manage any other conditions you have as told by your doctor.  Avoid going outside when it is very hot, cold, or  humid.  Avoid people who have a sickness you can catch (contagious).  Keep all follow-up visits. Contact a doctor if:  You cough up more mucus than usual.  There is a change in the color or thickness of the mucus.  It is harder to breathe than usual.  Your breathing is faster than usual.  You have trouble sleeping.  You need to use your medicines more often than usual.  You have trouble doing your normal activities such as getting dressed or walking around the house. Get help right away if:  You have shortness of breath while resting.  You have shortness of breath that stops you from: ? Being able to talk. ? Doing normal activities.  Your chest hurts for longer than 5 minutes.  Your skin color is more blue than usual.  Your pulse oximeter shows that you have low oxygen for longer than 5 minutes.  You have a fever.  You feel too tired to breathe normally. These symptoms may represent a serious problem that is an emergency. Do not wait to see if the symptoms will go away. Get medical help right away. Call your local emergency services (911 in the [place] Do not drive yourself to the hospital. Summary  Chronic obstructive pulmonary disease (COPD) is a long-term lung problem.  The way your lungs work will never return to normal. Usually the condition gets worse over time. There are things you can do to keep yourself as healthy as possible.  Take over-the-counter and prescription medicines only as told by your doctor.  If you smoke, stop. Smoking makes the problem worse. This information is not intended to replace advice given to you by your health care provider. Make sure you discuss any questions you have with your health care provider. Document Revised: 10/10/2020 Document Reviewed: 10/10/2020 Elsevier Patient Education  2021 Elsevier Inc.   

## 2021-05-14 NOTE — Discharge Summary (Signed)
[facility]  Physician Discharge Summary   Patient ID: TAILER VOLKERT MRN: [id number] DOB/AGE: 1955-12-21 65 y.o.  Admit date: 05/12/2021 Discharge date: 05/14/2021    PCP: [name], MD  DISCHARGE DIAGNOSES:  COPD with acute exacerbation Acute respiratory failure with hypoxia, resolved Coronary artery disease Diabetes mellitus type 2 History of osteoporosis  RECOMMENDATIONS FOR OUTPATIENT FOLLOW UP: 1. Follow-up with PCP in 1 week   Home Health: None  Equipment/Devices:none   CODE STATUS: Full code  DISCHARGE CONDITION: fair  Diet recommendation: Healthy  INITIAL HISTORY: 65 y.o.femalewith history of CAD status post LAD stent in April 2022, hyperlipidemia, DM2, COPD, who presented with shortness of breath.  Patient with longstanding history of smoking, greater than 40 years, quit in March of this year.  She has required 2 hospitalizations for COPD exacerbation as well.  Chest x-ray did not show any acute findings.  Hospitalized for further management.   HOSPITAL COURSE:   COPD with acute exacerbation/acute respiratory failure with hypoxia Patient was requiring 2 L of oxygen by nasal cannula.  Does not use oxygen at home.  Patient with longstanding history of smoking.  Has greater than 40-pack-year history of smoking.  Quit only in March.  She tells me that she has not been taking her Advair regularly.  Since she was feeling well she stopped using it.  She was educated that she needed to take maintenance medications on a daily basis irrespective of how she is feeling. Patient was placed on scheduled bronchodilators, started back on inhaled steroids.  Given systemic steroids.  Patient slowly started improving.  Has been weaned off of oxygen.  Ambulated prior to discharge.  Does not meet criteria for home oxygen. Patient also started on Spiriva.  She will be discharged on the same along with instructions to continue Advair on a scheduled basis.  She already  has nebulizer medications at home.  She will also be discharged on a steroid taper. Continued tobacco cessation was emphasized.  History of coronary artery disease Recent drug-eluting stent to LAD in April 2022.  Cardiac status seems to be stable.  She is on aspirin and Brilinta which is being continued.  She is on statin and beta-blocker as well.  Diabetes mellitus type 2 Noted to be on metformin.  Monitor CBGs.  History of osteoporosis Holding alendronate.  GERD Continue PPI   Patient is stable.  Okay for discharge home today.   PERTINENT LABS:  The results of significant diagnostics from this hospitalization (including imaging, microbiology, ancillary and laboratory) are listed below for reference.    Microbiology: Recent Results (from the past 240 hour(s))  Resp Panel by RT-PCR (Flu A&B, Covid) Nasopharyngeal Swab     Status: None   Collection Time: 05/12/21  7:29 PM   Specimen: Nasopharyngeal Swab; Nasopharyngeal(NP) swabs in vial transport medium  Result Value Ref Range Status   SARS Coronavirus 2 by RT PCR NEGATIVE NEGATIVE Final    Comment: (NOTE) SARS-CoV-2 target nucleic acids are NOT DETECTED.  The SARS-CoV-2 RNA is generally detectable in upper respiratory specimens during the acute phase of infection. The lowest concentration of SARS-CoV-2 viral copies this assay can detect is 138 copies/mL. A negative result does not preclude SARS-Cov-2 infection and should not be used as the sole basis for treatment or other patient management decisions. A negative result may occur with  improper specimen collection/handling, submission of specimen other than nasopharyngeal swab, presence of viral mutation(s) within the areas targeted by this assay, and inadequate number of  viral copies(<138 copies/mL). A negative result must be combined with clinical observations, patient history, and epidemiological information. The expected result is Negative.  Fact Sheet for  Patients:  EntrepreneurPulse.com.au  Fact Sheet for Healthcare Providers:  IncredibleEmployment.be  This test is no t yet approved or cleared by the [place] FDA and  has been authorized for detection and/or diagnosis of SARS-CoV-2 by FDA under an Emergency Use Authorization (EUA). This EUA will remain  in effect (meaning this test can be used) for the duration of the COVID-19 declaration under Section 564(b)(1) of the Act, 21 U.S.C.section 360bbb-3(b)(1), unless the authorization is terminated  or revoked sooner.       Influenza A by PCR NEGATIVE NEGATIVE Final   Influenza B by PCR NEGATIVE NEGATIVE Final    Comment: (NOTE) The Xpert Xpress SARS-CoV-2/FLU/RSV plus assay is intended as an aid in the diagnosis of influenza from Nasopharyngeal swab specimens and should not be used as a sole basis for treatment. Nasal washings and aspirates are unacceptable for Xpert Xpress SARS-CoV-2/FLU/RSV testing.  Fact Sheet for Patients: EntrepreneurPulse.com.au  Fact Sheet for Healthcare Providers: IncredibleEmployment.be  This test is not yet approved or cleared by the [place] FDA and has been authorized for detection and/or diagnosis of SARS-CoV-2 by FDA under an Emergency Use Authorization (EUA). This EUA will remain in effect (meaning this test can be used) for the duration of the COVID-19 declaration under Section 564(b)(1) of the Act, 21 U.S.C. section 360bbb-3(b)(1), unless the authorization is terminated or revoked.  Performed at [facility], [address] [place], [place] [address]   Blood culture (single)     Status: None (Preliminary result)   Collection Time: 05/12/21  7:45 PM   Specimen: BLOOD  Result Value Ref Range Status   Specimen Description BLOOD BLOOD RIGHT FOREARM  Final   Special Requests   Final    BOTTLES DRAWN AEROBIC AND ANAEROBIC Blood Culture results may not be  optimal due to an inadequate volume of blood received in culture bottles   Culture   Final    NO GROWTH 3 DAYS Performed at [facility], [address] [place],  [address]    Report Status PENDING  Incomplete     Labs:  COVID-19 Labs    Lab Results  Component Value Date   [place] 05/12/2021   [place] NEGATIVE 04/05/2021   [place] NEGATIVE 03/24/2021   [place] NEGATIVE 04/12/2020      Basic Metabolic Panel: Recent Labs  Lab 05/12/21 1756 05/13/21 0609 05/14/21 0610  NA 140 140 140  K 4.0 4.9 4.8  CL 106 106 105  CO2 26 25 26   GLUCOSE 163* 252* 229*  BUN 14 12 18   CREATININE 0.56 0.54 0.65  CALCIUM 9.0 8.7* 8.6*   Liver Function Tests: Recent Labs  Lab 05/13/21 0609  AST 26  ALT 26  ALKPHOS 70  BILITOT 0.5  PROT 6.9  ALBUMIN 4.1   CBC: Recent Labs  Lab 05/12/21 1756 05/13/21 0609 05/14/21 0610  WBC 13.3* 11.2* 21.3*  HGB 13.2 12.8 12.6  HCT 40.4 39.0 38.5  MCV 87.4 88.2 87.1  PLT 278 250 271   BNP: BNP (last 3 results) Recent Labs    03/25/21 0408 04/05/21 0825 05/12/21 1756  BNP 620.9* 37.9 12.3     CBG: Recent Labs  Lab 05/13/21 0951 05/13/21 1253 05/13/21 1635 05/13/21 2047 05/14/21 0758  GLUCAP 278* 208* 172* 210* 204*     IMAGING STUDIES DG Chest  2 View  Result Date: 05/12/2021 CLINICAL DATA:  Cough and shortness of breath. EXAM: CHEST - 2 VIEW COMPARISON:  Chest CT February 07, 2021 FINDINGS: The heart size and mediastinal contours are within normal limits. Both lungs are clear. The visualized skeletal structures are unremarkable. IMPRESSION: No active cardiopulmonary disease. Electronically Signed   By: Fidela Salisbury M.D.   On: 05/12/2021 18:32    DISCHARGE EXAMINATION: Vitals:   05/14/21 0447 05/14/21 0708 05/14/21 0757 05/14/21 1136  BP: 120/78  117/86 108/77  Pulse: 83  85 68  Resp: 20  16 15   Temp: 98.1 F (36.7 C)  (!) 97.5 F (36.4 C) 98.3 F (36.8 C)   TempSrc:      SpO2: 94% 95% 96% 100%  Weight:      Height:       General appearance: Awake alert.  In no distress Resp: Clear to auscultation bilaterally.  Normal effort Cardio: S1-S2 is normal regular.  No S3-S4.  No rubs murmurs or bruit GI: Abdomen is soft.  Nontender nondistended.  Bowel sounds are present normal.  No masses organomegaly    DISPOSITION: Home  Discharge Instructions    Call MD for:  difficulty breathing, headache or visual disturbances   Complete by: As directed    Call MD for:  extreme fatigue   Complete by: As directed    Call MD for:  persistant dizziness or light-headedness   Complete by: As directed    Call MD for:  persistant nausea and vomiting   Complete by: As directed    Call MD for:  severe uncontrolled pain   Complete by: As directed    Call MD for:  temperature >100.4   Complete by: As directed    Diet Carb Modified   Complete by: As directed    Discharge instructions   Complete by: As directed    Please be sure to follow-up with your primary care provider within 1 week.  Take your medications including your inhalers as prescribed and as per our discussions this morning.  You were cared for by a hospitalist during your hospital stay. If you have any questions about your discharge medications or the care you received while you were in the hospital after you are discharged, you can call the unit and asked to speak with the hospitalist on call if the hospitalist that took care of you is not available. Once you are discharged, your primary care physician will handle any further medical issues. Please note that NO REFILLS for any discharge medications will be authorized once you are discharged, as it is imperative that you return to your primary care physician (or establish a relationship with a primary care physician if you do not have one) for your aftercare needs so that they can reassess your need for medications and monitor your lab values. If you  do not have a primary care physician, you can call [phone] for a physician referral.   Increase activity slowly   Complete by: As directed         Allergies as of 05/14/2021      Reactions   Morphine And Related Other (See Comments)   Confusion, hallucinations      Medication List    STOP taking these medications   omeprazole 20 MG capsule Commonly known as: PRILOSEC     TAKE these medications   alendronate 70 MG tablet Commonly known as: FOSAMAX Take 70 mg by mouth every Friday.   aspirin 81 MG  chewable tablet Chew 1 tablet (81 mg total) by mouth daily.   atorvastatin 80 MG tablet Commonly known as: LIPITOR Take 1 tablet (80 mg total) by mouth daily.   azithromycin 500 MG tablet Commonly known as: ZITHROMAX Take 1 tablet (500 mg total) by mouth daily for 4 days.   dextromethorphan-guaiFENesin 30-600 MG 12hr tablet Commonly known as: MUCINEX DM Take 1 tablet by mouth 2 (two) times daily as needed for cough.   Fluticasone-Salmeterol 250-50 MCG/DOSE Aepb Commonly known as: Advair Diskus Inhale 1 puff into the lungs in the morning and at bedtime.   ipratropium-albuterol 0.5-2.5 (3) MG/3ML Soln Commonly known as: DUONEB Inhale 3 mLs into the lungs every 6 (six) hours as needed. What changed:   when to take this  reasons to take this   metFORMIN 500 MG 24 hr tablet Commonly known as: GLUCOPHAGE-XR Take 500 mg by mouth 2 (two) times daily.   metoprolol succinate 25 MG 24 hr tablet Commonly known as: TOPROL-XL Take 1 tablet (25 mg total) by mouth daily.   metroNIDAZOLE 0.75 % cream Commonly known as: METROCREAM Apply topically at bedtime. qhs to face for Rosacea   Multi-Vitamin tablet Take 1 tablet by mouth daily.   pantoprazole 40 MG tablet Commonly known as: PROTONIX Take 1 tablet (40 mg total) by mouth daily.   predniSONE 20 MG tablet Commonly known as: DELTASONE Take 3 tablets once daily for 3 days followed by 2 tablets once daily for 3 days  followed by 1 tablet once daily for 3 days and then stop   THERATEARS OP Place 1 drop into both eyes daily as needed (when using contacts).   ticagrelor 90 MG Tabs tablet Commonly known as: BRILINTA Take 1 tablet (90 mg total) by mouth 2 (two) times daily.   tiotropium 18 MCG inhalation capsule Commonly known as: SPIRIVA Place 1 capsule (18 mcg total) into inhaler and inhale daily.         Follow-up Information    [name], MD. Schedule an appointment as soon as possible for a visit in 1 week(s).   Specialty: Family Medicine Contact information: [address] [address] [phone]               TOTAL DISCHARGE TIME: 35 minutes  [facility] Pager on [email or web address]  05/15/2021, 12:38 PM

## 2021-05-14 NOTE — Plan of Care (Signed)
  Problem: Education: Goal: Knowledge of General Education information will improve Description: Including pain rating scale, medication(s)/side effects and non-pharmacologic comfort measures Outcome: Progressing   Problem: Activity: Goal: Risk for activity intolerance will decrease Outcome: Progressing   Problem: Pain Managment: Goal: General experience of comfort will improve Outcome: Progressing   Problem: Safety: Goal: Ability to remain free from injury will improve Outcome: Progressing   Problem: Skin Integrity: Goal: Risk for impaired skin integrity will decrease Outcome: Progressing   Problem: Education: Goal: Knowledge of disease or condition will improve Outcome: Progressing Goal: Knowledge of the prescribed therapeutic regimen will improve Outcome: Progressing   Problem: Activity: Goal: Ability to tolerate increased activity will improve Outcome: Progressing Goal: Will verbalize the importance of balancing activity with adequate rest periods Outcome: Progressing   Problem: Respiratory: Goal: Ability to maintain a clear airway will improve Outcome: Progressing Goal: Levels of oxygenation will improve Outcome: Progressing Goal: Ability to maintain adequate ventilation will improve Outcome: Progressing

## 2021-05-15 LAB — HEMOGLOBIN A1C
Hgb A1c MFr Bld: 7.6 % — ABNORMAL HIGH (ref 4.8–5.6)
Mean Plasma Glucose: 171 mg/dL

## 2021-05-19 LAB — CULTURE, BLOOD (SINGLE): Culture: NO GROWTH

## 2021-05-24 ENCOUNTER — Telehealth: Payer: Self-pay

## 2021-05-24 ENCOUNTER — Other Ambulatory Visit: Payer: Self-pay

## 2021-05-24 DIAGNOSIS — K219 Gastro-esophageal reflux disease without esophagitis: Secondary | ICD-10-CM

## 2021-05-24 DIAGNOSIS — R1319 Other dysphagia: Secondary | ICD-10-CM

## 2021-05-24 NOTE — Telephone Encounter (Signed)
Called patient and she agreed on doing an EGD on Monday so Dr. Vicente Males could scope her and decide what she would need after the fact to help her with her symptoms. Patient is scheduled to be done on 05/28/2021 at the [facility]. Instructions were verbally given and I will also send through MyChart and patient was advised that I would send. Patient had no further questions.

## 2021-05-24 NOTE — Telephone Encounter (Signed)
Patient saw Dr. Vicente Males on 05/10/2021 and they took her off the omeprazole. She states that since being off the medication she has had GERD symptoms and dysphagia. She thinks she needs a EGD or be put back on a PPI medication.

## 2021-05-25 ENCOUNTER — Encounter: Payer: Self-pay | Admitting: Gastroenterology

## 2021-05-28 ENCOUNTER — Ambulatory Visit: Payer: Medicare Other | Admitting: Certified Registered Nurse Anesthetist

## 2021-05-28 ENCOUNTER — Encounter
Admission: RE | Disposition: A | Discharge status: Home / Self Care | Payer: Self-pay | Source: Ambulatory Visit | Attending: Gastroenterology

## 2021-05-28 ENCOUNTER — Ambulatory Visit
Admission: RE | Admit: 2021-05-28 | Discharge: 2021-05-28 | Disposition: A | Discharge status: Home / Self Care | Payer: Medicare Other | Source: Ambulatory Visit | Attending: Gastroenterology | Admitting: Gastroenterology

## 2021-05-28 ENCOUNTER — Encounter: Payer: Self-pay | Admitting: Gastroenterology

## 2021-05-28 DIAGNOSIS — Z90710 Acquired absence of both cervix and uterus: Secondary | ICD-10-CM | POA: Insufficient documentation

## 2021-05-28 DIAGNOSIS — Z79899 Other long term (current) drug therapy: Secondary | ICD-10-CM | POA: Diagnosis not present

## 2021-05-28 DIAGNOSIS — Z7984 Long term (current) use of oral hypoglycemic drugs: Secondary | ICD-10-CM | POA: Diagnosis not present

## 2021-05-28 DIAGNOSIS — R1319 Other dysphagia: Secondary | ICD-10-CM | POA: Diagnosis not present

## 2021-05-28 DIAGNOSIS — R1310 Dysphagia, unspecified: Secondary | ICD-10-CM | POA: Diagnosis present

## 2021-05-28 DIAGNOSIS — G4730 Sleep apnea, unspecified: Secondary | ICD-10-CM | POA: Insufficient documentation

## 2021-05-28 DIAGNOSIS — Z7983 Long term (current) use of bisphosphonates: Secondary | ICD-10-CM | POA: Insufficient documentation

## 2021-05-28 DIAGNOSIS — E1136 Type 2 diabetes mellitus with diabetic cataract: Secondary | ICD-10-CM | POA: Diagnosis not present

## 2021-05-28 DIAGNOSIS — K449 Diaphragmatic hernia without obstruction or gangrene: Secondary | ICD-10-CM | POA: Diagnosis not present

## 2021-05-28 DIAGNOSIS — Z96652 Presence of left artificial knee joint: Secondary | ICD-10-CM | POA: Diagnosis not present

## 2021-05-28 DIAGNOSIS — K2100 Gastro-esophageal reflux disease with esophagitis, without bleeding: Secondary | ICD-10-CM | POA: Insufficient documentation

## 2021-05-28 DIAGNOSIS — Z7982 Long term (current) use of aspirin: Secondary | ICD-10-CM | POA: Diagnosis not present

## 2021-05-28 DIAGNOSIS — Z7951 Long term (current) use of inhaled steroids: Secondary | ICD-10-CM | POA: Diagnosis not present

## 2021-05-28 DIAGNOSIS — K219 Gastro-esophageal reflux disease without esophagitis: Secondary | ICD-10-CM

## 2021-05-28 HISTORY — PX: ESOPHAGOGASTRODUODENOSCOPY (EGD) WITH PROPOFOL: SHX5813

## 2021-05-28 LAB — GLUCOSE, CAPILLARY: Glucose-Capillary: 138 mg/dL — ABNORMAL HIGH (ref 70–99)

## 2021-05-28 SURGERY — ESOPHAGOGASTRODUODENOSCOPY (EGD) WITH PROPOFOL
Anesthesia: General

## 2021-05-28 MED ORDER — GLYCOPYRROLATE 0.2 MG/ML IJ SOLN
INTRAMUSCULAR | Status: DC | PRN
  Administered 2021-05-28: .2 mg via INTRAVENOUS

## 2021-05-28 MED ORDER — PROPOFOL 10 MG/ML IV BOLUS
INTRAVENOUS | Status: DC | PRN
  Administered 2021-05-28: 60 mg via INTRAVENOUS

## 2021-05-28 MED ORDER — PHENYLEPHRINE HCL (PRESSORS) 10 MG/ML IV SOLN
INTRAVENOUS | Status: DC | PRN
  Administered 2021-05-28: 100 ug via INTRAVENOUS

## 2021-05-28 MED ORDER — SODIUM CHLORIDE 0.9 % IV SOLN
INTRAVENOUS | Status: DC
Start: 2021-05-28 — End: 2021-05-28
  Administered 2021-05-28: 20 mL/h via INTRAVENOUS

## 2021-05-28 MED ORDER — LIDOCAINE HCL (CARDIAC) PF 100 MG/5ML IV SOSY
PREFILLED_SYRINGE | INTRAVENOUS | Status: DC | PRN
  Administered 2021-05-28: 100 mg via INTRAVENOUS

## 2021-05-28 MED ORDER — PROPOFOL 500 MG/50ML IV EMUL
INTRAVENOUS | Status: DC | PRN
  Administered 2021-05-28: 150 ug/kg/min via INTRAVENOUS

## 2021-05-28 NOTE — Anesthesia Preprocedure Evaluation (Signed)
Anesthesia Evaluation  Patient identified by MRN, date of birth, ID band Patient awake    Reviewed: Allergy & Precautions, NPO status , Patient's Chart, lab work & pertinent test results, reviewed documented beta blocker date and time   History of Anesthesia Complications Negative for: history of anesthetic complications  Airway Mallampati: II  TM Distance: <3 FB Neck ROM: Full    Dental no notable dental hx. (+) Poor Dentition   Pulmonary sleep apnea and Continuous Positive Airway Pressure Ventilation , neg COPD, former smoker,    breath sounds clear to auscultation- rhonchi (-) wheezing      Cardiovascular Exercise Tolerance: Good hypertension, Pt. on home beta blockers and Pt. on medications + CAD and + Past MI  (-) Cardiac Stents and (-) CABG  Rhythm:Regular Rate:Normal - Systolic murmurs and - Diastolic murmurs    Neuro/Psych neg Seizures negative neurological ROS  negative psych ROS   GI/Hepatic Neg liver ROS, GERD  Medicated,  Endo/Other  diabetes, Well Controlled, Type 2, Oral Hypoglycemic Agents  Renal/GU negative Renal ROS     Musculoskeletal  (+) Arthritis ,   Abdominal (+) - obese,   Peds  Hematology negative hematology ROS (+)   Anesthesia Other Findings Arthritis    CAD MI 1. Severe proximal LAD stenosis best seen in the LAO view. This was confirmed with IVUS imaging. She likely became ischemic from this lesion when she became hypoxic and hypertensive at home. 2. Successful PTCA/DES x 1 proximal LAD 3. No obstructive disease in the left main, Circumflex or RCA   Diabetes mellitus without complication (HCC)    GERD (gastroesophageal reflux disease)    Sleep apnea       Reproductive/Obstetrics                             Anesthesia Physical  Anesthesia Plan  ASA: 3  Anesthesia Plan: General   Post-op Pain Management:    Induction: Intravenous  PONV Risk Score  and Plan: 2 and Propofol infusion and TIVA  Airway Management Planned: Natural Airway and Nasal Cannula  Additional Equipment:   Intra-op Plan:   Post-operative Plan: Extubation in OR  Informed Consent: I have reviewed the patients History and Physical, chart, labs and discussed the procedure including the risks, benefits and alternatives for the proposed anesthesia with the patient or authorized representative who has indicated his/her understanding and acceptance.     Dental advisory given  Plan Discussed with: CRNA and Anesthesiologist  Anesthesia Plan Comments:         Anesthesia Quick Evaluation

## 2021-05-28 NOTE — Transfer of Care (Signed)
Immediate Anesthesia Transfer of Care Note  Patient: Elaine Wright  Procedure(s) Performed: ESOPHAGOGASTRODUODENOSCOPY (EGD) WITH PROPOFOL  Patient Location: Endoscopy Unit  Anesthesia Type:General  Level of Consciousness: drowsy  Airway & Oxygen Therapy: Patient Spontanous Breathing  Post-op Assessment: Report given to RN and Post -op Vital signs reviewed and stable  Post vital signs: Reviewed and stable  Last Vitals:  Vitals Value Taken Time  BP 114/63 05/28/21 1102  Temp 36.4 C 05/28/21 1058  Pulse 62 05/28/21 1102  Resp 14 05/28/21 1102  SpO2 95 % 05/28/21 1102  Vitals shown include unvalidated device data.  Last Pain:  Vitals:   05/28/21 1058  TempSrc: Temporal  PainSc: Asleep         Complications: No notable events documented.

## 2021-05-28 NOTE — Anesthesia Procedure Notes (Signed)
Date/Time: 05/28/2021 10:45 AM Performed by: Lily Peer, Angila Wombles, CRNA Pre-anesthesia Checklist: Patient identified, Emergency Drugs available, Suction available, Patient being monitored and Timeout performed Patient Re-evaluated:Patient Re-evaluated prior to induction Oxygen Delivery Method: Nasal cannula Induction Type: IV induction

## 2021-05-28 NOTE — Anesthesia Postprocedure Evaluation (Signed)
Anesthesia Post Note  Patient: Elaine Wright  Procedure(s) Performed: ESOPHAGOGASTRODUODENOSCOPY (EGD) WITH PROPOFOL  Patient location during evaluation: Phase II Anesthesia Type: General Level of consciousness: awake and alert, awake and oriented Pain management: pain level controlled Vital Signs Assessment: post-procedure vital signs reviewed and stable Respiratory status: spontaneous breathing, nonlabored ventilation and respiratory function stable Cardiovascular status: blood pressure returned to baseline and stable Postop Assessment: no apparent nausea or vomiting Anesthetic complications: no   No notable events documented.   Last Vitals:  Vitals:   05/28/21 1108 05/28/21 1128  BP: 105/69 126/84  Pulse:    Resp:    Temp:    SpO2:      Last Pain:  Vitals:   05/28/21 1128  TempSrc:   PainSc: 0-No pain                 Phill Mutter

## 2021-05-28 NOTE — H&P (Signed)
Jonathon Bellows, MD [address], [place], [place], [place], [address] [address], [place], [place], [place], [address] Phone: [phone]  Fax: [phone]  Primary Care Physician:  [name], MD   Pre-Procedure History & Physical: HPI:  [name] is a 65 y.o. female is here for an endoscopy    Past Medical History:  Diagnosis Date   Arthritis    Diabetes mellitus without complication ([place])    GERD (gastroesophageal reflux disease)    Sleep apnea     Past Surgical History:  Procedure Laterality Date   ABDOMINAL HYSTERECTOMY  2003   ANTERIOR AND POSTERIOR REPAIR N/A 04/14/2020   Procedure: ANTERIOR (CYSTOCELE) AND POSTERIOR REPAIR (RECTOCELE);  Surgeon: Schermerhorn, Gwen Her, MD;  Location: [facility];  Service: Gynecology;  Laterality: N/A;   CORONARY/GRAFT ACUTE MI REVASCULARIZATION N/A 03/24/2021   Procedure: Coronary/Graft Acute MI Revascularization;  Surgeon: Burnell Blanks, MD;  Location: [facility];  Service: Cardiovascular;  Laterality: N/A;   EYE SURGERY  2011   Cataract bil   LEFT HEART CATH AND CORONARY ANGIOGRAPHY N/A 03/24/2021   Procedure: LEFT HEART CATH AND CORONARY ANGIOGRAPHY;  Surgeon: Burnell Blanks, MD;  Location: [facility];  Service: Cardiovascular;  Laterality: N/A;   PARTIAL KNEE ARTHROPLASTY  2009   right    Thumb joint     Tendon from arm to replace arthrititic thumb   TOTAL KNEE ARTHROPLASTY  12/23/2012   Procedure: TOTAL KNEE ARTHROPLASTY;  Surgeon: Ninetta Lights, MD;  Location: [facility];  Service: Orthopedics;  Laterality: Left;   TRACHELECTOMY N/A 04/14/2020   Procedure: LAPAROSCOPIC TRACHELECTOMY;  Surgeon: Schermerhorn, Gwen Her, MD;  Location: [facility];  Service: Gynecology;  Laterality: N/A;    Prior to Admission medications   Medication Sig Start Date End Date Taking? Authorizing Provider  alendronate (FOSAMAX) 70 MG tablet Take 70 mg by mouth every Friday. 02/07/21  Yes [provider]  aspirin 81 MG chewable tablet Chew 1 tablet (81 mg total) by mouth daily. 03/28/21  Yes Sreenath, Sudheer B, MD  atorvastatin (LIPITOR) 80 MG tablet Take 1 tablet (80 mg total) by mouth daily. 04/03/21 09/30/21 Yes Loel Dubonnet, NP  Carboxymethylcellulose Sodium (THERATEARS OP) Place 1 drop into both eyes daily as needed (when using contacts).   Yes [provider]  dextromethorphan-guaiFENesin (MUCINEX DM) 30-600 MG 12hr tablet Take 1 tablet by mouth 2 (two) times daily as needed for cough.   Yes [provider]  ipratropium-albuterol (DUONEB) 0.5-2.5 (3) MG/3ML SOLN Inhale 3 mLs into the lungs every 6 (six) hours as needed. 05/14/21  Yes Bonnielee Haff, MD  metFORMIN (GLUCOPHAGE-XR) 500 MG 24 hr tablet Take 500 mg by mouth 2 (two) times daily. 03/04/20  Yes [provider]  metoprolol succinate (TOPROL-XL) 25 MG 24 hr tablet Take 1 tablet (25 mg total) by mouth daily. 04/03/21 09/30/21 Yes Loel Dubonnet, NP  metroNIDAZOLE (METROCREAM) 0.75 % cream Apply topically at bedtime. qhs to face for Rosacea 02/05/21 02/05/22 Yes Brendolyn Patty, MD  Multiple Vitamin (MULTI-VITAMIN) tablet Take 1 tablet by mouth daily.   Yes [provider]  pantoprazole (PROTONIX) 40 MG tablet Take 1 tablet (40 mg total) by mouth daily. 04/08/21  Yes Sharen Hones, MD  predniSONE (DELTASONE) 20 MG tablet Take 3 tablets once daily for 3 days followed by 2 tablets once daily for 3 days followed by 1 tablet once daily for 3 days and then stop 05/14/21  Yes Bonnielee Haff, MD  ticagrelor (BRILINTA) 90 MG TABS tablet Take 1 tablet (90 mg total) by mouth 2 (two) times daily. 04/03/21 03/29/22 Yes Loel Dubonnet, NP  tiotropium (SPIRIVA) 18 MCG inhalation capsule Place 1 capsule (18 mcg total) into inhaler and inhale daily. 05/14/21  Yes Bonnielee Haff, MD  Fluticasone-Salmeterol (ADVAIR DISKUS) 250-50 MCG/DOSE AEPB Inhale 1 puff into the lungs in the morning and at bedtime.  04/07/21 05/07/21  Sharen Hones, MD    Allergies as of 05/24/2021 - Review Complete 05/12/2021  Allergen Reaction Noted   Morphine and related Other (See Comments) 12/17/2012    Family History  Problem Relation Age of Onset   Breast cancer Neg Hx     Social History   Socioeconomic History   Marital status: Married    Spouse name: Not on file   Number of children: Not on file   Years of education: Not on file   Highest education level: Not on file  Occupational History   Not on file  Tobacco Use   Smoking status: Former    Packs/day: 0.50    Years: 45.00    Pack years: 22.50    Types: Cigarettes    Quit date: 03/11/2021    Years since quitting: 0.2   Smokeless tobacco: Never  Vaping Use   Vaping Use: Former  Substance and Sexual Activity   Alcohol use: Not Currently    Alcohol/week: 1.0 standard drink    Types: 1 Glasses of wine per week    Comment: Last drink in March   Drug use: No   Sexual activity: Not on file  Other Topics Concern   Not on file  Social History Narrative   Not on file   Social Determinants of Health   Financial Resource Strain: Not on file  Food Insecurity: Not on file  Transportation Needs: Not on file  Physical Activity: Not on file  Stress: Not on file  Social Connections: Not on file  Intimate Partner Violence: Not on file    Review of Systems: See HPI, otherwise negative ROS  Physical Exam: BP 140/89   Pulse 61   Temp (!) 96.9 F (36.1 C) (Temporal)   Resp 18   Ht 5\' 1"  (1.549 m)   Wt 63.5 kg   SpO2 100%   BMI 26.45 kg/m  General:   Alert,  pleasant and cooperative in NAD Head:  Normocephalic and atraumatic. Neck:  Supple; no masses or thyromegaly. Lungs:  Clear throughout to auscultation, normal respiratory effort.    Heart:  +S1, +S2, Regular rate and rhythm, No edema. Abdomen:  Soft, nontender and nondistended. Normal bowel sounds, without guarding, and without rebound.   Neurologic:  Alert and  oriented x4;   grossly normal neurologically.  Impression/Plan: Elaine Wright is here for an endoscopy  to be performed for  evaluation of dysphagia    Risks, benefits, limitations, and alternatives regarding endoscopy have been reviewed with the patient.  Questions have been answered.  All parties agreeable.   Jonathon Bellows, MD  05/28/2021, 10:43 AM

## 2021-05-28 NOTE — Op Note (Signed)
[facility] Gastroenterology Patient Name: Elaine Wright Procedure Date: 05/28/2021 10:42 AM MRN: [id number] Account #: [id number] Date of Birth: May 12, 1956 Admit Type: Outpatient Age: 65 Room: [facility] ENDO ROOM 4 Gender: Female Note Status: Finalized Procedure:             Upper GI endoscopy Indications:           Dysphagia Providers:             Jonathon Bellows MD, MD Referring MD:          Youlanda Roys. Lovie Macadamia, MD (Referring MD) Medicines:             Monitored Anesthesia Care Complications:         No immediate complications. Procedure:             Pre-Anesthesia Assessment:                        - Prior to the procedure, a History and Physical was                         performed, and patient medications, allergies and                         sensitivities were reviewed. The patient's tolerance                         of previous anesthesia was reviewed.                        - The risks and benefits of the procedure and the                         sedation options and risks were discussed with the                         patient. All questions were answered and informed                         consent was obtained.                        - ASA Grade Assessment: II - A patient with mild                         systemic disease.                        After obtaining informed consent, the endoscope was                         passed under direct vision. Throughout the procedure,                         the patient's blood pressure, pulse, and oxygen                         saturations were monitored continuously. The Endoscope                         was introduced through the mouth, and advanced  to the                         third part of duodenum. The upper GI endoscopy was                         accomplished with ease. The patient tolerated the                         procedure well. Findings:      The examined duodenum was normal.      A medium-sized hiatal  hernia was present.      The upper third of the esophagus and middle third of the esophagus were       normal.      LA Grade B (one or more mucosal breaks greater than 5 mm, not extending       between the tops of two mucosal folds) esophagitis with no bleeding was       found in the lower third of the esophagus. Biopsies were taken with a       cold forceps for histology. Impression:            - Normal examined duodenum.                        - Medium-sized hiatal hernia.                        - Normal upper third of esophagus and middle third of                         esophagus.                        - LA Grade B reflux esophagitis with no bleeding.                         Biopsied. Recommendation:        - Await pathology results.                        - Discharge patient to home (with escort).                        - Resume previous diet.                        - Use Prilosec (omeprazole) 40 mg PO BID for 3 months. Procedure Code(s):     --- Professional ---                        [phone], Esophagogastroduodenoscopy, flexible,                         transoral; with biopsy, single or multiple Diagnosis Code(s):     --- Professional ---                        K44.9, Diaphragmatic hernia without obstruction or                         gangrene  K21.00, Gastro-esophageal reflux disease with                         esophagitis, without bleeding                        R13.10, Dysphagia, unspecified CPT copyright 2019 American Medical Association. All rights reserved. The codes documented in this report are preliminary and upon coder review may  be revised to meet current compliance requirements. Jonathon Bellows, MD Jonathon Bellows MD, MD 05/28/2021 11:00:13 AM This report has been signed electronically. Number of Addenda: 0 Note Initiated On: 05/28/2021 10:42 AM Estimated Blood Loss:  Estimated blood loss: none.      [facility]

## 2021-05-29 ENCOUNTER — Encounter: Payer: Self-pay | Admitting: Gastroenterology

## 2021-05-29 LAB — SURGICAL PATHOLOGY

## 2021-05-30 ENCOUNTER — Encounter: Payer: Self-pay | Admitting: Gastroenterology

## 2021-06-04 ENCOUNTER — Other Ambulatory Visit: Payer: Self-pay

## 2021-06-04 ENCOUNTER — Ambulatory Visit: Payer: Medicare Other | Admitting: Pulmonary Disease

## 2021-06-04 ENCOUNTER — Encounter: Payer: Self-pay | Admitting: Pulmonary Disease

## 2021-06-04 VITALS — BP 108/78 | HR 65 | Temp 97.7°F | Ht 60.0 in | Wt 140.6 lb

## 2021-06-04 DIAGNOSIS — K2100 Gastro-esophageal reflux disease with esophagitis, without bleeding: Secondary | ICD-10-CM

## 2021-06-04 DIAGNOSIS — Z9861 Coronary angioplasty status: Secondary | ICD-10-CM

## 2021-06-04 DIAGNOSIS — I2510 Atherosclerotic heart disease of native coronary artery without angina pectoris: Secondary | ICD-10-CM

## 2021-06-04 DIAGNOSIS — J449 Chronic obstructive pulmonary disease, unspecified: Secondary | ICD-10-CM

## 2021-06-04 DIAGNOSIS — K449 Diaphragmatic hernia without obstruction or gangrene: Secondary | ICD-10-CM

## 2021-06-04 DIAGNOSIS — R0602 Shortness of breath: Secondary | ICD-10-CM

## 2021-06-04 NOTE — Patient Instructions (Signed)
We are scheduling breathing test  Stop taking Spiriva and Advair  You can continue using your emergency inhaler (albuterol) as needed  We will see you in follow-up in 3 months time call sooner should any new problems arise

## 2021-06-04 NOTE — Progress Notes (Signed)
Subjective:    Patient ID: Elaine Wright, female    DOB: 1956/10/22, 65 y.o.   MRN: [id number] Chief Complaint  Patient presents with   Follow-up    Hsp f/u   HPI The patient is a 65 year old recent former smoker (quit 3/27) who presents as a "hospital follow-up" this is a scheduled visit.  Her primary care physician is Dr. [name].  It appears that she had hospitalizations back-to-back for "COPD exacerbation".  First was on 24 March 2021 the patient had an acute MI and required endotracheal intubation.  She received a drug-eluting stent to the proximal LAD which was 80% stenosed.  She had elevated LVEDP noted on cath at that time.  She was noted at that time to have significant bronchospasm and poor air movement.  Was treated with diuretics and nebulizers as well as IV steroids.  She was extubated shortly after arrival to the ICU and transition to BiPAP.  She was sequently weaned off of BiPAP and subsequently discharged on 12 April on 2 L nasal cannula O2.  Subsequently, she was evaluated by Dr. Claudette Stapler during her April 21 hospitalization where she was requiring 2 L of oxygen at that time.  During that admission she was noted to have regurgitation of food which she actually endorse having issues with aspiration as well.  She has since been evaluated by gastroenterology and underwent EGD on 13 June which revealed a moderate hiatal hernia and was started on pantoprazole twice a day.  She states that she since starting metoprolol she has felt like a "new woman".  She had been using Advair and Spiriva inhalers but does not feel that these are making great significant change she feels pantoprazole has helped her the most.  She has albuterol as rescue but does not use it.  She has been weaned off of oxygen.  She does have nebulizer available at home for use if needed with DuoNeb.  She does not endorse any fevers, chills or sweats.  No orthopnea or paroxysmal nocturnal dyspnea.  She notes  throat irritation with Advair.   Review of Systems A 10 point review of systems was performed and it is as noted above otherwise negative.   Past Medical History:  Diagnosis Date   Arthritis    Diabetes mellitus without complication ([facility])    GERD (gastroesophageal reflux disease)    Sleep apnea    Past Surgical History:  Procedure Laterality Date   ABDOMINAL HYSTERECTOMY  2003   ANTERIOR AND POSTERIOR REPAIR N/A 04/14/2020   Procedure: ANTERIOR (CYSTOCELE) AND POSTERIOR REPAIR (RECTOCELE);  Surgeon: Schermerhorn, Gwen Her, MD;  Location: [facility];  Service: Gynecology;  Laterality: N/A;   CORONARY/GRAFT ACUTE MI REVASCULARIZATION N/A 03/24/2021   Procedure: Coronary/Graft Acute MI Revascularization;  Surgeon: Burnell Blanks, MD;  Location: [facility];  Service: Cardiovascular;  Laterality: N/A;   ESOPHAGOGASTRODUODENOSCOPY (EGD) WITH PROPOFOL N/A 05/28/2021   Procedure: ESOPHAGOGASTRODUODENOSCOPY (EGD) WITH PROPOFOL;  Surgeon: Jonathon Bellows, MD;  Location: [facility];  Service: Gastroenterology;  Laterality: N/A;   EYE SURGERY  2011   Cataract bil   LEFT HEART CATH AND CORONARY ANGIOGRAPHY N/A 03/24/2021   Procedure: LEFT HEART CATH AND CORONARY ANGIOGRAPHY;  Surgeon: Burnell Blanks, MD;  Location: [place];  Service: Cardiovascular;  Laterality: N/A;   PARTIAL KNEE ARTHROPLASTY  2009   right    Thumb joint     Tendon from arm to replace arthrititic thumb   TOTAL KNEE  ARTHROPLASTY  12/23/2012   Procedure: TOTAL KNEE ARTHROPLASTY;  Surgeon: Ninetta Lights, MD;  Location: [facility];  Service: Orthopedics;  Laterality: Left;   TRACHELECTOMY N/A 04/14/2020   Procedure: LAPAROSCOPIC TRACHELECTOMY;  Surgeon: Schermerhorn, Gwen Her, MD;  Location: [facility];  Service: Gynecology;  Laterality: N/A;   Family History  Problem Relation Age of Onset   Breast cancer Neg Hx    Social History   Tobacco Use   Smoking status: Former    Packs/day: 0.50     Years: 45.00    Pack years: 22.50    Types: Cigarettes    Quit date: 03/11/2021    Years since quitting: 0.2   Smokeless tobacco: Never  Substance Use Topics   Alcohol use: Not Currently    Alcohol/week: 1.0 standard drink    Types: 1 Glasses of wine per week    Comment: Last drink in March   Allergies  Allergen Reactions   Morphine And Related Other (See Comments)    Confusion, hallucinations   Current Meds  Medication Sig   alendronate (FOSAMAX) 70 MG tablet Take 70 mg by mouth every Friday.   aspirin 81 MG chewable tablet Chew 1 tablet (81 mg total) by mouth daily.   atorvastatin (LIPITOR) 80 MG tablet Take 1 tablet (80 mg total) by mouth daily.   Carboxymethylcellulose Sodium (THERATEARS OP) Place 1 drop into both eyes daily as needed (when using contacts).   dextromethorphan-guaiFENesin (MUCINEX DM) 30-600 MG 12hr tablet Take 1 tablet by mouth 2 (two) times daily as needed for cough.   metFORMIN (GLUCOPHAGE-XR) 500 MG 24 hr tablet Take 500 mg by mouth 2 (two) times daily.   metoprolol succinate (TOPROL-XL) 25 MG 24 hr tablet Take 1 tablet (25 mg total) by mouth daily.   metroNIDAZOLE (METROCREAM) 0.75 % cream Apply topically at bedtime. qhs to face for Rosacea   Multiple Vitamin (MULTI-VITAMIN) tablet Take 1 tablet by mouth daily.   pantoprazole (PROTONIX) 40 MG tablet Take 1 tablet (40 mg total) by mouth daily.   ticagrelor (BRILINTA) 90 MG TABS tablet Take 1 tablet (90 mg total) by mouth 2 (two) times daily.   tiotropium (SPIRIVA) 18 MCG inhalation capsule Place 1 capsule (18 mcg total) into inhaler and inhale daily.   Immunization History  Administered Date(s) Administered   Influenza, Seasonal, Injecte, Preservative Fre 08/16/2009   Influenza,inj,Quad PF,6+ Mos 11/02/2018   Influenza-Unspecified 12/20/2020   Moderna Sars-Covid-2 Vaccination 03/03/2020, 04/04/2020, 11/19/2020   Pneumococcal Polysaccharide-23 01/10/2021, 03/27/2021   Tetanus 01/16/2006        Objective:   Physical Exam BP 108/78 (BP Location: Left Arm, Patient Position: Sitting, Cuff Size: Normal)   Pulse 65   Temp 97.7 F (36.5 C) (Temporal)   Ht 5' (1.524 m)   Wt 140 lb 9.6 oz (63.8 kg)   SpO2 97%   BMI 27.46 kg/m  GENERAL: Well-developed, well-nourished woman, no acute distress.  Fully ambulatory.  No conversational dyspnea. HEAD: Normocephalic, atraumatic.  EYES: Pupils equal, round, reactive to light.  No scleral icterus.  MOUTH: Nose/mouth/throat not examined due to masking requirements for COVID 19. NECK: Supple. No thyromegaly. Trachea midline. No JVD.  No adenopathy. PULMONARY: Good air entry bilaterally.  No adventitious sounds. CARDIOVASCULAR: S1 and S2. Regular rate and rhythm.  No rubs, murmurs or gallops heard. ABDOMEN: Benign. MUSCULOSKELETAL: No joint deformity, no clubbing, no edema.  NEUROLOGIC: No focal deficit, no gait disturbance, speech is fluent.   SKIN: Intact,warm,dry. PSYCH: Mood and behavior normal  Assessment & Plan:     ICD-10-CM   1. COPD suggested by initial evaluation [facility])  J44.9 Pulmonary Function Test [facility] Only   Will obtain PFTs Use albuterol and DuoNeb as needed Hold off on Advair and Spiriva for now    2. Hiatal hernia with GERD and esophagitis  K44.9    K21.00    She had issues with aspiration due to this Continue pantoprazole Note that Fosamax can aggravate issues with this    3. SOB (shortness of breath)  R06.02    Resolved with management of her coronary artery disease and gastroesophageal reflux    4. CAD S/P percutaneous coronary angioplasty  I25.10    Z98.61    STEMI 03/2021 Status post PCI to LAD Followed by cardiology     Orders Placed This Encounter  Procedures   Pulmonary Function Test [facility] Only    Standing Status:   Future    Standing Expiration Date:   06/04/2022    Scheduling Instructions:     Next available    Order Specific Question:   Full PFT: includes the following: basic spirometry,  spirometry pre & post bronchodilator, diffusion capacity (DLCO), lung volumes    Answer:   Full PFT   Discussion:  Will assess the patient's pulmonary function with PFTs.  Currently we will stop Spiriva and Advair as the patient is noting more irritation with these medications and does not feel that they are really helping her much.  She will continue using rescue albuterol/DuoNeb as needed in the interim.  She notes that the things that have given her the most relief have been the coronary stent and the pantoprazole.  She will be seen in follow-up in 3 months time she is to contact us prior to that time should her symptoms exacerbate off of the inhalers or new issues arise.  Renold Don, MD [facility]   *This note was dictated using voice recognition software/Dragon.  Despite best efforts to proofread, errors can occur which can change the meaning.  Any change was purely unintentional.

## 2021-06-11 ENCOUNTER — Telehealth: Payer: Self-pay | Admitting: Pulmonary Disease

## 2021-06-11 MED ORDER — AZITHROMYCIN 250 MG PO TABS: ORAL_TABLET | ORAL | 0 refills | Status: AC

## 2021-06-11 MED ORDER — PREDNISONE 10 MG (21) PO TBPK: ORAL_TABLET | ORAL | 0 refills | Status: DC

## 2021-06-11 NOTE — Telephone Encounter (Addendum)
Spoke to patient, who reports of chest tightness, prod cough with white sputum, wheezing and increased sob x3d.  Spo2 is maintaining around  92% on roomair. She does not have supplemental oxygen.  Using albuterol HFA once daily with temporary relief. Maintenance inhaler on hold due to sore throat.  Denied fever, chills or sweats or additional sx.   Dr. Patsey Berthold, please advise. Thanks

## 2021-06-11 NOTE — Telephone Encounter (Signed)
Clarification of saturations are 92%.  Recommend prednisone taper pack of 21 tablets, 10 mg.  Follow directions in the package.  Azithromycin Z-Pak.  Use albuterol at least 4 times a day if she is unable to use maintenance inhaler.  If her symptoms worsen she needs to be seen either urgent care or ED.  If she develops fever she will need to be tested for COVID.

## 2021-06-11 NOTE — Telephone Encounter (Signed)
Patient is aware of below recommendations and voiced her understanding.  Rx for prednisone and zpak has been sent to preferred pharmacy.  Nothing further needed at this time.

## 2021-06-19 ENCOUNTER — Telehealth: Payer: Self-pay | Admitting: Pulmonary Disease

## 2021-06-19 MED ORDER — PREDNISONE 20 MG PO TABS
20.0000 mg | ORAL_TABLET | Freq: Every day | ORAL | 0 refills | Status: DC
Start: 2021-06-19 — End: 2021-06-28

## 2021-06-19 NOTE — Telephone Encounter (Signed)
Patient called in on 06/11/2021 and was prescribed prednisone dose pak and zpak.  She completed prednisone on Saturday and zpak on Thursday. Se reports of relief in  sx with prednisone and zpak. Once she coursed course of prednisone and abx, sx returned.  Patient reports chest tightness, productive cough with white sputum, wheezing and sob with exertion.  She is using albuterol neb 1-2 daily, albuterol HFA TID. She is not using Spiriva or Advair, as she was instructed to hold these medications at that last OV.   Dr. Mortimer Fries, please advise. Dr. Patsey Berthold is unavailable.

## 2021-06-19 NOTE — Telephone Encounter (Signed)
Patient is aware of below recommendations and voiced her understanding.  Rx for prednisone has been sent to preferred pharmacy.  Nothing further needed.

## 2021-06-21 ENCOUNTER — Other Ambulatory Visit: Payer: Self-pay

## 2021-06-21 ENCOUNTER — Ambulatory Visit (INDEPENDENT_AMBULATORY_CARE_PROVIDER_SITE_OTHER): Payer: Medicare Other | Admitting: Otolaryngology

## 2021-06-21 DIAGNOSIS — K115 Sialolithiasis: Secondary | ICD-10-CM

## 2021-06-21 DIAGNOSIS — M26609 Unspecified temporomandibular joint disorder, unspecified side: Secondary | ICD-10-CM

## 2021-06-21 NOTE — Progress Notes (Signed)
HPI: Elaine Wright is a 65 y.o. female who presents is referred by Jonathon Bellows, MD for evaluation of left submandibular duct stone.  Her complaints in the office today are more pain in the region of the TMJ joint when she yawns or opens her mouth widely.  She has had no swelling of the parotid gland.  She she has had no swelling of the semitubular gland on the left side.  She recently had a CT scan of her neck that demonstrated a 6 mm left submandibular duct stone.  This is otherwise asymptomatic.Marland Kitchen  Past Medical History:  Diagnosis Date   Arthritis    Diabetes mellitus without complication (HCC)    GERD (gastroesophageal reflux disease)    Sleep apnea    Past Surgical History:  Procedure Laterality Date   ABDOMINAL HYSTERECTOMY  2003   ANTERIOR AND POSTERIOR REPAIR N/A 04/14/2020   Procedure: ANTERIOR (CYSTOCELE) AND POSTERIOR REPAIR (RECTOCELE);  Surgeon: Schermerhorn, Gwen Her, MD;  Location: [facility];  Service: Gynecology;  Laterality: N/A;   CORONARY/GRAFT ACUTE MI REVASCULARIZATION N/A 03/24/2021   Procedure: Coronary/Graft Acute MI Revascularization;  Surgeon: Burnell Blanks, MD;  Location: [place] [facility];  Service: Cardiovascular;  Laterality: N/A;   ESOPHAGOGASTRODUODENOSCOPY (EGD) WITH PROPOFOL N/A 05/28/2021   Procedure: ESOPHAGOGASTRODUODENOSCOPY (EGD) WITH PROPOFOL;  Surgeon: Jonathon Bellows, MD;  Location: [facility];  Service: Gastroenterology;  Laterality: N/A;   EYE SURGERY  2011   Cataract bil   LEFT HEART CATH AND CORONARY ANGIOGRAPHY N/A 03/24/2021   Procedure: LEFT HEART CATH AND CORONARY ANGIOGRAPHY;  Surgeon: Burnell Blanks, MD;  Location: [place] [facility];  Service: Cardiovascular;  Laterality: N/A;   PARTIAL KNEE ARTHROPLASTY  2009   right    Thumb joint     Tendon from arm to replace arthrititic thumb   TOTAL KNEE ARTHROPLASTY  12/23/2012   Procedure: TOTAL KNEE ARTHROPLASTY;  Surgeon: Ninetta Lights, MD;  Location: [place];  Service:  Orthopedics;  Laterality: Left;   TRACHELECTOMY N/A 04/14/2020   Procedure: LAPAROSCOPIC TRACHELECTOMY;  Surgeon: Schermerhorn, Gwen Her, MD;  Location: [facility];  Service: Gynecology;  Laterality: N/A;   Social History   Socioeconomic History   Marital status: Married    Spouse name: Not on file   Number of children: Not on file   Years of education: Not on file   Highest education level: Not on file  Occupational History   Not on file  Tobacco Use   Smoking status: Former    Packs/day: 0.50    Years: 45.00    Pack years: 22.50    Types: Cigarettes    Quit date: 03/11/2021    Years since quitting: 0.2   Smokeless tobacco: Never  Vaping Use   Vaping Use: Former  Substance and Sexual Activity   Alcohol use: Not Currently    Alcohol/week: 1.0 standard drink    Types: 1 Glasses of wine per week    Comment: Last drink in March   Drug use: No   Sexual activity: Not on file  Other Topics Concern   Not on file  Social History Narrative   Not on file   Social Determinants of Health   Financial Resource Strain: Not on file  Food Insecurity: Not on file  Transportation Needs: Not on file  Physical Activity: Not on file  Stress: Not on file  Social Connections: Not on file   Family History  Problem Relation Age of Onset   Breast cancer  Neg Hx    Allergies  Allergen Reactions   Morphine And Related Other (See Comments)    Confusion, hallucinations   Prior to Admission medications   Medication Sig Start Date End Date Taking? Authorizing Provider  alendronate (FOSAMAX) 70 MG tablet Take 70 mg by mouth every Friday. 02/07/21   [provider]  aspirin 81 MG chewable tablet Chew 1 tablet (81 mg total) by mouth daily. 03/28/21   Sidney Ace, MD  atorvastatin (LIPITOR) 80 MG tablet Take 1 tablet (80 mg total) by mouth daily. 04/03/21 09/30/21  Loel Dubonnet, NP  Carboxymethylcellulose Sodium (THERATEARS OP) Place 1 drop into both eyes daily as needed (when  using contacts).    [provider]  dextromethorphan-guaiFENesin (MUCINEX DM) 30-600 MG 12hr tablet Take 1 tablet by mouth 2 (two) times daily as needed for cough.    [provider]  Fluticasone-Salmeterol (ADVAIR DISKUS) 250-50 MCG/DOSE AEPB Inhale 1 puff into the lungs in the morning and at bedtime. 04/07/21 05/07/21  Sharen Hones, MD  ipratropium-albuterol (DUONEB) 0.5-2.5 (3) MG/3ML SOLN Inhale 3 mLs into the lungs every 6 (six) hours as needed. Patient not taking: Reported on 06/04/2021 05/14/21   Bonnielee Haff, MD  metFORMIN (GLUCOPHAGE-XR) 500 MG 24 hr tablet Take 500 mg by mouth 2 (two) times daily. 03/04/20   [provider]  metoprolol succinate (TOPROL-XL) 25 MG 24 hr tablet Take 1 tablet (25 mg total) by mouth daily. 04/03/21 09/30/21  Loel Dubonnet, NP  metroNIDAZOLE (METROCREAM) 0.75 % cream Apply topically at bedtime. qhs to face for Rosacea 02/05/21 02/05/22  Brendolyn Patty, MD  Multiple Vitamin (MULTI-VITAMIN) tablet Take 1 tablet by mouth daily.    [provider]  pantoprazole (PROTONIX) 40 MG tablet Take 1 tablet (40 mg total) by mouth daily. 04/08/21   Sharen Hones, MD  predniSONE (DELTASONE) 20 MG tablet Take 1 tablet (20 mg total) by mouth daily with breakfast. 06/19/21   Flora Lipps, MD  ticagrelor (BRILINTA) 90 MG TABS tablet Take 1 tablet (90 mg total) by mouth 2 (two) times daily. 04/03/21 03/29/22  Loel Dubonnet, NP  tiotropium (SPIRIVA) 18 MCG inhalation capsule Place 1 capsule (18 mcg total) into inhaler and inhale daily. 05/14/21   Bonnielee Haff, MD     Positive ROS: Otherwise negative  All other systems have been reviewed and were otherwise negative with the exception of those mentioned in the HPI and as above.  Physical Exam: Constitutional: Alert, well-appearing, no acute distress Ears: External ears without lesions or tenderness. Ear canals are clear bilaterally with intact, clear TMs. Nasal: External nose without lesions.  Septum with minimal deformity. Clear nasal passages Oral: Lips and gums without lesions. Tongue and palate mucosa without lesions. Posterior oropharynx clear.  On palpation of the submandibular duct the stone is easily palpable and is slightly tender to palpation but there is no swelling no erythema.  Drainage from the duct is clear. Neck: No palpable adenopathy or masses.  There is no submandibular gland enlargement on the left side on palpation and no adenopathy.  The parotid gland is normal to palpation.  She has discomfort in the region of the left TMJ with obvious arthritis on opening and closing her mouth. Respiratory: Breathing comfortably  Skin: No facial/neck lesions or rash noted.  Procedures  Assessment: Her pain and discomfort is related to TMJ dysfunction. She has a 6 mm calcification within the left submandibular duct which is otherwise asymptomatic  Plan: Nothing needs to be  done with the submandibular duct stone unless it becomes symptomatic. I reviewed with her that her pain on opening and closing her mouth is related to the arthritis within the left TMJ joint.  We recommend soft diet and nonsteroidal anti-inflammatory medication to treat this.  Also suggested to follow-up with her dentist concerning this.   Radene Journey, MD   CC:

## 2021-06-28 ENCOUNTER — Telehealth: Payer: Self-pay | Admitting: Pulmonary Disease

## 2021-06-28 MED ORDER — FLUTICASONE-SALMETEROL 250-50 MCG/ACT IN AEPB: 1.0000 | INHALATION_SPRAY | Freq: Two times a day (BID) | RESPIRATORY_TRACT | 6 refills | Status: DC

## 2021-06-28 NOTE — Telephone Encounter (Signed)
Lm for patient.  

## 2021-06-28 NOTE — Telephone Encounter (Signed)
At her last visit she had requested to come off the inhalers as a trial.  Obviously she will need to continue taking these medications.  Not think that more prednisone and antibiotics are indicated and it could also cause her issues.  I would recommend starting Advair twice a day make sure she rinses her mouth well after use with some water with baking soda in it and resume Spiriva once a day.  Hopefully this will get her feeling better.  We will get her in sooner than September with either me or one of the nurse practitioners so that we can reevaluate her.

## 2021-06-28 NOTE — Telephone Encounter (Signed)
Patient is aware of recommendations and voiced her understanding.  Rx for Advair has been sent to preferred pharmacy per request. Appt scheduled for 07/31/2021 at 4:00. Nothing further needed.

## 2021-06-28 NOTE — Telephone Encounter (Signed)
Patient is returning phone call. Patient phone number is [phone].

## 2021-06-28 NOTE — Telephone Encounter (Signed)
Patient was prescribed prednisone and zpak on 06/11/2021. Patient called back on 06/19/2021 and was prescribed another round of prednisone. Completed course of prednisone over the weekend.  She stated that she had improvement in sx while taking prednisone and zpak, however once she finished the medications her sx worsened.  She reports of prod cough with clear sputum, wheezing when laying flat and sob with exertion. Denied fever, chills or sweats.  Maintenance inhalers are on hold per last OV.  She is using albuterol 1-3x daily and mucinex DM with no relief in sx.  Dr. Patsey Berthold, please advise. Thanks

## 2021-07-10 ENCOUNTER — Other Ambulatory Visit
Admission: RE | Admit: 2021-07-10 | Discharge: 2021-07-10 | Disposition: A | Discharge status: Home / Self Care | Payer: Medicare Other | Source: Ambulatory Visit | Attending: Pulmonary Disease | Admitting: Pulmonary Disease

## 2021-07-10 ENCOUNTER — Other Ambulatory Visit: Payer: Self-pay

## 2021-07-10 DIAGNOSIS — Z20822 Contact with and (suspected) exposure to covid-19: Secondary | ICD-10-CM | POA: Insufficient documentation

## 2021-07-10 DIAGNOSIS — Z01812 Encounter for preprocedural laboratory examination: Secondary | ICD-10-CM | POA: Diagnosis present

## 2021-07-10 LAB — SARS CORONAVIRUS 2 (TAT 6-24 HRS): SARS Coronavirus 2: NEGATIVE

## 2021-07-11 ENCOUNTER — Ambulatory Visit: Payer: Medicare Other | Attending: Pulmonary Disease

## 2021-07-11 DIAGNOSIS — F17210 Nicotine dependence, cigarettes, uncomplicated: Secondary | ICD-10-CM | POA: Insufficient documentation

## 2021-07-11 DIAGNOSIS — J449 Chronic obstructive pulmonary disease, unspecified: Secondary | ICD-10-CM | POA: Diagnosis present

## 2021-07-30 ENCOUNTER — Other Ambulatory Visit: Payer: Self-pay

## 2021-07-30 ENCOUNTER — Ambulatory Visit: Payer: Medicare Other | Admitting: Adult Health

## 2021-07-30 ENCOUNTER — Encounter: Payer: Self-pay | Admitting: Adult Health

## 2021-07-30 DIAGNOSIS — J432 Centrilobular emphysema: Secondary | ICD-10-CM | POA: Insufficient documentation

## 2021-07-30 NOTE — Progress Notes (Signed)
$'@Patient'y$  ID: Elaine Wright, female    DOB: [birth year], 65 y.o.   MRN: [id number]  Chief Complaint  Patient presents with   Follow-up    Referring provider: [name], MD  HPI: 64 yo former smoker seen for pulmonary consult 06/04/21  Admission 03/2021 acute MI -required intubation , COPD exacerbation , s/p DES to LAD .   TEST/EVENTS :  Chest xray 04/2021 clear lungs.   LDCT chest 01/2021- Mild Emphysema . Lung RADS 1   07/30/2021 Follow up : COPD Patient returns for 2 month follow up. Seen last office visit for pulmonary consult . Patient was admitted in April for COPD flare and MI . Required intubation and BIPAP . Discharged on Oxygen. She was to stop Advair and Spiriva . Use albuterol inhaler as needed. She was set up for PFT that normal lung function with FEV1 at 104%, ratio 83, FVC 96%, no sign BD response. DLCO 95%. Reviewed her results in detail .  She says she was unable to remain off inhalers that breathing got worse when went off them. She called in and was started on steroid taper. She restarted Wixela but caused irritation on throat .  Has also been doing spiriva Twice daily  . Denies fever, chest pain orthopnea or edema.    Allergies  Allergen Reactions   Morphine And Related Other (See Comments)    Confusion, hallucinations    Immunization History  Administered Date(s) Administered   Influenza, Seasonal, Injecte, Preservative Fre 08/16/2009   Influenza,inj,Quad PF,6+ Mos 11/02/2018   Influenza-Unspecified 12/20/2020   Moderna Sars-Covid-2 Vaccination 03/03/2020, 04/04/2020, 11/19/2020   Pneumococcal Polysaccharide-23 01/10/2021, 03/27/2021   Tetanus 01/16/2006    Past Medical History:  Diagnosis Date   Arthritis    Diabetes mellitus without complication (HCC)    GERD (gastroesophageal reflux disease)    Sleep apnea     Tobacco History: Social History   Tobacco Use  Smoking Status Former   Packs/day: 0.50   Years: 45.00   Pack years: 22.50    Types: Cigarettes   Quit date: 03/11/2021   Years since quitting: 0.3  Smokeless Tobacco Never   Counseling given: Not Answered   Outpatient Medications Prior to Visit  Medication Sig Dispense Refill   alendronate (FOSAMAX) 70 MG tablet Take 70 mg by mouth every Friday.     aspirin 81 MG chewable tablet Chew 1 tablet (81 mg total) by mouth daily.     atorvastatin (LIPITOR) 80 MG tablet Take 1 tablet (80 mg total) by mouth daily. 90 tablet 1   Carboxymethylcellulose Sodium (THERATEARS OP) Place 1 drop into both eyes daily as needed (when using contacts).     dextromethorphan-guaiFENesin (MUCINEX DM) 30-600 MG 12hr tablet Take 1 tablet by mouth 2 (two) times daily as needed for cough.     ipratropium-albuterol (DUONEB) 0.5-2.5 (3) MG/3ML SOLN Inhale 3 mLs into the lungs every 6 (six) hours as needed. 360 mL    metFORMIN (GLUCOPHAGE-XR) 500 MG 24 hr tablet Take 500 mg by mouth 2 (two) times daily.     metoprolol succinate (TOPROL-XL) 25 MG 24 hr tablet Take 1 tablet (25 mg total) by mouth daily. 90 tablet 1   metroNIDAZOLE (METROCREAM) 0.75 % cream Apply topically at bedtime. qhs to face for Rosacea 45 g 6   Multiple Vitamin (MULTI-VITAMIN) tablet Take 1 tablet by mouth daily.     pantoprazole (PROTONIX) 40 MG tablet Take 1 tablet (40 mg total) by mouth daily. 30 tablet 0  ticagrelor (BRILINTA) 90 MG TABS tablet Take 1 tablet (90 mg total) by mouth 2 (two) times daily. 60 tablet 11   fluticasone-salmeterol (ADVAIR DISKUS) 250-50 MCG/ACT AEPB Inhale 1 puff into the lungs in the morning and at bedtime. 60 each 6   tiotropium (SPIRIVA) 18 MCG inhalation capsule Place 1 capsule (18 mcg total) into inhaler and inhale daily. (Patient not taking: Reported on 07/30/2021) 30 capsule 12   No facility-administered medications prior to visit.     Review of Systems:   Constitutional:   No  weight loss, night sweats,  Fevers, chills,  +fatigue, or  lassitude.  HEENT:   No headaches,  Difficulty  swallowing,  Tooth/dental problems, or  Sore throat,                No sneezing, itching, ear ache, nasal congestion, post nasal drip,   CV:  No chest pain,  Orthopnea, PND, swelling in lower extremities, anasarca, dizziness, palpitations, syncope.   GI  No heartburn, indigestion, abdominal pain, nausea, vomiting, diarrhea, change in bowel habits, loss of appetite, bloody stools.   Resp: .  No excess mucus, no productive cough,  No non-productive cough,  No coughing up of blood.  No change in color of mucus.  No wheezing.  No chest wall deformity  Skin: no rash or lesions.  GU: no dysuria, change in color of urine, no urgency or frequency.  No flank pain, no hematuria   MS:  No joint pain or swelling.  No decreased range of motion.  No back pain.    Physical Exam  BP 110/60 (BP Location: Left Arm, Patient Position: Sitting, Cuff Size: Normal)   Pulse 63   Temp 98 F (36.7 C) (Oral)   Ht '5\' 5"'$  (1.651 m)   Wt 141 lb 1.6 oz (64 kg)   SpO2 97%   BMI 23.48 kg/m   GEN: A/Ox3; pleasant , NAD, well nourished    HEENT:  [place]/AT,  NOSE-clear, THROAT-clear, no lesions, no postnasal drip or exudate noted. No sign of thrush   NECK:  Supple w/ fair ROM; no JVD; normal carotid impulses w/o bruits; no thyromegaly or nodules palpated; no lymphadenopathy.    RESP  Clear  P & A; w/o, wheezes/ rales/ or rhonchi. no accessory muscle use, no dullness to percussion  CARD:  RRR, no m/r/g, no peripheral edema, pulses intact, no cyanosis or clubbing.  GI:   Soft & nt; nml bowel sounds; no organomegaly or masses detected.   Musco: Warm bil, no deformities or joint swelling noted.   Neuro: alert, no focal deficits noted.    Skin: Warm, no lesions or rashes    Lab Results:    BNP   ProBNP No results found for: PROBNP  Imaging: Pulmonary Function Test [facility] Only  Result Date: 07/12/2021 Spirometry Data Is Acceptable and Reproducible No obvious evidence of Obstructive Airways disease or  Restrictive Lung disease Consider outpatient follow up with Pulmonary if needed. Clinical Correlation Advised     No flowsheet data found.  No results found for: NITRICOXIDE      Assessment & Plan:   Centrilobular emphysema ([place]) Mild emphysema on CT chest . PFT show normal lung function .  Increased symptom burden off inhalers.  For now stop Wixela . Restart Spiriva, only once daily dosing .  Yearly LDCT chest   Plan  Patient Instructions  Remain off [place] .  Restart Spiriva 1 puff daily . Rinse after use.  Activity as tolerated.  Yearly LDCT Chest .  Follow up with Dr. Patsey Berthold in 6 months and As needed        I spent   30 minutes dedicated to the care of this patient on the date of this encounter to include pre-visit review of records, face-to-face time with the patient discussing conditions above, post visit ordering of testing, clinical documentation with the electronic health record, making appropriate referrals as documented, and communicating necessary findings to members of the patients care team.    Rexene Edison, NP 07/30/2021

## 2021-07-30 NOTE — Assessment & Plan Note (Signed)
Mild emphysema on CT chest . PFT show normal lung function .  Increased symptom burden off inhalers.  For now stop Wixela . Restart Spiriva, only once daily dosing .  Yearly LDCT chest   Plan  Patient Instructions  Remain off [place] .  Restart Spiriva 1 puff daily . Rinse after use.  Activity as tolerated.  Yearly LDCT Chest .  Follow up with Dr. Patsey Berthold in 6 months and As needed

## 2021-07-30 NOTE — Patient Instructions (Addendum)
Remain off [place] .  Restart Spiriva 1 puff daily . Rinse after use.  Activity as tolerated.  Yearly LDCT Chest .  Follow up with Dr. Patsey Berthold in 6 months and As needed

## 2021-07-31 ENCOUNTER — Ambulatory Visit: Payer: Medicare Other | Admitting: Adult Health

## 2021-07-31 NOTE — Progress Notes (Signed)
Cardiology Office Note:    Date:  08/01/2021   ID:  Elaine Wright, DOB 1956-03-05, MRN [id number]  PCP:  [name], MD  [facility] Cardiologist:  Nelva Bush, MD  [facility] Electrophysiologist:  None   Referring MD: [name], MD   Chief Complaint: 3 month follow-up  History of Present Illness:    Elaine Wright is a 65 y.o. female with a hx of with a hx of CAD s/p anterolateral STEMI with PCI/DES to ost/prox LAD, osteoarthritis, diabetes type 2, GERD, OSA on CPAP, hyperlipidemia, HFpEF, and prior tobacco use presents for follow-up.   She presented to [facility] ED 03/24/21 with SOB and chest pain and CODE STEMI was called. She was taken emergently to cath lab after intubation. Cath showed ostial proximal LAD 80% stenosis which was treated with DES. Echo showed LVEF 60-65%, no WMA, mild LVH, normal diastolic function. DAPT for 1 year was recommended.    She was seen in the office 04/03/21 and was doing well from a cardiac standpoint. Lasix was discontinued due to normal LVEF and no diastolic dysfunction.   Patient was hospitalized 04/05/22 for acute respiratory failure with hypoxia. It was felt condition was triggered by aspiration from GERD. Also with dysphagia. She was treated with oral steroids for COPD and scheduled with GI.   Last seen 04/30/21 and reported SOB. Suspected breathing COPD and recent hospitalizations. Also on brilinta. She was referred to cardiac rehab with plan to evaluate breathing at follow-up.   Today, the patient reports breathing is good. She had thrush last week and saw PCP, who gave her medication and restarted spiriva. Oxygen has been normal. She didn't do cardiac rehab. She works outside all the time. She goes to the gym 4 times a week No chest pain or shortness of breath with activity. No lower leg edema, orthopnea, pnd, palpitations. She has a CPAP that she uses regularly. BP good today, heart rate in the 50s, no lightheadedness or  dizziness.   Past Medical History:  Diagnosis Date   Arthritis    Diabetes mellitus without complication (HCC)    GERD (gastroesophageal reflux disease)    Sleep apnea     Past Surgical History:  Procedure Laterality Date   ABDOMINAL HYSTERECTOMY  2003   ANTERIOR AND POSTERIOR REPAIR N/A 04/14/2020   Procedure: ANTERIOR (CYSTOCELE) AND POSTERIOR REPAIR (RECTOCELE);  Surgeon: Schermerhorn, Gwen Her, MD;  Location: [facility] ORS;  Service: Gynecology;  Laterality: N/A;   CORONARY/GRAFT ACUTE MI REVASCULARIZATION N/A 03/24/2021   Procedure: Coronary/Graft Acute MI Revascularization;  Surgeon: Burnell Blanks, MD;  Location: [place] [facility];  Service: Cardiovascular;  Laterality: N/A;   ESOPHAGOGASTRODUODENOSCOPY (EGD) WITH PROPOFOL N/A 05/28/2021   Procedure: ESOPHAGOGASTRODUODENOSCOPY (EGD) WITH PROPOFOL;  Surgeon: Jonathon Bellows, MD;  Location: [facility] ENDOSCOPY;  Service: Gastroenterology;  Laterality: N/A;   EYE SURGERY  2011   Cataract bil   LEFT HEART CATH AND CORONARY ANGIOGRAPHY N/A 03/24/2021   Procedure: LEFT HEART CATH AND CORONARY ANGIOGRAPHY;  Surgeon: Burnell Blanks, MD;  Location: [place] [facility];  Service: Cardiovascular;  Laterality: N/A;   PARTIAL KNEE ARTHROPLASTY  2009   right    Thumb joint     Tendon from arm to replace arthrititic thumb   TOTAL KNEE ARTHROPLASTY  12/23/2012   Procedure: TOTAL KNEE ARTHROPLASTY;  Surgeon: Ninetta Lights, MD;  Location: [facility];  Service: Orthopedics;  Laterality: Left;   TRACHELECTOMY N/A 04/14/2020   Procedure: LAPAROSCOPIC TRACHELECTOMY;  Surgeon: Schermerhorn,  Gwen Her, MD;  Location: [facility] ORS;  Service: Gynecology;  Laterality: N/A;    Current Medications: No outpatient medications have been marked as taking for the 08/01/21 encounter (Appointment) with Kathlen Mody, Cylus [place] H, PA-C.     Allergies:   Morphine and related   Social History   Socioeconomic History   Marital status: Married    Spouse name: Not on file    Number of children: Not on file   Years of education: Not on file   Highest education level: Not on file  Occupational History   Not on file  Tobacco Use   Smoking status: Former    Packs/day: 0.50    Years: 45.00    Pack years: 22.50    Types: Cigarettes    Quit date: 03/11/2021    Years since quitting: 0.3   Smokeless tobacco: Never  Vaping Use   Vaping Use: Former  Substance and Sexual Activity   Alcohol use: Not Currently    Alcohol/week: 1.0 standard drink    Types: 1 Glasses of wine per week    Comment: Last drink in March   Drug use: No   Sexual activity: Not on file  Other Topics Concern   Not on file  Social History Narrative   Not on file   Social Determinants of Health   Financial Resource Strain: Not on file  Food Insecurity: Not on file  Transportation Needs: Not on file  Physical Activity: Not on file  Stress: Not on file  Social Connections: Not on file     Family History: The patient's family history is negative for Breast cancer.  ROS:   Please see the history of present illness.     All other systems reviewed and are negative.  EKGs/Labs/Other Studies Reviewed:    The following studies were reviewed today:  Cardiac Catheterization 03/24/21 Conclusion Ost LAD to Prox LAD lesion is 80% stenosed. A drug-eluting stent was successfully placed using a STENT SYNERGY DES 3X12. Post intervention, there is a 0% residual stenosis.   1. Severe proximal LAD stenosis best seen in the LAO view. This was confirmed with IVUS imaging. She likely became ischemic from this lesion when she became hypoxic and hypertensive at home.  2. Successful PTCA/DES x 1 proximal LAD 3. No obstructive disease in the left main, Circumflex or RCA 4. Elevated LVEDP   Recommendations: Will admit to the ICU on the critical care team. She will remain intubated while her respiratory status is optimized. Continue Aggrastat drip for 2 hours post PCI. Will continue DAPT with ASA and  Brilinta for at least one year. I will start a high intensity statin. One dose IV Lasix this am. Echo later today.    Coronary Diagrams Diagnostic Dominance: Right     Intervention       _____________   2D Echocardiogram 03/25/2021: 1. Left ventricular ejection fraction, by estimation, is 60 to 65%. The  left ventricle has normal function. The left ventricle has no regional  wall motion abnormalities. There is mild concentric left ventricular  hypertrophy. Left ventricular diastolic  parameters were normal.   2. Right ventricular systolic function is normal. The right ventricular  size is normal.   3. The mitral valve is normal in structure. Trivial mitral valve  regurgitation. No evidence of mitral stenosis.   4. The aortic valve is tricuspid. Aortic valve regurgitation is not  visualized. No aortic stenosis is present.   EKG:  EKG is not ordered today.   Recent  Labs: 03/27/2021: Magnesium 2.0 05/12/2021: B Natriuretic Peptide 12.3 05/13/2021: ALT 26 05/14/2021: BUN 18; Creatinine, Ser 0.65; Hemoglobin 12.6; Platelets 271; Potassium 4.8; Sodium 140  Recent Lipid Panel    Component Value Date/Time   CHOL 142 03/26/2021 0455   TRIG 61 03/26/2021 0455   HDL 62 03/26/2021 0455   CHOLHDL 2.3 03/26/2021 0455   VLDL 12 03/26/2021 0455   LDLCALC 68 03/26/2021 0455    Physical Exam:    VS:  There were no vitals taken for this visit.    Wt Readings from Last 3 Encounters:  07/30/21 141 lb 1.6 oz (64 kg)  06/04/21 140 lb 9.6 oz (63.8 kg)  05/28/21 140 lb (63.5 kg)     GEN:  Well nourished, well developed in no acute distress HEENT: Normal NECK: No JVD; No carotid bruits LYMPHATICS: No lymphadenopathy CARDIAC: SB, RR, no murmurs, rubs, gallops RESPIRATORY:  Clear to auscultation without rales, wheezing or rhonchi  ABDOMEN: Soft, non-tender, non-distended MUSCULOSKELETAL:  No edema; No deformity  SKIN: Warm and dry NEUROLOGIC:  Alert and oriented x 3 PSYCHIATRIC:  Normal  affect   ASSESSMENT:    1. Coronary artery disease of native artery of native heart with stable angina pectoris ([place])   2. Hyperlipidemia LDL goal <70   3. Chronic diastolic heart failure (HCC)   4. Pulmonary emphysema, unspecified emphysema type ([place])   5. Tobacco use    PLAN:    In order of problems listed above:  CAD s/p anterolateral STEMI with PCI/DES to ost/prox LAD Patient did not do cardiac rehab. She reports no anginal symptoms, breathing is better. She does regularly activity/exercise. Since breathing is better will not switch Brilinta to Plavix.  Continue Aspirin, Brilinta, statin, BB.   COPD Prior tobacco use Recently saw PCP and was restarted on Spiriva. Breathing is at baseline.  HFpEF Previously taken off Lasix due to euvolemia and normal diastolic function. She is euvolemic on exam.   HTN BP normotensive. Continue Toprol '25mg'$  daily. HR 55bpm. She is asymptomatic. Reassess heart rate at follow-up.   HLD LDL 68 03/2021. Continue statin.   Disposition: Follow up in 3 month(s) with MD/APP   Signed, Marquin Patino Ninfa Meeker, PA-C  08/01/2021 7:49 AM     [facility]

## 2021-08-01 ENCOUNTER — Other Ambulatory Visit: Payer: Self-pay

## 2021-08-01 ENCOUNTER — Encounter: Payer: Self-pay | Admitting: Medical

## 2021-08-01 ENCOUNTER — Ambulatory Visit: Payer: Medicare Other | Admitting: Medical

## 2021-08-01 VITALS — BP 122/70 | HR 55 | Ht 61.0 in | Wt 142.4 lb

## 2021-08-01 DIAGNOSIS — J439 Emphysema, unspecified: Secondary | ICD-10-CM | POA: Diagnosis not present

## 2021-08-01 DIAGNOSIS — I5032 Chronic diastolic (congestive) heart failure: Secondary | ICD-10-CM

## 2021-08-01 DIAGNOSIS — E785 Hyperlipidemia, unspecified: Secondary | ICD-10-CM | POA: Diagnosis not present

## 2021-08-01 DIAGNOSIS — I25118 Atherosclerotic heart disease of native coronary artery with other forms of angina pectoris: Secondary | ICD-10-CM | POA: Diagnosis not present

## 2021-08-01 DIAGNOSIS — Z720 Tobacco use: Secondary | ICD-10-CM

## 2021-08-01 NOTE — Patient Instructions (Signed)
Medication Instructions:  No changes at this time.  *If you need a refill on your cardiac medications before your next appointment, please call your pharmacy*   Lab Work: None  If you have labs (blood work) drawn today and your tests are completely normal, you will receive your results only by: [place] (if you have MyChart) OR A paper copy in the mail If you have any lab test that is abnormal or we need to change your treatment, we will call you to review the results.   Testing/Procedures: None   Follow-Up: At [facility], you and your health needs are our priority.  As part of our continuing mission to provide you with exceptional heart care, we have created designated Provider Care Teams.  These Care Teams include your primary Cardiologist (physician) and Advanced Practice Providers (APPs -  Physician Assistants and Nurse Practitioners) who all work together to provide you with the care you need, when you need it.   Your next appointment:   3 month(s)  The format for your next appointment:   In Person  Provider:   You may see Nelva Bush, MD or one of the following Advanced Practice Providers on your designated Care Team:   Murray Hodgkins, NP Christell Faith, PA-C Marrianne Mood, PA-C [place], [place]

## 2021-08-06 ENCOUNTER — Telehealth: Payer: Self-pay | Admitting: Adult Health

## 2021-08-06 MED ORDER — AZITHROMYCIN 250 MG PO TABS: ORAL_TABLET | ORAL | 0 refills | Status: DC

## 2021-08-06 MED ORDER — PREDNISONE 20 MG PO TABS: 20.0000 mg | ORAL_TABLET | Freq: Every day | ORAL | 0 refills | Status: DC

## 2021-08-06 NOTE — Telephone Encounter (Signed)
Spoke to patient, who reports of chest tightness, wheezing, prod cough with white sputum and increased sob with exertion and laying flat x2d. Using albuterol HFA Q4H, spiriva handihaler once daily and mucinex DM BID.  She does not wear supplemental oxygen. Spo2 maintaining between 89%-95% at rest.  Negative home covid test one week ago prior to sx starting because she had thrush. She has not taken a covid test since sx started.   Dr. Mortimer Fries, please advise. Dr. Patsey Berthold is unavailable. Thanks

## 2021-08-06 NOTE — Telephone Encounter (Signed)
Patient is aware of recommendations and voiced her understanding.  Rx for prednisone and zpak has been sent to preferred pharmacy. Nothing further needed at this time.   

## 2021-08-06 NOTE — Telephone Encounter (Signed)
Lm for patient.  

## 2021-08-10 MED ORDER — PREDNISONE 20 MG PO TABS: 20.0000 mg | ORAL_TABLET | Freq: Every day | ORAL | 0 refills | Status: DC

## 2021-08-10 NOTE — Addendum Note (Signed)
Addended by: Claudette Head A on: 08/10/2021 03:11 PM   Modules accepted: Orders

## 2021-08-10 NOTE — Telephone Encounter (Signed)
Patient stated that she completed course of zpak with no relief in sx. She feels that sx have slightly worsened.  She has one more dose of prednisone left.  C/o dry cough, sob with exertion and some wheezing. Denied c/f/s or additional sx. No recent covid test. Recommended covid test. She is taking mucinex MD BID, spiriva daily and duoneb Q4H.  Fully vaccinated against covid.  Dr. Mortimer Fries, please advise. Thanks

## 2021-08-10 NOTE — Telephone Encounter (Signed)
Patient is aware of recommendations and voiced her understanding.  Prednisone has been sent to preferred pharmacy. Nothing further needed.

## 2021-08-23 ENCOUNTER — Other Ambulatory Visit: Payer: Self-pay

## 2021-08-23 ENCOUNTER — Encounter: Payer: Medicare Other | Attending: Internal Medicine | Admitting: *Deleted

## 2021-08-23 ENCOUNTER — Encounter: Payer: Self-pay | Admitting: *Deleted

## 2021-08-23 DIAGNOSIS — I213 ST elevation (STEMI) myocardial infarction of unspecified site: Secondary | ICD-10-CM

## 2021-08-23 DIAGNOSIS — Z955 Presence of coronary angioplasty implant and graft: Secondary | ICD-10-CM | POA: Insufficient documentation

## 2021-08-23 NOTE — Progress Notes (Signed)
Virtual orientation call completed today. shehas an appointment on Date: 09/06/2021  for EP eval and gym Orientation.  Documentation of diagnosis can be found in [facility]  Date: 03/24/21 .

## 2021-08-27 NOTE — Progress Notes (Signed)
Agree with the details of the visit as noted by Tammy Parrett, NP.  C. Laura Chadley Dziedzic, MD [place] [facility] 

## 2021-09-06 ENCOUNTER — Encounter: Payer: Medicare Other | Admitting: *Deleted

## 2021-09-06 ENCOUNTER — Other Ambulatory Visit: Payer: Self-pay

## 2021-09-06 VITALS — Ht 62.0 in | Wt 143.3 lb

## 2021-09-06 DIAGNOSIS — Z955 Presence of coronary angioplasty implant and graft: Secondary | ICD-10-CM

## 2021-09-06 DIAGNOSIS — I213 ST elevation (STEMI) myocardial infarction of unspecified site: Secondary | ICD-10-CM | POA: Diagnosis present

## 2021-09-06 NOTE — Progress Notes (Signed)
Cardiac Individual Treatment Plan  Patient Details  Name: Elaine Wright MRN: [id number] Date of Birth: September 01, 1956 Referring Provider:   Flowsheet Row Cardiac Rehab from 09/06/2021 in [facility] Cardiac and Pulmonary Rehab  Referring Provider End, Harrell Gave MD       Initial Encounter Date:  Flowsheet Row Cardiac Rehab from 09/06/2021 in [facility] Cardiac and Pulmonary Rehab  Date 09/06/21       Visit Diagnosis: ST elevation myocardial infarction (STEMI), unspecified artery [facility])  Status post coronary artery stent placement  Patient's Home Medications on Admission:  Current Outpatient Medications:    albuterol (VENTOLIN HFA) 108 (90 Base) MCG/ACT inhaler, Inhale 2 puffs into the lungs every 4 (four) hours as needed for wheezing or shortness of breath., Disp: , Rfl:    alendronate (FOSAMAX) 70 MG tablet, Take 70 mg by mouth every Friday., Disp: , Rfl:    aspirin 81 MG chewable tablet, Chew 1 tablet (81 mg total) by mouth daily., Disp: , Rfl:    atorvastatin (LIPITOR) 80 MG tablet, Take 1 tablet (80 mg total) by mouth daily., Disp: 90 tablet, Rfl: 1   benzonatate (TESSALON) 100 MG capsule, Take 100 mg by mouth 3 (three) times daily., Disp: , Rfl:    Carboxymethylcellulose Sodium (THERATEARS OP), Place 1 drop into both eyes daily as needed (when using contacts)., Disp: , Rfl:    dextromethorphan-guaiFENesin (MUCINEX DM) 30-600 MG 12hr tablet, Take 1 tablet by mouth 2 (two) times daily as needed for cough., Disp: , Rfl:    ipratropium-albuterol (DUONEB) 0.5-2.5 (3) MG/3ML SOLN, Inhale 3 mLs into the lungs every 6 (six) hours as needed., Disp: 360 mL, Rfl:    metFORMIN (GLUCOPHAGE-XR) 500 MG 24 hr tablet, Take 500 mg by mouth 2 (two) times daily., Disp: , Rfl:    metoprolol succinate (TOPROL-XL) 25 MG 24 hr tablet, Take 1 tablet (25 mg total) by mouth daily., Disp: 90 tablet, Rfl: 1   metroNIDAZOLE (METROCREAM) 0.75 % cream, Apply topically at bedtime. qhs to face for Rosacea, Disp: 45 g, Rfl:  6   Multiple Vitamin (MULTI-VITAMIN) tablet, Take 1 tablet by mouth daily., Disp: , Rfl:    nystatin (MYCOSTATIN) 100000 UNIT/ML suspension, Take 5 mLs by mouth 4 (four) times daily., Disp: , Rfl:    pantoprazole (PROTONIX) 40 MG tablet, Take 1 tablet (40 mg total) by mouth daily., Disp: 30 tablet, Rfl: 0   predniSONE (DELTASONE) 20 MG tablet, Take 1 tablet (20 mg total) by mouth daily with breakfast., Disp: 5 tablet, Rfl: 0   ticagrelor (BRILINTA) 90 MG TABS tablet, Take 1 tablet (90 mg total) by mouth 2 (two) times daily., Disp: 60 tablet, Rfl: 11   tiotropium (SPIRIVA) 18 MCG inhalation capsule, Place 1 capsule (18 mcg total) into inhaler and inhale daily., Disp: 30 capsule, Rfl: 12  Past Medical History: Past Medical History:  Diagnosis Date   Arthritis    Diabetes mellitus without complication (HCC)    GERD (gastroesophageal reflux disease)    Sleep apnea     Tobacco Use: Social History   Tobacco Use  Smoking Status Former   Packs/day: 0.50   Years: 45.00   Pack years: 22.50   Types: Cigarettes   Quit date: 03/11/2021   Years since quitting: 0.4  Smokeless Tobacco Never    Labs: Recent Review Flowsheet Data     Labs for ITP Cardiac and Pulmonary Rehab Latest Ref Rng & Units 03/24/2021 03/26/2021 05/13/2021   Cholestrol 0 - 200 mg/dL - 142 -  LDLCALC 0 - 99 mg/dL - 68 -   HDL >40 mg/dL - 62 -   Trlycerides <150 mg/dL - 61 -   Hemoglobin A1c 4.8 - 5.6 % 6.9(H) - 7.6(H)   PHART 7.350 - 7.450 7.25(L) - -   PCO2ART 32.0 - 48.0 mmHg 59(H) - -   HCO3 20.0 - 28.0 mmol/L 25.9 - -   ACIDBASEDEF 0.0 - 2.0 mmol/L 2.4(H) - -   O2SAT % 99.5 - -        Exercise Target Goals: Exercise Program Goal: Individual exercise prescription set using results from initial 6 min walk test and THRR while considering  patient's activity barriers and safety.   Exercise Prescription Goal: Initial exercise prescription builds to 30-45 minutes a day of aerobic activity, 2-3 days per week.  Home  exercise guidelines will be given to patient during program as part of exercise prescription that the participant will acknowledge.   Education: Aerobic Exercise: - Group verbal and visual presentation on the components of exercise prescription. Introduces F.I.T.T principle from ACSM for exercise prescriptions.  Reviews F.I.T.T. principles of aerobic exercise including progression. Written material given at graduation.   Education: Resistance Exercise: - Group verbal and visual presentation on the components of exercise prescription. Introduces F.I.T.T principle from ACSM for exercise prescriptions  Reviews F.I.T.T. principles of resistance exercise including progression. Written material given at graduation.    Education: Exercise & Equipment Safety: - Individual verbal instruction and demonstration of equipment use and safety with use of the equipment. Flowsheet Row Cardiac Rehab from 09/06/2021 in [facility] Cardiac and Pulmonary Rehab  Date 09/06/21  Educator [facility]  Instruction Review Code 1- Verbalizes Understanding       Education: Exercise Physiology & General Exercise Guidelines: - Group verbal and written instruction with models to review the exercise physiology of the cardiovascular system and associated critical values. Provides general exercise guidelines with specific guidelines to those with heart or lung disease.  Flowsheet Row Cardiac Rehab from 09/06/2021 in [facility] Cardiac and Pulmonary Rehab  Education need identified 09/06/21       Education: Flexibility, Balance, Mind/Body Relaxation: - Group verbal and visual presentation with interactive activity on the components of exercise prescription. Introduces F.I.T.T principle from ACSM for exercise prescriptions. Reviews F.I.T.T. principles of flexibility and balance exercise training including progression. Also discusses the mind body connection.  Reviews various relaxation techniques to help reduce and manage stress (i.e. Deep  breathing, progressive muscle relaxation, and visualization). Balance handout provided to take home. Written material given at graduation.   Activity Barriers & Risk Stratification:  Activity Barriers & Cardiac Risk Stratification - 09/06/21 0933       Activity Barriers & Cardiac Risk Stratification   Activity Barriers Arthritis;Left Knee Replacement;Right Knee Replacement;Deconditioning    Comments arthritis neck down on left side, R knee is a partial    Cardiac Risk Stratification High             6 Minute Walk:  6 Minute Walk     Row Name 09/06/21 0931         6 Minute Walk   Phase Initial     Distance 1353 feet     Walk Time 6 minutes     # of Rest Breaks 0     MPH 2.56     METS 3.09     RPE 9     VO2 Peak 10.82     Symptoms No     Resting HR 62 bpm  Resting BP 122/62     Resting Oxygen Saturation  96 %     Exercise Oxygen Saturation  during 6 min walk 98 %     Max Ex. HR 85 bpm     Max Ex. BP 132/70     2 Minute Post BP 126/70              Oxygen Initial Assessment:   Oxygen Re-Evaluation:   Oxygen Discharge (Final Oxygen Re-Evaluation):   Initial Exercise Prescription:  Initial Exercise Prescription - 09/06/21 0900       Date of Initial Exercise RX and Referring Provider   Date 09/06/21    Referring Provider End, Harrell Gave MD      Oxygen   Maintain Oxygen Saturation 88% or higher      Treadmill   MPH 2.6    Grade 1    Minutes 15    METs 3.35      Elliptical   Level 1    Speed 2.7    Minutes 15    METs 3      REL-XR   Level 3    Speed 50    Minutes 15    METs 3      Prescription Details   Frequency (times per week) 2    Duration Progress to 30 minutes of continuous aerobic without signs/symptoms of physical distress      Intensity   THRR 40-80% of Max Heartrate 99-136    Ratings of Perceived Exertion 11-13    Perceived Dyspnea 0-4      Progression   Progression Continue to progress workloads to maintain  intensity without signs/symptoms of physical distress.      Resistance Training   Training Prescription Yes    Weight 3lb    Reps 10-15             Perform Capillary Blood Glucose checks as needed.  Exercise Prescription Changes:   Exercise Prescription Changes     Row Name 09/06/21 0900             Response to Exercise   Blood Pressure (Admit) 122/62       Blood Pressure (Exercise) 132/70       Blood Pressure (Exit) 126/70       Heart Rate (Admit) 62 bpm       Heart Rate (Exercise) 85 bpm       Heart Rate (Exit) 66 bpm       Oxygen Saturation (Admit) 96 %       Oxygen Saturation (Exercise) 98 %       Rating of Perceived Exertion (Exercise) 9       Symptoms none       Comments walk test results                Exercise Comments:   Exercise Goals and Review:   Exercise Goals     Row Name 09/06/21 0935             Exercise Goals   Increase Physical Activity Yes       Intervention Provide advice, education, support and counseling about physical activity/exercise needs.;Develop an individualized exercise prescription for aerobic and resistive training based on initial evaluation findings, risk stratification, comorbidities and participant's personal goals.       Expected Outcomes Short Term: Attend rehab on a regular basis to increase amount of physical activity.;Long Term: Add in home exercise to make exercise part of routine and to increase  amount of physical activity.;Long Term: Exercising regularly at least 3-5 days a week.       Increase Strength and Stamina Yes       Intervention Provide advice, education, support and counseling about physical activity/exercise needs.;Develop an individualized exercise prescription for aerobic and resistive training based on initial evaluation findings, risk stratification, comorbidities and participant's personal goals.       Expected Outcomes Short Term: Increase workloads from initial exercise prescription for  resistance, speed, and METs.;Short Term: Perform resistance training exercises routinely during rehab and add in resistance training at home;Long Term: Improve cardiorespiratory fitness, muscular endurance and strength as measured by increased METs and functional capacity (6MWT)       Able to understand and use rate of perceived exertion (RPE) scale Yes       Intervention Provide education and explanation on how to use RPE scale       Expected Outcomes Short Term: Able to use RPE daily in rehab to express subjective intensity level;Long Term:  Able to use RPE to guide intensity level when exercising independently       Able to understand and use Dyspnea scale Yes       Intervention Provide education and explanation on how to use Dyspnea scale       Expected Outcomes Long Term: Able to use Dyspnea scale to guide intensity level when exercising independently;Short Term: Able to use Dyspnea scale daily in rehab to express subjective sense of shortness of breath during exertion       Knowledge and understanding of Target Heart Rate Range (THRR) Yes       Intervention Provide education and explanation of THRR including how the numbers were predicted and where they are located for reference       Expected Outcomes Short Term: Able to state/look up THRR;Short Term: Able to use daily as guideline for intensity in rehab;Long Term: Able to use THRR to govern intensity when exercising independently       Able to check pulse independently Yes       Intervention Provide education and demonstration on how to check pulse in carotid and radial arteries.;Review the importance of being able to check your own pulse for safety during independent exercise       Expected Outcomes Short Term: Able to explain why pulse checking is important during independent exercise;Long Term: Able to check pulse independently and accurately       Understanding of Exercise Prescription Yes       Intervention Provide education, explanation,  and written materials on patient's individual exercise prescription       Expected Outcomes Short Term: Able to explain program exercise prescription;Long Term: Able to explain home exercise prescription to exercise independently                Exercise Goals Re-Evaluation :   Discharge Exercise Prescription (Final Exercise Prescription Changes):  Exercise Prescription Changes - 09/06/21 0900       Response to Exercise   Blood Pressure (Admit) 122/62    Blood Pressure (Exercise) 132/70    Blood Pressure (Exit) 126/70    Heart Rate (Admit) 62 bpm    Heart Rate (Exercise) 85 bpm    Heart Rate (Exit) 66 bpm    Oxygen Saturation (Admit) 96 %    Oxygen Saturation (Exercise) 98 %    Rating of Perceived Exertion (Exercise) 9    Symptoms none    Comments walk test results  Nutrition:  Target Goals: Understanding of nutrition guidelines, daily intake of sodium 1500mg , cholesterol 200mg , calories 30% from fat and 7% or less from saturated fats, daily to have 5 or more servings of fruits and vegetables.  Education: All About Nutrition: -Group instruction provided by verbal, written material, interactive activities, discussions, models, and posters to present general guidelines for heart healthy nutrition including fat, fiber, MyPlate, the role of sodium in heart healthy nutrition, utilization of the nutrition label, and utilization of this knowledge for meal planning. Follow up email sent as well. Written material given at graduation. Flowsheet Row Cardiac Rehab from 09/06/2021 in [facility] Cardiac and Pulmonary Rehab  Education need identified 09/06/21       Biometrics:  Pre Biometrics - 09/06/21 0935       Pre Biometrics   Height 5\' 2"  (1.575 m)    Weight 143 lb 4.8 oz (65 kg)    BMI (Calculated) 26.2    Single Leg Stand 30 seconds              Nutrition Therapy Plan and Nutrition Goals:  Nutrition Therapy & Goals - 09/06/21 0936       Intervention  Plan   Intervention Prescribe, educate and counsel regarding individualized specific dietary modifications aiming towards targeted core components such as weight, hypertension, lipid management, diabetes, heart failure and other comorbidities.    Expected Outcomes Short Term Goal: Understand basic principles of dietary content, such as calories, fat, sodium, cholesterol and nutrients.;Short Term Goal: A plan has been developed with personal nutrition goals set during dietitian appointment.;Long Term Goal: Adherence to prescribed nutrition plan.             Nutrition Assessments:  MEDIFICTS Score Key: ?70 Need to make dietary changes  40-70 Heart Healthy Diet ? 40 Therapeutic Level Cholesterol Diet  Flowsheet Row Cardiac Rehab from 09/06/2021 in [facility] Cardiac and Pulmonary Rehab  Picture Your Plate Total Score on Admission 73      Picture Your Plate Scores: <87 Unhealthy dietary pattern with much room for improvement. 41-50 Dietary pattern unlikely to meet recommendations for good health and room for improvement. 51-60 More healthful dietary pattern, with some room for improvement.  >60 Healthy dietary pattern, although there may be some specific behaviors that could be improved.    Nutrition Goals Re-Evaluation:   Nutrition Goals Discharge (Final Nutrition Goals Re-Evaluation):   Psychosocial: Target Goals: Acknowledge presence or absence of significant depression and/or stress, maximize coping skills, provide positive support system. Participant is able to verbalize types and ability to use techniques and skills needed for reducing stress and depression.   Education: Stress, Anxiety, and Depression - Group verbal and visual presentation to define topics covered.  Reviews how body is impacted by stress, anxiety, and depression.  Also discusses healthy ways to reduce stress and to treat/manage anxiety and depression.  Written material given at graduation.   Education: Sleep  Hygiene -Provides group verbal and written instruction about how sleep can affect your health.  Define sleep hygiene, discuss sleep cycles and impact of sleep habits. Review good sleep hygiene tips.    Initial Review & Psychosocial Screening:  Initial Psych Review & Screening - 08/23/21 1349       Initial Review   Current issues with None Identified      Family Dynamics   Good Support System? Yes   husband, daughter, grandkids, friends, people at the gym   Concerns Recent loss of significant other    Comments son in  law- ex.  recent death      Barriers   Psychosocial barriers to participate in program There are no identifiable barriers or psychosocial needs.      Screening Interventions   Interventions Encouraged to exercise;To provide support and resources with identified psychosocial needs;Provide feedback about the scores to participant    Expected Outcomes Short Term goal: Utilizing psychosocial counselor, staff and physician to assist with identification of specific Stressors or current issues interfering with healing process. Setting desired goal for each stressor or current issue identified.;Long Term Goal: Stressors or current issues are controlled or eliminated.;Short Term goal: Identification and review with participant of any Quality of Life or Depression concerns found by scoring the questionnaire.;Long Term goal: The participant improves quality of Life and PHQ9 Scores as seen by post scores and/or verbalization of changes             Quality of Life Scores:   Quality of Life - 09/06/21 0936       Quality of Life   Select Quality of Life   updated in person     Quality of Life Scores   Health/Function Pre 28 %    Socioeconomic Pre 29.64 %    Psych/Spiritual Pre 29.14 %    Family Pre 30 %    GLOBAL Pre 28.87 %            Scores of 19 and below usually indicate a poorer quality of life in these areas.  A difference of  2-3 points is a clinically meaningful  difference.  A difference of 2-3 points in the total score of the Quality of Life Index has been associated with significant improvement in overall quality of life, self-image, physical symptoms, and general health in studies assessing change in quality of life.  PHQ-9: Recent Review Flowsheet Data     Depression screen [facility] 2/9 09/06/2021   Decreased Interest 0   Down, Depressed, Hopeless 1   PHQ - 2 Score 1   Altered sleeping 2   Tired, decreased energy 2   Change in appetite 0   Feeling bad or failure about yourself  0   Trouble concentrating 0   Moving slowly or fidgety/restless 0   Suicidal thoughts 0   PHQ-9 Score 5   Difficult doing work/chores Somewhat difficult      Interpretation of Total Score  Total Score Depression Severity:  1-4 = Minimal depression, 5-9 = Mild depression, 10-14 = Moderate depression, 15-19 = Moderately severe depression, 20-27 = Severe depression   Psychosocial Evaluation and Intervention:  Psychosocial Evaluation - 08/23/21 1410       Psychosocial Evaluation & Interventions   Interventions Encouraged to exercise with the program and follow exercise prescription    Comments Diahn has no barriers to attending the program. She has a great support system- husband,daughter, friends and the people she exercises with.  she has had a recent loss: her daughter's ex husband died recently.  SHe is also concerned that she is not getting better with her shortness of breath and coughing. She does have a referral to an Asthma and Allergy doctor at [facility].  Her goal is to gain better control of her symptoms and get back to her norm.    Expected Outcomes STG: attend all scheduled sessions , gain better control of breathing (shortness of breath)LTG: Angeni will be able to continue her progress after discharge    Continue Psychosocial Services  Follow up required by staff  Psychosocial Re-Evaluation:   Psychosocial Discharge (Final Psychosocial  Re-Evaluation):   Vocational Rehabilitation: Provide vocational rehab assistance to qualifying candidates.   Vocational Rehab Evaluation & Intervention:  Vocational Rehab - 08/23/21 1400       Initial Vocational Rehab Evaluation & Intervention   Assessment shows need for Vocational Rehabilitation No             Education: Education Goals: Education classes will be provided on a variety of topics geared toward better understanding of heart health and risk factor modification. Participant will state understanding/return demonstration of topics presented as noted by education test scores.  Learning Barriers/Preferences:  Learning Barriers/Preferences - 08/23/21 1356       Learning Barriers/Preferences   Learning Barriers None    Learning Preferences None             General Cardiac Education Topics:  AED/CPR: - Group verbal and written instruction with the use of models to demonstrate the basic use of the AED with the basic ABC's of resuscitation.   Anatomy and Cardiac Procedures: - Group verbal and visual presentation and models provide information about basic cardiac anatomy and function. Reviews the testing methods done to diagnose heart disease and the outcomes of the test results. Describes the treatment choices: Medical Management, Angioplasty, or Coronary Bypass Surgery for treating various heart conditions including Myocardial Infarction, Angina, Valve Disease, and Cardiac Arrhythmias.  Written material given at graduation. Flowsheet Row Cardiac Rehab from 09/06/2021 in [facility] Cardiac and Pulmonary Rehab  Education need identified 09/06/21       Medication Safety: - Group verbal and visual instruction to review commonly prescribed medications for heart and lung disease. Reviews the medication, class of the drug, and side effects. Includes the steps to properly store meds and maintain the prescription regimen.  Written material given at  graduation.   Intimacy: - Group verbal instruction through game format to discuss how heart and lung disease can affect sexual intimacy. Written material given at graduation..   Know Your Numbers and Heart Failure: - Group verbal and visual instruction to discuss disease risk factors for cardiac and pulmonary disease and treatment options.  Reviews associated critical values for Overweight/Obesity, Hypertension, Cholesterol, and Diabetes.  Discusses basics of heart failure: signs/symptoms and treatments.  Introduces Heart Failure Zone chart for action plan for heart failure.  Written material given at graduation. Flowsheet Row Cardiac Rehab from 09/06/2021 in [facility] Cardiac and Pulmonary Rehab  Education need identified 09/06/21       Infection Prevention: - Provides verbal and written material to individual with discussion of infection control including proper hand washing and proper equipment cleaning during exercise session. Flowsheet Row Cardiac Rehab from 09/06/2021 in [facility] Cardiac and Pulmonary Rehab  Date 09/06/21  Educator [facility]  Instruction Review Code 1- Verbalizes Understanding       Falls Prevention: - Provides verbal and written material to individual with discussion of falls prevention and safety. Flowsheet Row Cardiac Rehab from 09/06/2021 in [facility] Cardiac and Pulmonary Rehab  Date 08/23/21  Educator SB  Instruction Review Code 1- Verbalizes Understanding       Other: -Provides group and verbal instruction on various topics (see comments)   Knowledge Questionnaire Score:  Knowledge Questionnaire Score - 09/06/21 0937       Knowledge Questionnaire Score   Pre Score 20/26             Core Components/Risk Factors/Patient Goals at Admission:  Personal Goals and Risk Factors at Admission - 09/06/21 3532  Core Components/Risk Factors/Patient Goals on Admission    Weight Management Yes;Weight Loss    Intervention Weight Management: Develop a combined  nutrition and exercise program designed to reach desired caloric intake, while maintaining appropriate intake of nutrient and fiber, sodium and fats, and appropriate energy expenditure required for the weight goal.;Weight Management: Provide education and appropriate resources to help participant work on and attain dietary goals.    Admit Weight 143 lb 4.8 oz (65 kg)    Goal Weight: Short Term 138 lb (62.6 kg)    Goal Weight: Long Term 132 lb (59.9 kg)    Expected Outcomes Short Term: Continue to assess and modify interventions until short term weight is achieved;Long Term: Adherence to nutrition and physical activity/exercise program aimed toward attainment of established weight goal;Weight Loss: Understanding of general recommendations for a balanced deficit meal plan, which promotes 1-2 lb weight loss per week and includes a negative energy balance of [phone] kcal/d;Understanding recommendations for meals to include 15-35% energy as protein, 25-35% energy from fat, 35-60% energy from carbohydrates, less than 200mg  of dietary cholesterol, 20-35 gm of total fiber daily;Understanding of distribution of calorie intake throughout the day with the consumption of 4-5 meals/snacks    Increase knowledge of respiratory medications and ability to use respiratory devices properly  Yes    Intervention Provide education and demonstration as needed of appropriate use of medications, inhalers, and oxygen therapy.    Expected Outcomes Short Term: Achieves understanding of medications use. Understands that oxygen is a medication prescribed by physician. Demonstrates appropriate use of inhaler and oxygen therapy.;Long Term: Maintain appropriate use of medications, inhalers, and oxygen therapy.    Diabetes Yes    Intervention Provide education about signs/symptoms and action to take for hypo/hyperglycemia.;Provide education about proper nutrition, including hydration, and aerobic/resistive exercise prescription along with  prescribed medications to achieve blood glucose in normal ranges: Fasting glucose 65-99 mg/dL    Expected Outcomes Short Term: Participant verbalizes understanding of the signs/symptoms and immediate care of hyper/hypoglycemia, proper foot care and importance of medication, aerobic/resistive exercise and nutrition plan for blood glucose control.;Long Term: Attainment of HbA1C < 7%.    Heart Failure Yes    Intervention Provide a combined exercise and nutrition program that is supplemented with education, support and counseling about heart failure. Directed toward relieving symptoms such as shortness of breath, decreased exercise tolerance, and extremity edema.    Expected Outcomes Improve functional capacity of life;Short term: Attendance in program 2-3 days a week with increased exercise capacity. Reported lower sodium intake. Reported increased fruit and vegetable intake. Reports medication compliance.;Short term: Daily weights obtained and reported for increase. Utilizing diuretic protocols set by physician.    Lipids Yes    Intervention Provide education and support for participant on nutrition & aerobic/resistive exercise along with prescribed medications to achieve LDL 70mg , HDL >40mg .    Expected Outcomes Short Term: Participant states understanding of desired cholesterol values and is compliant with medications prescribed. Participant is following exercise prescription and nutrition guidelines.;Long Term: Cholesterol controlled with medications as prescribed, with individualized exercise RX and with personalized nutrition plan. Value goals: LDL < 70mg , HDL > 40 mg.             Education:Diabetes - Individual verbal and written instruction to review signs/symptoms of diabetes, desired ranges of glucose level fasting, after meals and with exercise. Acknowledge that pre and post exercise glucose checks will be done for 3 sessions at entry of program.   Core Components/Risk Factors/Patient  Goals Review:    Core Components/Risk Factors/Patient Goals at Discharge (Final Review):    ITP Comments:  ITP Comments     Row Name 08/23/21 1409 09/06/21 0931         ITP Comments Virtual orientation call completed today. shehas an appointment on Date: 09/06/2021  for EP eval and gym Orientation.  Documentation of diagnosis can be found in [facility]  Date: 03/24/21 . Completed 6MWT and gym orientation. Initial ITP created and sent for review to Dr. Emily Filbert, Medical Director.               Comments: Initial ITP

## 2021-09-06 NOTE — Patient Instructions (Signed)
Patient Instructions  Patient Details  Name: Elaine Wright MRN: [id number] Date of Birth: 09/11/1956 Referring Provider:  Nelva Bush, MD  Below are your personal goals for exercise, nutrition, and risk factors. Our goal is to help you stay on track towards obtaining and maintaining these goals. We will be discussing your progress on these goals with you throughout the program.  Initial Exercise Prescription:  Initial Exercise Prescription - 09/06/21 0900       Date of Initial Exercise RX and Referring Provider   Date 09/06/21    Referring Provider End, Harrell Gave MD      Oxygen   Maintain Oxygen Saturation 88% or higher      Treadmill   MPH 2.6    Grade 1    Minutes 15    METs 3.35      Elliptical   Level 1    Speed 2.7    Minutes 15    METs 3      REL-XR   Level 3    Speed 50    Minutes 15    METs 3      Prescription Details   Frequency (times per week) 2    Duration Progress to 30 minutes of continuous aerobic without signs/symptoms of physical distress      Intensity   THRR 40-80% of Max Heartrate 99-136    Ratings of Perceived Exertion 11-13    Perceived Dyspnea 0-4      Progression   Progression Continue to progress workloads to maintain intensity without signs/symptoms of physical distress.      Resistance Training   Training Prescription Yes    Weight 3lb    Reps 10-15             Exercise Goals: Frequency: Be able to perform aerobic exercise two to three times per week in program working toward 2-5 days per week of home exercise.  Intensity: Work with a perceived exertion of 11 (fairly light) - 15 (hard) while following your exercise prescription.  We will make changes to your prescription with you as you progress through the program.   Duration: Be able to do 30 to 45 minutes of continuous aerobic exercise in addition to a 5 minute warm-up and a 5 minute cool-down routine.   Nutrition Goals: Your personal nutrition goals will be  established when you do your nutrition analysis with the dietician.  The following are general nutrition guidelines to follow: Cholesterol < 200mg /day Sodium < 1500mg /day Fiber: Women over 50 yrs - 21 grams per day  Personal Goals:  Personal Goals and Risk Factors at Admission - 09/06/21 0937       Core Components/Risk Factors/Patient Goals on Admission    Weight Management Yes;Weight Loss    Intervention Weight Management: Develop a combined nutrition and exercise program designed to reach desired caloric intake, while maintaining appropriate intake of nutrient and fiber, sodium and fats, and appropriate energy expenditure required for the weight goal.;Weight Management: Provide education and appropriate resources to help participant work on and attain dietary goals.    Admit Weight 143 lb 4.8 oz (65 kg)    Goal Weight: Short Term 138 lb (62.6 kg)    Goal Weight: Long Term 132 lb (59.9 kg)    Expected Outcomes Short Term: Continue to assess and modify interventions until short term weight is achieved;Long Term: Adherence to nutrition and physical activity/exercise program aimed toward attainment of established weight goal;Weight Loss: Understanding of general recommendations for a balanced deficit  meal plan, which promotes 1-2 lb weight loss per week and includes a negative energy balance of [phone] kcal/d;Understanding recommendations for meals to include 15-35% energy as protein, 25-35% energy from fat, 35-60% energy from carbohydrates, less than 200mg  of dietary cholesterol, 20-35 gm of total fiber daily;Understanding of distribution of calorie intake throughout the day with the consumption of 4-5 meals/snacks    Increase knowledge of respiratory medications and ability to use respiratory devices properly  Yes    Intervention Provide education and demonstration as needed of appropriate use of medications, inhalers, and oxygen therapy.    Expected Outcomes Short Term: Achieves understanding  of medications use. Understands that oxygen is a medication prescribed by physician. Demonstrates appropriate use of inhaler and oxygen therapy.;Long Term: Maintain appropriate use of medications, inhalers, and oxygen therapy.    Diabetes Yes    Intervention Provide education about signs/symptoms and action to take for hypo/hyperglycemia.;Provide education about proper nutrition, including hydration, and aerobic/resistive exercise prescription along with prescribed medications to achieve blood glucose in normal ranges: Fasting glucose 65-99 mg/dL    Expected Outcomes Short Term: Participant verbalizes understanding of the signs/symptoms and immediate care of hyper/hypoglycemia, proper foot care and importance of medication, aerobic/resistive exercise and nutrition plan for blood glucose control.;Long Term: Attainment of HbA1C < 7%.    Heart Failure Yes    Intervention Provide a combined exercise and nutrition program that is supplemented with education, support and counseling about heart failure. Directed toward relieving symptoms such as shortness of breath, decreased exercise tolerance, and extremity edema.    Expected Outcomes Improve functional capacity of life;Short term: Attendance in program 2-3 days a week with increased exercise capacity. Reported lower sodium intake. Reported increased fruit and vegetable intake. Reports medication compliance.;Short term: Daily weights obtained and reported for increase. Utilizing diuretic protocols set by physician.    Lipids Yes    Intervention Provide education and support for participant on nutrition & aerobic/resistive exercise along with prescribed medications to achieve LDL 70mg , HDL >40mg .    Expected Outcomes Short Term: Participant states understanding of desired cholesterol values and is compliant with medications prescribed. Participant is following exercise prescription and nutrition guidelines.;Long Term: Cholesterol controlled with medications as  prescribed, with individualized exercise RX and with personalized nutrition plan. Value goals: LDL < 70mg , HDL > 40 mg.             Tobacco Use Initial Evaluation: Social History   Tobacco Use  Smoking Status Former   Packs/day: 0.50   Years: 45.00   Pack years: 22.50   Types: Cigarettes   Quit date: 03/11/2021   Years since quitting: 0.4  Smokeless Tobacco Never    Exercise Goals and Review:  Exercise Goals     Row Name 09/06/21 0935             Exercise Goals   Increase Physical Activity Yes       Intervention Provide advice, education, support and counseling about physical activity/exercise needs.;Develop an individualized exercise prescription for aerobic and resistive training based on initial evaluation findings, risk stratification, comorbidities and participant's personal goals.       Expected Outcomes Short Term: Attend rehab on a regular basis to increase amount of physical activity.;Long Term: Add in home exercise to make exercise part of routine and to increase amount of physical activity.;Long Term: Exercising regularly at least 3-5 days a week.       Increase Strength and Stamina Yes       Intervention Provide  advice, education, support and counseling about physical activity/exercise needs.;Develop an individualized exercise prescription for aerobic and resistive training based on initial evaluation findings, risk stratification, comorbidities and participant's personal goals.       Expected Outcomes Short Term: Increase workloads from initial exercise prescription for resistance, speed, and METs.;Short Term: Perform resistance training exercises routinely during rehab and add in resistance training at home;Long Term: Improve cardiorespiratory fitness, muscular endurance and strength as measured by increased METs and functional capacity (6MWT)       Able to understand and use rate of perceived exertion (RPE) scale Yes       Intervention Provide education and  explanation on how to use RPE scale       Expected Outcomes Short Term: Able to use RPE daily in rehab to express subjective intensity level;Long Term:  Able to use RPE to guide intensity level when exercising independently       Able to understand and use Dyspnea scale Yes       Intervention Provide education and explanation on how to use Dyspnea scale       Expected Outcomes Long Term: Able to use Dyspnea scale to guide intensity level when exercising independently;Short Term: Able to use Dyspnea scale daily in rehab to express subjective sense of shortness of breath during exertion       Knowledge and understanding of Target Heart Rate Range (THRR) Yes       Intervention Provide education and explanation of THRR including how the numbers were predicted and where they are located for reference       Expected Outcomes Short Term: Able to state/look up THRR;Short Term: Able to use daily as guideline for intensity in rehab;Long Term: Able to use THRR to govern intensity when exercising independently       Able to check pulse independently Yes       Intervention Provide education and demonstration on how to check pulse in carotid and radial arteries.;Review the importance of being able to check your own pulse for safety during independent exercise       Expected Outcomes Short Term: Able to explain why pulse checking is important during independent exercise;Long Term: Able to check pulse independently and accurately       Understanding of Exercise Prescription Yes       Intervention Provide education, explanation, and written materials on patient's individual exercise prescription       Expected Outcomes Short Term: Able to explain program exercise prescription;Long Term: Able to explain home exercise prescription to exercise independently                Copy of goals given to participant.

## 2021-09-11 ENCOUNTER — Other Ambulatory Visit: Payer: Self-pay

## 2021-09-11 DIAGNOSIS — I213 ST elevation (STEMI) myocardial infarction of unspecified site: Secondary | ICD-10-CM | POA: Diagnosis not present

## 2021-09-11 DIAGNOSIS — Z955 Presence of coronary angioplasty implant and graft: Secondary | ICD-10-CM

## 2021-09-11 LAB — GLUCOSE, CAPILLARY
Glucose-Capillary: 82 mg/dL (ref 70–99)
Glucose-Capillary: 100 mg/dL — ABNORMAL HIGH (ref 70–99)

## 2021-09-11 NOTE — Progress Notes (Signed)
Daily Session Note  Patient Details  Name: AIRIANNA KREISCHER MRN: [id number] Date of Birth: March 16, 1956 Referring Provider:   Flowsheet Row Cardiac Rehab from 09/06/2021 in [facility] Cardiac and Pulmonary Rehab  Referring Provider End, Harrell Gave MD       Encounter Date: 09/11/2021  Check In:  Session Check In - 09/11/21 0947       Check-In   Supervising physician immediately available to respond to emergencies See telemetry face sheet for immediately available ER MD    Location [facility]-Cardiac & Pulmonary Rehab    Staff Present Birdie Sons, MPA, RN;Jessica Luan Pulling, MA, RCEP, CCRP, CCET;Amanda Sommer, BA, ACSM CEP, Exercise Physiologist    Virtual Visit No    Medication changes reported     No    Fall or balance concerns reported    No    Warm-up and Cool-down Performed on first and last piece of equipment    Resistance Training Performed Yes    VAD Patient? No    PAD/SET Patient? No      Pain Assessment   Currently in Pain? No/denies                Social History   Tobacco Use  Smoking Status Former   Packs/day: 0.50   Years: 45.00   Pack years: 22.50   Types: Cigarettes   Quit date: 03/11/2021   Years since quitting: 0.5  Smokeless Tobacco Never    Goals Met:  Independence with exercise equipment Exercise tolerated well No report of concerns or symptoms today Strength training completed today  Goals Unmet:  Not Applicable  Comments: First full day of exercise!  Patient was oriented to gym and equipment including functions, settings, policies, and procedures.  Patient's individual exercise prescription and treatment plan were reviewed.  All starting workloads were established based on the results of the 6 minute walk test done at initial orientation visit.  The plan for exercise progression was also introduced and progression will be customized based on patient's performance and goals.     Dr. Emily Filbert is Medical Director for [facility].  Dr. Ottie Glazier is Medical Director for [facility] Pulmonary Rehabilitation.

## 2021-09-18 ENCOUNTER — Other Ambulatory Visit: Payer: Self-pay

## 2021-09-18 ENCOUNTER — Encounter: Payer: Medicare Other | Attending: Internal Medicine

## 2021-09-18 DIAGNOSIS — I213 ST elevation (STEMI) myocardial infarction of unspecified site: Secondary | ICD-10-CM

## 2021-09-18 DIAGNOSIS — E119 Type 2 diabetes mellitus without complications: Secondary | ICD-10-CM | POA: Diagnosis not present

## 2021-09-18 DIAGNOSIS — Z955 Presence of coronary angioplasty implant and graft: Secondary | ICD-10-CM | POA: Insufficient documentation

## 2021-09-18 DIAGNOSIS — Z48812 Encounter for surgical aftercare following surgery on the circulatory system: Secondary | ICD-10-CM | POA: Insufficient documentation

## 2021-09-18 DIAGNOSIS — I252 Old myocardial infarction: Secondary | ICD-10-CM | POA: Insufficient documentation

## 2021-09-18 LAB — GLUCOSE, CAPILLARY
Glucose-Capillary: 102 mg/dL — ABNORMAL HIGH (ref 70–99)
Glucose-Capillary: 117 mg/dL — ABNORMAL HIGH (ref 70–99)

## 2021-09-18 NOTE — Progress Notes (Signed)
Daily Session Note  Patient Details  Name: Elaine Wright MRN: [id number] Date of Birth: Jun 25, 1956 Referring Provider:   Flowsheet Row Cardiac Rehab from 09/06/2021 in [facility] Card[facility] Pulmonary Rehab  Referring Provider End, Harrell Gave MD       Encounter Date: 09/18/2021  Check In:  Session Check In - 09/18/21 1004       Check-In   Supervising physician immediately available to respond to emergencies See telemetry face sheet for immediately available ER MD    Location [facility]-Cardiac & Pulmonary Rehab    Staff Present Birdie Sons, MPA, RN;Jessica [employer], MA, RCEP, CCRP, CCET;Amanda Sommer, BA, ACSM CEP, Exercise Physiologist    Virtual Visit No    Medication changes reported     No    Fall or balance concerns reported    No    Warm-up and Cool-down Performed on first and last piece of equipment    Resistance Training Performed Yes    VAD Patient? No    PAD/SET Patient? No      Pain Assessment   Currently in Pain? No/denies                Social History   Tobacco Use  Smoking Status Former   Packs/day: 0.50   Years: 45.00   Pack years: 22.50   Types: Cigarettes   Quit date: 03/11/2021   Years since quitting: 0.5  Smokeless Tobacco Never    Goals Met:  Independence with exercise equipment Exercise tolerated well No report of concerns or symptoms today Strength training completed today  Goals Unmet:  Not Applicable  Comments: Pt able to follow exercise prescription today without complaint.  Will continue to monitor for progression.    Dr. Emily Filbert is Medical Director for [facility].  Dr. Ottie Glazier is Medical Director for [facility] Pulmonary Rehabilitation.

## 2021-09-19 ENCOUNTER — Encounter: Payer: Self-pay | Admitting: *Deleted

## 2021-09-19 DIAGNOSIS — I213 ST elevation (STEMI) myocardial infarction of unspecified site: Secondary | ICD-10-CM

## 2021-09-19 DIAGNOSIS — Z955 Presence of coronary angioplasty implant and graft: Secondary | ICD-10-CM

## 2021-09-19 NOTE — Progress Notes (Signed)
Cardiac Individual Treatment Plan  Patient Details  Name: Elaine Wright MRN: [id number] Date of Birth: 1956-12-05 Referring Provider:   Flowsheet Row Cardiac Rehab from 09/06/2021 in [facility] Cardiac and Pulmonary Rehab  Referring Provider End, Harrell Gave MD       Initial Encounter Date:  Flowsheet Row Cardiac Rehab from 09/06/2021 in [facility] Cardiac and Pulmonary Rehab  Date 09/06/21       Visit Diagnosis: ST elevation myocardial infarction (STEMI), unspecified artery [facility])  Status post coronary artery stent placement  Patient's Home Medications on Admission:  Current Outpatient Medications:    albuterol (VENTOLIN HFA) 108 (90 Base) MCG/ACT inhaler, Inhale 2 puffs into the lungs every 4 (four) hours as needed for wheezing or shortness of breath., Disp: , Rfl:    alendronate (FOSAMAX) 70 MG tablet, Take 70 mg by mouth every Friday., Disp: , Rfl:    aspirin 81 MG chewable tablet, Chew 1 tablet (81 mg total) by mouth daily., Disp: , Rfl:    atorvastatin (LIPITOR) 80 MG tablet, Take 1 tablet (80 mg total) by mouth daily., Disp: 90 tablet, Rfl: 1   benzonatate (TESSALON) 100 MG capsule, Take 100 mg by mouth 3 (three) times daily., Disp: , Rfl:    Carboxymethylcellulose Sodium (THERATEARS OP), Place 1 drop into both eyes daily as needed (when using contacts)., Disp: , Rfl:    dextromethorphan-guaiFENesin (MUCINEX DM) 30-600 MG 12hr tablet, Take 1 tablet by mouth 2 (two) times daily as needed for cough., Disp: , Rfl:    ipratropium-albuterol (DUONEB) 0.5-2.5 (3) MG/3ML SOLN, Inhale 3 mLs into the lungs every 6 (six) hours as needed., Disp: 360 mL, Rfl:    metFORMIN (GLUCOPHAGE-XR) 500 MG 24 hr tablet, Take 500 mg by mouth 2 (two) times daily., Disp: , Rfl:    metoprolol succinate (TOPROL-XL) 25 MG 24 hr tablet, Take 1 tablet (25 mg total) by mouth daily., Disp: 90 tablet, Rfl: 1   metroNIDAZOLE (METROCREAM) 0.75 % cream, Apply topically at bedtime. qhs to face for Rosacea, Disp: 45 g, Rfl:  6   Multiple Vitamin (MULTI-VITAMIN) tablet, Take 1 tablet by mouth daily., Disp: , Rfl:    nystatin (MYCOSTATIN) 100000 UNIT/ML suspension, Take 5 mLs by mouth 4 (four) times daily., Disp: , Rfl:    pantoprazole (PROTONIX) 40 MG tablet, Take 1 tablet (40 mg total) by mouth daily., Disp: 30 tablet, Rfl: 0   predniSONE (DELTASONE) 20 MG tablet, Take 1 tablet (20 mg total) by mouth daily with breakfast., Disp: 5 tablet, Rfl: 0   ticagrelor (BRILINTA) 90 MG TABS tablet, Take 1 tablet (90 mg total) by mouth 2 (two) times daily., Disp: 60 tablet, Rfl: 11   tiotropium (SPIRIVA) 18 MCG inhalation capsule, Place 1 capsule (18 mcg total) into inhaler and inhale daily., Disp: 30 capsule, Rfl: 12  Past Medical History: Past Medical History:  Diagnosis Date   Arthritis    Diabetes mellitus without complication (HCC)    GERD (gastroesophageal reflux disease)    Sleep apnea     Tobacco Use: Social History   Tobacco Use  Smoking Status Former   Packs/day: 0.50   Years: 45.00   Pack years: 22.50   Types: Cigarettes   Quit date: 03/11/2021   Years since quitting: 0.5  Smokeless Tobacco Never    Labs: Recent Review Flowsheet Data     Labs for ITP Cardiac and Pulmonary Rehab Latest Ref Rng & Units 03/24/2021 03/26/2021 05/13/2021   Cholestrol 0 - 200 mg/dL - 142 -  LDLCALC 0 - 99 mg/dL - 68 -   HDL >40 mg/dL - 62 -   Trlycerides <150 mg/dL - 61 -   Hemoglobin A1c 4.8 - 5.6 % 6.9(H) - 7.6(H)   PHART 7.350 - 7.450 7.25(L) - -   PCO2ART 32.0 - 48.0 mmHg 59(H) - -   HCO3 20.0 - 28.0 mmol/L 25.9 - -   ACIDBASEDEF 0.0 - 2.0 mmol/L 2.4(H) - -   O2SAT % 99.5 - -        Exercise Target Goals: Exercise Program Goal: Individual exercise prescription set using results from initial 6 min walk test and THRR while considering  patient's activity barriers and safety.   Exercise Prescription Goal: Initial exercise prescription builds to 30-45 minutes a day of aerobic activity, 2-3 days per week.  Home  exercise guidelines will be given to patient during program as part of exercise prescription that the participant will acknowledge.   Education: Aerobic Exercise: - Group verbal and visual presentation on the components of exercise prescription. Introduces F.I.T.T principle from ACSM for exercise prescriptions.  Reviews F.I.T.T. principles of aerobic exercise including progression. Written material given at graduation.   Education: Resistance Exercise: - Group verbal and visual presentation on the components of exercise prescription. Introduces F.I.T.T principle from ACSM for exercise prescriptions  Reviews F.I.T.T. principles of resistance exercise including progression. Written material given at graduation.    Education: Exercise & Equipment Safety: - Individual verbal instruction and demonstration of equipment use and safety with use of the equipment. Flowsheet Row Cardiac Rehab from 09/06/2021 in [facility] Cardiac and Pulmonary Rehab  Date 09/06/21  Educator [facility]  Instruction Review Code 1- Verbalizes Understanding       Education: Exercise Physiology & General Exercise Guidelines: - Group verbal and written instruction with models to review the exercise physiology of the cardiovascular system and associated critical values. Provides general exercise guidelines with specific guidelines to those with heart or lung disease.  Flowsheet Row Cardiac Rehab from 09/06/2021 in [facility] Cardiac and Pulmonary Rehab  Education need identified 09/06/21       Education: Flexibility, Balance, Mind/Body Relaxation: - Group verbal and visual presentation with interactive activity on the components of exercise prescription. Introduces F.I.T.T principle from ACSM for exercise prescriptions. Reviews F.I.T.T. principles of flexibility and balance exercise training including progression. Also discusses the mind body connection.  Reviews various relaxation techniques to help reduce and manage stress (i.e. Deep  breathing, progressive muscle relaxation, and visualization). Balance handout provided to take home. Written material given at graduation.   Activity Barriers & Risk Stratification:  Activity Barriers & Cardiac Risk Stratification - 09/06/21 0933       Activity Barriers & Cardiac Risk Stratification   Activity Barriers Arthritis;Left Knee Replacement;Right Knee Replacement;Deconditioning    Comments arthritis neck down on left side, R knee is a partial    Cardiac Risk Stratification High             6 Minute Walk:  6 Minute Walk     Row Name 09/06/21 0931         6 Minute Walk   Phase Initial     Distance 1353 feet     Walk Time 6 minutes     # of Rest Breaks 0     MPH 2.56     METS 3.09     RPE 9     VO2 Peak 10.82     Symptoms No     Resting HR 62 bpm  Resting BP 122/62     Resting Oxygen Saturation  96 %     Exercise Oxygen Saturation  during 6 min walk 98 %     Max Ex. HR 85 bpm     Max Ex. BP 132/70     2 Minute Post BP 126/70              Oxygen Initial Assessment:   Oxygen Re-Evaluation:   Oxygen Discharge (Final Oxygen Re-Evaluation):   Initial Exercise Prescription:  Initial Exercise Prescription - 09/06/21 0900       Date of Initial Exercise RX and Referring Provider   Date 09/06/21    Referring Provider End, Harrell Gave MD      Oxygen   Maintain Oxygen Saturation 88% or higher      Treadmill   MPH 2.6    Grade 1    Minutes 15    METs 3.35      Elliptical   Level 1    Speed 2.7    Minutes 15    METs 3      REL-XR   Level 3    Speed 50    Minutes 15    METs 3      Prescription Details   Frequency (times per week) 2    Duration Progress to 30 minutes of continuous aerobic without signs/symptoms of physical distress      Intensity   THRR 40-80% of Max Heartrate 99-136    Ratings of Perceived Exertion 11-13    Perceived Dyspnea 0-4      Progression   Progression Continue to progress workloads to maintain  intensity without signs/symptoms of physical distress.      Resistance Training   Training Prescription Yes    Weight 3lb    Reps 10-15             Perform Capillary Blood Glucose checks as needed.  Exercise Prescription Changes:   Exercise Prescription Changes     Row Name 09/06/21 0900 09/12/21 1200           Response to Exercise   Blood Pressure (Admit) 122/62 132/70      Blood Pressure (Exercise) 132/70 144/70      Blood Pressure (Exit) 126/70 104/56      Heart Rate (Admit) 62 bpm 61 bpm      Heart Rate (Exercise) 85 bpm 126 bpm      Heart Rate (Exit) 66 bpm 63 bpm      Oxygen Saturation (Admit) 96 % --      Oxygen Saturation (Exercise) 98 % --      Rating of Perceived Exertion (Exercise) 9 11      Symptoms none none      Comments walk test results first day             Progression   Progression -- Continue to progress workloads to maintain intensity without signs/symptoms of physical distress.      Average METs -- 2.9             Resistance Training   Training Prescription -- Yes      Weight -- 3 lb      Reps -- 10-15             Treadmill   MPH -- 2.6      Grade -- 1      Minutes -- 15      METs -- 3.35  NuStep   Level -- 3      SPM -- 80      Minutes -- 15      METs -- 2.4               Exercise Comments:   Exercise Comments     Row Name 09/11/21 0947           Exercise Comments First full day of exercise!  Patient was oriented to gym and equipment including functions, settings, policies, and procedures.  Patient's individual exercise prescription and treatment plan were reviewed.  All starting workloads were established based on the results of the 6 minute walk test done at initial orientation visit.  The plan for exercise progression was also introduced and progression will be customized based on patient's performance and goals.                Exercise Goals and Review:   Exercise Goals     Row Name 09/06/21  0935             Exercise Goals   Increase Physical Activity Yes       Intervention Provide advice, education, support and counseling about physical activity/exercise needs.;Develop an individualized exercise prescription for aerobic and resistive training based on initial evaluation findings, risk stratification, comorbidities and participant's personal goals.       Expected Outcomes Short Term: Attend rehab on a regular basis to increase amount of physical activity.;Long Term: Add in home exercise to make exercise part of routine and to increase amount of physical activity.;Long Term: Exercising regularly at least 3-5 days a week.       Increase Strength and Stamina Yes       Intervention Provide advice, education, support and counseling about physical activity/exercise needs.;Develop an individualized exercise prescription for aerobic and resistive training based on initial evaluation findings, risk stratification, comorbidities and participant's personal goals.       Expected Outcomes Short Term: Increase workloads from initial exercise prescription for resistance, speed, and METs.;Short Term: Perform resistance training exercises routinely during rehab and add in resistance training at home;Long Term: Improve cardiorespiratory fitness, muscular endurance and strength as measured by increased METs and functional capacity (6MWT)       Able to understand and use rate of perceived exertion (RPE) scale Yes       Intervention Provide education and explanation on how to use RPE scale       Expected Outcomes Short Term: Able to use RPE daily in rehab to express subjective intensity level;Long Term:  Able to use RPE to guide intensity level when exercising independently       Able to understand and use Dyspnea scale Yes       Intervention Provide education and explanation on how to use Dyspnea scale       Expected Outcomes Long Term: Able to use Dyspnea scale to guide intensity level when exercising  independently;Short Term: Able to use Dyspnea scale daily in rehab to express subjective sense of shortness of breath during exertion       Knowledge and understanding of Target Heart Rate Range (THRR) Yes       Intervention Provide education and explanation of THRR including how the numbers were predicted and where they are located for reference       Expected Outcomes Short Term: Able to state/look up THRR;Short Term: Able to use daily as guideline for intensity in rehab;Long Term: Able to use THRR to govern intensity  when exercising independently       Able to check pulse independently Yes       Intervention Provide education and demonstration on how to check pulse in carotid and radial arteries.;Review the importance of being able to check your own pulse for safety during independent exercise       Expected Outcomes Short Term: Able to explain why pulse checking is important during independent exercise;Long Term: Able to check pulse independently and accurately       Understanding of Exercise Prescription Yes       Intervention Provide education, explanation, and written materials on patient's individual exercise prescription       Expected Outcomes Short Term: Able to explain program exercise prescription;Long Term: Able to explain home exercise prescription to exercise independently                Exercise Goals Re-Evaluation :  Exercise Goals Re-Evaluation     [place] Name 09/11/21 0947             Exercise Goal Re-Evaluation   Exercise Goals Review Increase Physical Activity;Able to understand and use rate of perceived exertion (RPE) scale;Knowledge and understanding of Target Heart Rate Range (THRR);Understanding of Exercise Prescription;Increase Strength and Stamina;Able to understand and use Dyspnea scale;Able to check pulse independently       Comments Reviewed RPE and dyspnea scales, THR and program prescription with pt today.  Pt voiced understanding and was given a copy of  goals to take home.       Expected Outcomes Short: Use RPE daily to regulate intensity. Long: Follow program prescription in THR.                Discharge Exercise Prescription (Final Exercise Prescription Changes):  Exercise Prescription Changes - 09/12/21 1200       Response to Exercise   Blood Pressure (Admit) 132/70    Blood Pressure (Exercise) 144/70    Blood Pressure (Exit) 104/56    Heart Rate (Admit) 61 bpm    Heart Rate (Exercise) 126 bpm    Heart Rate (Exit) 63 bpm    Rating of Perceived Exertion (Exercise) 11    Symptoms none    Comments first day      Progression   Progression Continue to progress workloads to maintain intensity without signs/symptoms of physical distress.    Average METs 2.9      Resistance Training   Training Prescription Yes    Weight 3 lb    Reps 10-15      Treadmill   MPH 2.6    Grade 1    Minutes 15    METs 3.35      NuStep   Level 3    SPM 80    Minutes 15    METs 2.4             Nutrition:  Target Goals: Understanding of nutrition guidelines, daily intake of sodium 1500mg , cholesterol 200mg , calories 30% from fat and 7% or less from saturated fats, daily to have 5 or more servings of fruits and vegetables.  Education: All About Nutrition: -Group instruction provided by verbal, written material, interactive activities, discussions, models, and posters to present general guidelines for heart healthy nutrition including fat, fiber, MyPlate, the role of sodium in heart healthy nutrition, utilization of the nutrition label, and utilization of this knowledge for meal planning. Follow up email sent as well. Written material given at graduation. Flowsheet Row Cardiac Rehab from 09/06/2021 in [facility] Cardiac  and Pulmonary Rehab  Education need identified 09/06/21       Biometrics:  Pre Biometrics - 09/06/21 0935       Pre Biometrics   Height 5\' 2"  (1.575 m)    Weight 143 lb 4.8 oz (65 kg)    BMI (Calculated) 26.2     Single Leg Stand 30 seconds              Nutrition Therapy Plan and Nutrition Goals:  Nutrition Therapy & Goals - 09/06/21 0936       Intervention Plan   Intervention Prescribe, educate and counsel regarding individualized specific dietary modifications aiming towards targeted core components such as weight, hypertension, lipid management, diabetes, heart failure and other comorbidities.    Expected Outcomes Short Term Goal: Understand basic principles of dietary content, such as calories, fat, sodium, cholesterol and nutrients.;Short Term Goal: A plan has been developed with personal nutrition goals set during dietitian appointment.;Long Term Goal: Adherence to prescribed nutrition plan.             Nutrition Assessments:  MEDIFICTS Score Key: ?70 Need to make dietary changes  40-70 Heart Healthy Diet ? 40 Therapeutic Level Cholesterol Diet  Flowsheet Row Cardiac Rehab from 09/06/2021 in [facility] Cardiac and Pulmonary Rehab  Picture Your Plate Total Score on Admission 73      Picture Your Plate Scores: <29 Unhealthy dietary pattern with much room for improvement. 41-50 Dietary pattern unlikely to meet recommendations for good health and room for improvement. 51-60 More healthful dietary pattern, with some room for improvement.  >60 Healthy dietary pattern, although there may be some specific behaviors that could be improved.    Nutrition Goals Re-Evaluation:   Nutrition Goals Discharge (Final Nutrition Goals Re-Evaluation):   Psychosocial: Target Goals: Acknowledge presence or absence of significant depression and/or stress, maximize coping skills, provide positive support system. Participant is able to verbalize types and ability to use techniques and skills needed for reducing stress and depression.   Education: Stress, Anxiety, and Depression - Group verbal and visual presentation to define topics covered.  Reviews how body is impacted by stress, anxiety, and  depression.  Also discusses healthy ways to reduce stress and to treat/manage anxiety and depression.  Written material given at graduation.   Education: Sleep Hygiene -Provides group verbal and written instruction about how sleep can affect your health.  Define sleep hygiene, discuss sleep cycles and impact of sleep habits. Review good sleep hygiene tips.    Initial Review & Psychosocial Screening:  Initial Psych Review & Screening - 08/23/21 1349       Initial Review   Current issues with None Identified      Family Dynamics   Good Support System? Yes   husband, daughter, grandkids, friends, people at the gym   Concerns Recent loss of significant other    Comments son in law- ex.  recent death      Barriers   Psychosocial barriers to participate in program There are no identifiable barriers or psychosocial needs.      Screening Interventions   Interventions Encouraged to exercise;To provide support and resources with identified psychosocial needs;Provide feedback about the scores to participant    Expected Outcomes Short Term goal: Utilizing psychosocial counselor, staff and physician to assist with identification of specific Stressors or current issues interfering with healing process. Setting desired goal for each stressor or current issue identified.;Long Term Goal: Stressors or current issues are controlled or eliminated.;Short Term goal: Identification and review with  participant of any Quality of Life or Depression concerns found by scoring the questionnaire.;Long Term goal: The participant improves quality of Life and PHQ9 Scores as seen by post scores and/or verbalization of changes             Quality of Life Scores:   Quality of Life - 09/06/21 0936       Quality of Life   Select Quality of Life   updated in person     Quality of Life Scores   Health/Function Pre 28 %    Socioeconomic Pre 29.64 %    Psych/Spiritual Pre 29.14 %    Family Pre 30 %    GLOBAL Pre  28.87 %            Scores of 19 and below usually indicate a poorer quality of life in these areas.  A difference of  2-3 points is a clinically meaningful difference.  A difference of 2-3 points in the total score of the Quality of Life Index has been associated with significant improvement in overall quality of life, self-image, physical symptoms, and general health in studies assessing change in quality of life.  PHQ-9: Recent Review Flowsheet Data     Depression screen [facility] 2/9 09/06/2021   Decreased Interest 0   Down, Depressed, Hopeless 1   PHQ - 2 Score 1   Altered sleeping 2   Tired, decreased energy 2   Change in appetite 0   Feeling bad or failure about yourself  0   Trouble concentrating 0   Moving slowly or fidgety/restless 0   Suicidal thoughts 0   PHQ-9 Score 5   Difficult doing work/chores Somewhat difficult      Interpretation of Total Score  Total Score Depression Severity:  1-4 = Minimal depression, 5-9 = Mild depression, 10-14 = Moderate depression, 15-19 = Moderately severe depression, 20-27 = Severe depression   Psychosocial Evaluation and Intervention:  Psychosocial Evaluation - 08/23/21 1410       Psychosocial Evaluation & Interventions   Interventions Encouraged to exercise with the program and follow exercise prescription    Comments Vannia has no barriers to attending the program. She has a great support system- husband,daughter, friends and the people she exercises with.  she has had a recent loss: her daughter's ex husband died recently.  SHe is also concerned that she is not getting better with her shortness of breath and coughing. She does have a referral to an Asthma and Allergy doctor at [facility].  Her goal is to gain better control of her symptoms and get back to her norm.    Expected Outcomes STG: attend all scheduled sessions , gain better control of breathing (shortness of breath)LTG: Nichol will be able to continue her progress after discharge     Continue Psychosocial Services  Follow up required by staff             Psychosocial Re-Evaluation:   Psychosocial Discharge (Final Psychosocial Re-Evaluation):   Vocational Rehabilitation: Provide vocational rehab assistance to qualifying candidates.   Vocational Rehab Evaluation & Intervention:  Vocational Rehab - 08/23/21 1400       Initial Vocational Rehab Evaluation & Intervention   Assessment shows need for Vocational Rehabilitation No             Education: Education Goals: Education classes will be provided on a variety of topics geared toward better understanding of heart health and risk factor modification. Participant will state understanding/return demonstration of topics presented as  noted by education test scores.  Learning Barriers/Preferences:  Learning Barriers/Preferences - 08/23/21 1356       Learning Barriers/Preferences   Learning Barriers None    Learning Preferences None             General Cardiac Education Topics:  AED/CPR: - Group verbal and written instruction with the use of models to demonstrate the basic use of the AED with the basic ABC's of resuscitation.   Anatomy and Cardiac Procedures: - Group verbal and visual presentation and models provide information about basic cardiac anatomy and function. Reviews the testing methods done to diagnose heart disease and the outcomes of the test results. Describes the treatment choices: Medical Management, Angioplasty, or Coronary Bypass Surgery for treating various heart conditions including Myocardial Infarction, Angina, Valve Disease, and Cardiac Arrhythmias.  Written material given at graduation. Flowsheet Row Cardiac Rehab from 09/06/2021 in [facility] Cardiac and Pulmonary Rehab  Education need identified 09/06/21       Medication Safety: - Group verbal and visual instruction to review commonly prescribed medications for heart and lung disease. Reviews the medication, class of the  drug, and side effects. Includes the steps to properly store meds and maintain the prescription regimen.  Written material given at graduation.   Intimacy: - Group verbal instruction through game format to discuss how heart and lung disease can affect sexual intimacy. Written material given at graduation..   Know Your Numbers and Heart Failure: - Group verbal and visual instruction to discuss disease risk factors for cardiac and pulmonary disease and treatment options.  Reviews associated critical values for Overweight/Obesity, Hypertension, Cholesterol, and Diabetes.  Discusses basics of heart failure: signs/symptoms and treatments.  Introduces Heart Failure Zone chart for action plan for heart failure.  Written material given at graduation. Flowsheet Row Cardiac Rehab from 09/06/2021 in [facility] Cardiac and Pulmonary Rehab  Education need identified 09/06/21       Infection Prevention: - Provides verbal and written material to individual with discussion of infection control including proper hand washing and proper equipment cleaning during exercise session. Flowsheet Row Cardiac Rehab from 09/06/2021 in [facility] Cardiac and Pulmonary Rehab  Date 09/06/21  Educator [facility]  Instruction Review Code 1- Verbalizes Understanding       Falls Prevention: - Provides verbal and written material to individual with discussion of falls prevention and safety. Flowsheet Row Cardiac Rehab from 09/06/2021 in [facility] Cardiac and Pulmonary Rehab  Date 08/23/21  Educator SB  Instruction Review Code 1- Verbalizes Understanding       Other: -Provides group and verbal instruction on various topics (see comments)   Knowledge Questionnaire Score:  Knowledge Questionnaire Score - 09/06/21 7209       Knowledge Questionnaire Score   Pre Score 20/26             Core Components/Risk Factors/Patient Goals at Admission:  Personal Goals and Risk Factors at Admission - 09/06/21 0937       Core Components/Risk  Factors/Patient Goals on Admission    Weight Management Yes;Weight Loss    Intervention Weight Management: Develop a combined nutrition and exercise program designed to reach desired caloric intake, while maintaining appropriate intake of nutrient and fiber, sodium and fats, and appropriate energy expenditure required for the weight goal.;Weight Management: Provide education and appropriate resources to help participant work on and attain dietary goals.    Admit Weight 143 lb 4.8 oz (65 kg)    Goal Weight: Short Term 138 lb (62.6 kg)    Goal  Weight: Long Term 132 lb (59.9 kg)    Expected Outcomes Short Term: Continue to assess and modify interventions until short term weight is achieved;Long Term: Adherence to nutrition and physical activity/exercise program aimed toward attainment of established weight goal;Weight Loss: Understanding of general recommendations for a balanced deficit meal plan, which promotes 1-2 lb weight loss per week and includes a negative energy balance of [phone] kcal/d;Understanding recommendations for meals to include 15-35% energy as protein, 25-35% energy from fat, 35-60% energy from carbohydrates, less than 200mg  of dietary cholesterol, 20-35 gm of total fiber daily;Understanding of distribution of calorie intake throughout the day with the consumption of 4-5 meals/snacks    Increase knowledge of respiratory medications and ability to use respiratory devices properly  Yes    Intervention Provide education and demonstration as needed of appropriate use of medications, inhalers, and oxygen therapy.    Expected Outcomes Short Term: Achieves understanding of medications use. Understands that oxygen is a medication prescribed by physician. Demonstrates appropriate use of inhaler and oxygen therapy.;Long Term: Maintain appropriate use of medications, inhalers, and oxygen therapy.    Diabetes Yes    Intervention Provide education about signs/symptoms and action to take for  hypo/hyperglycemia.;Provide education about proper nutrition, including hydration, and aerobic/resistive exercise prescription along with prescribed medications to achieve blood glucose in normal ranges: Fasting glucose 65-99 mg/dL    Expected Outcomes Short Term: Participant verbalizes understanding of the signs/symptoms and immediate care of hyper/hypoglycemia, proper foot care and importance of medication, aerobic/resistive exercise and nutrition plan for blood glucose control.;Long Term: Attainment of HbA1C < 7%.    Heart Failure Yes    Intervention Provide a combined exercise and nutrition program that is supplemented with education, support and counseling about heart failure. Directed toward relieving symptoms such as shortness of breath, decreased exercise tolerance, and extremity edema.    Expected Outcomes Improve functional capacity of life;Short term: Attendance in program 2-3 days a week with increased exercise capacity. Reported lower sodium intake. Reported increased fruit and vegetable intake. Reports medication compliance.;Short term: Daily weights obtained and reported for increase. Utilizing diuretic protocols set by physician.    Lipids Yes    Intervention Provide education and support for participant on nutrition & aerobic/resistive exercise along with prescribed medications to achieve LDL 70mg , HDL >40mg .    Expected Outcomes Short Term: Participant states understanding of desired cholesterol values and is compliant with medications prescribed. Participant is following exercise prescription and nutrition guidelines.;Long Term: Cholesterol controlled with medications as prescribed, with individualized exercise RX and with personalized nutrition plan. Value goals: LDL < 70mg , HDL > 40 mg.             Education:Diabetes - Individual verbal and written instruction to review signs/symptoms of diabetes, desired ranges of glucose level fasting, after meals and with exercise.  Acknowledge that pre and post exercise glucose checks will be done for 3 sessions at entry of program.   Core Components/Risk Factors/Patient Goals Review:    Core Components/Risk Factors/Patient Goals at Discharge (Final Review):    ITP Comments:  ITP Comments     Row Name 08/23/21 1409 09/06/21 0931 09/11/21 0947 09/19/21 0929     ITP Comments Virtual orientation call completed today. shehas an appointment on Date: 09/06/2021  for EP eval and gym Orientation.  Documentation of diagnosis [facility] be found in [facility]  Date: 03/24/21 . Completed 6MWT and gym orientation. Initial ITP created and sent for review to Dr. Emily Filbert, Medical Director. First full day of  exercise!  Patient was oriented to gym and equipment including functions, settings, policies, and procedures.  Patient's individual exercise prescription and treatment plan were reviewed.  All starting workloads were established based on the results of the 6 minute walk test done at initial orientation visit.  The plan for exercise progression was also introduced and progression will be customized based on patient's performance and goals. 30 day review completed. ITP sent to Dr. Emily Filbert, Medical Director of Cardiac Rehab. Continue with ITP unless changes are made by physician.             Comments: 30 day review

## 2021-09-20 ENCOUNTER — Other Ambulatory Visit: Payer: Self-pay

## 2021-09-20 DIAGNOSIS — Z955 Presence of coronary angioplasty implant and graft: Secondary | ICD-10-CM

## 2021-09-20 DIAGNOSIS — I213 ST elevation (STEMI) myocardial infarction of unspecified site: Secondary | ICD-10-CM

## 2021-09-20 DIAGNOSIS — Z48812 Encounter for surgical aftercare following surgery on the circulatory system: Secondary | ICD-10-CM | POA: Diagnosis not present

## 2021-09-20 NOTE — Progress Notes (Signed)
Daily Session Note  Patient Details  Name: Elaine Wright MRN: [id number] Date of Birth: 08-20-1956 Referring Provider:   Flowsheet Row Cardiac Rehab from 09/06/2021 in [facility] Cardiac and Pulmonary Rehab  Referring Provider End, Harrell Gave MD       Encounter Date: 09/20/2021  Check In:  Session Check In - 09/20/21 0951       Check-In   Supervising physician immediately available to respond to emergencies See telemetry face sheet for immediately available ER MD    Location [facility]-Cardiac & Pulmonary Rehab    Staff Present Birdie Sons, MPA, RN;Jessica Luan Pulling, MA, RCEP, CCRP, CCET;Amanda Sommer, BA, ACSM CEP, Exercise Physiologist    Virtual Visit No    Medication changes reported     No    Fall or balance concerns reported    No    Warm-up and Cool-down Performed on first and last piece of equipment    Resistance Training Performed Yes    VAD Patient? No    PAD/SET Patient? No      Pain Assessment   Currently in Pain? No/denies                Social History   Tobacco Use  Smoking Status Former   Packs/day: 0.50   Years: 45.00   Pack years: 22.50   Types: Cigarettes   Quit date: 03/11/2021   Years since quitting: 0.5  Smokeless Tobacco Never    Goals Met:  Independence with exercise equipment Exercise tolerated well No report of concerns or symptoms today Strength training completed today  Goals Unmet:  Not Applicable  Comments: Pt able to follow exercise prescription today without complaint.  Will continue to monitor for progression.    Dr. Emily Filbert is Medical Director for [place].  Dr. Ottie Glazier is Medical Director for [facility] Pulmonary Rehabilitation.

## 2021-09-25 ENCOUNTER — Other Ambulatory Visit: Payer: Self-pay

## 2021-09-25 DIAGNOSIS — I213 ST elevation (STEMI) myocardial infarction of unspecified site: Secondary | ICD-10-CM

## 2021-09-25 DIAGNOSIS — Z955 Presence of coronary angioplasty implant and graft: Secondary | ICD-10-CM

## 2021-09-25 DIAGNOSIS — Z48812 Encounter for surgical aftercare following surgery on the circulatory system: Secondary | ICD-10-CM | POA: Diagnosis not present

## 2021-09-25 NOTE — Progress Notes (Signed)
Daily Session Note  Patient Details  Name: Elaine Wright MRN: [id number] Date of Birth: 17-Sep-1956 Referring Provider:   Flowsheet Row Cardiac Rehab from 09/06/2021 in [facility] Cardiac and Pulmonary Rehab  Referring Provider End, Harrell Gave MD       Encounter Date: 09/25/2021  Check In:  Session Check In - 09/25/21 1042       Check-In   Supervising physician immediately available to respond to emergencies See telemetry face sheet for immediately available ER MD    Location [facility]-Cardiac & Pulmonary Rehab    Staff Present Birdie Sons, MPA, RN;Jessica Tumacacori-Carmen, MA, RCEP, CCRP, CCET;Amanda Sommer, BA, ACSM CEP, Exercise Physiologist    Virtual Visit No    Medication changes reported     No    Fall or balance concerns reported    No    Warm-up and Cool-down Performed on first and last piece of equipment    Resistance Training Performed Yes    VAD Patient? No    PAD/SET Patient? No      Pain Assessment   Currently in Pain? No/denies                Social History   Tobacco Use  Smoking Status Former   Packs/day: 0.50   Years: 45.00   Pack years: 22.50   Types: Cigarettes   Quit date: 03/11/2021   Years since quitting: 0.5  Smokeless Tobacco Never    Goals Met:  Independence with exercise equipment Exercise tolerated well No report of concerns or symptoms today Strength training completed today  Goals Unmet:  Not Applicable  Comments: Pt able to follow exercise prescription today without complaint.  Will continue to monitor for progression.    Dr. Emily Filbert is Medical Director for [place].  Dr. Ottie Glazier is Medical Director for [facility] Pulmonary Rehabilitation.

## 2021-09-26 ENCOUNTER — Other Ambulatory Visit: Payer: Self-pay

## 2021-09-26 DIAGNOSIS — I213 ST elevation (STEMI) myocardial infarction of unspecified site: Secondary | ICD-10-CM

## 2021-09-26 NOTE — Progress Notes (Signed)
Completed initial nutrition consultation.   

## 2021-10-02 ENCOUNTER — Other Ambulatory Visit: Payer: Self-pay

## 2021-10-02 DIAGNOSIS — I213 ST elevation (STEMI) myocardial infarction of unspecified site: Secondary | ICD-10-CM

## 2021-10-02 DIAGNOSIS — Z955 Presence of coronary angioplasty implant and graft: Secondary | ICD-10-CM

## 2021-10-02 DIAGNOSIS — Z48812 Encounter for surgical aftercare following surgery on the circulatory system: Secondary | ICD-10-CM | POA: Diagnosis not present

## 2021-10-02 NOTE — Progress Notes (Signed)
Daily Session Note  Patient Details  Name: Elaine Wright MRN: [id number] Date of Birth: [birth year] Referring Provider:   Flowsheet Row Cardiac Rehab from 09/06/2021 in [facility] Cardiac and Pulmonary Rehab  Referring Provider End, Harrell Gave MD       Encounter Date: 10/02/2021  Check In:  Session Check In - 10/02/21 0950       Check-In   Supervising physician immediately available to respond to emergencies See telemetry face sheet for immediately available ER MD    Location [facility]-Cardiac & Pulmonary Rehab    Staff Present Birdie Sons, MPA, RN;Jessica Luan Pulling, MA, RCEP, CCRP, CCET;Amanda Sommer, BA, ACSM CEP, Exercise Physiologist    Virtual Visit No    Medication changes reported     No    Fall or balance concerns reported    No    Warm-up and Cool-down Performed on first and last piece of equipment    Resistance Training Performed Yes    VAD Patient? No    PAD/SET Patient? No      Pain Assessment   Currently in Pain? No/denies                Social History   Tobacco Use  Smoking Status Former   Packs/day: 0.50   Years: 45.00   Pack years: 22.50   Types: Cigarettes   Quit date: 03/11/2021   Years since quitting: 0.5  Smokeless Tobacco Never    Goals Met:  Independence with exercise equipment Exercise tolerated well No report of concerns or symptoms today Strength training completed today  Goals Unmet:  Not Applicable  Comments: Pt able to follow exercise prescription today without complaint.  Will continue to monitor for progression.    Dr. Emily Filbert is Medical Director for [place].  Dr. Ottie Glazier is Medical Director for [facility] Pulmonary Rehabilitation.

## 2021-10-04 ENCOUNTER — Other Ambulatory Visit: Payer: Self-pay

## 2021-10-04 DIAGNOSIS — Z955 Presence of coronary angioplasty implant and graft: Secondary | ICD-10-CM

## 2021-10-04 DIAGNOSIS — Z48812 Encounter for surgical aftercare following surgery on the circulatory system: Secondary | ICD-10-CM | POA: Diagnosis not present

## 2021-10-04 DIAGNOSIS — I213 ST elevation (STEMI) myocardial infarction of unspecified site: Secondary | ICD-10-CM

## 2021-10-04 NOTE — Progress Notes (Signed)
Daily Session Note  Patient Details  Name: Elaine Wright MRN: [id number] Date of Birth: Apr 20, 1956 Referring Provider:   Flowsheet Row Cardiac Rehab from 09/06/2021 in [facility] Cardiac and Pulmonary Rehab  Referring Provider End, Harrell Gave MD       Encounter Date: 10/04/2021  Check In:  Session Check In - 10/04/21 0953       Check-In   Supervising physician immediately available to respond to emergencies See telemetry face sheet for immediately available ER MD    Location [facility]-Cardiac & Pulmonary Rehab    Staff Present Birdie Sons, MPA, Mauricia Area, BS, ACSM CEP, Exercise Physiologist;Amanda Oletta Darter, BA, ACSM CEP, Exercise Physiologist;Jessica Luan Pulling, MA, RCEP, CCRP, CCET    Virtual Visit No    Medication changes reported     No    Fall or balance concerns reported    No    Warm-up and Cool-down Performed on first and last piece of equipment    Resistance Training Performed Yes    VAD Patient? No    PAD/SET Patient? No      Pain Assessment   Currently in Pain? No/denies                Social History   Tobacco Use  Smoking Status Former   Packs/day: 0.50   Years: 45.00   Pack years: 22.50   Types: Cigarettes   Quit date: 03/11/2021   Years since quitting: 0.5  Smokeless Tobacco Never    Goals Met:  Independence with exercise equipment Exercise tolerated well No report of concerns or symptoms today Strength training completed today  Goals Unmet:  Not Applicable  Comments: Pt able to follow exercise prescription today without complaint.  Will continue to monitor for progression.    Dr. Emily Filbert is Medical Director for [place].  Dr. Ottie Glazier is Medical Director for [facility] Pulmonary Rehabilitation.

## 2021-10-09 ENCOUNTER — Other Ambulatory Visit: Payer: Self-pay

## 2021-10-09 DIAGNOSIS — I213 ST elevation (STEMI) myocardial infarction of unspecified site: Secondary | ICD-10-CM

## 2021-10-09 DIAGNOSIS — Z48812 Encounter for surgical aftercare following surgery on the circulatory system: Secondary | ICD-10-CM | POA: Diagnosis not present

## 2021-10-09 DIAGNOSIS — Z955 Presence of coronary angioplasty implant and graft: Secondary | ICD-10-CM

## 2021-10-09 NOTE — Progress Notes (Signed)
Daily Session Note  Patient Details  Name: Elaine Wright MRN: [id number] Date of Birth: 17-Jul-1956 Referring Provider:   Flowsheet Row Cardiac Rehab from 09/06/2021 in [facility] Cardiac and Pulmonary Rehab  Referring Provider End, Harrell Gave MD       Encounter Date: 10/09/2021  Check In:  Session Check In - 10/09/21 1010       Check-In   Supervising physician immediately available to respond to emergencies See telemetry face sheet for immediately available ER MD    Location [facility]-Cardiac & Pulmonary Rehab    Staff Present Birdie Sons, MPA, RN;Melissa [place], RDN, Tawanna Solo, MS, ASCM CEP, Exercise Physiologist;Amanda Oletta Darter, BA, ACSM CEP, Exercise Physiologist    Virtual Visit No    Medication changes reported     No    Fall or balance concerns reported    No    Warm-up and Cool-down Performed on first and last piece of equipment    Resistance Training Performed Yes    VAD Patient? No    PAD/SET Patient? No      Pain Assessment   Currently in Pain? No/denies                Social History   Tobacco Use  Smoking Status Former   Packs/day: 0.50   Years: 45.00   Pack years: 22.50   Types: Cigarettes   Quit date: 03/11/2021   Years since quitting: 0.5  Smokeless Tobacco Never    Goals Met:  Independence with exercise equipment Exercise tolerated well Personal goals reviewed No report of concerns or symptoms today Strength training completed today  Goals Unmet:  Not Applicable  Comments: Pt able to follow exercise prescription today without complaint.  Will continue to monitor for progression.    Dr. Emily Filbert is Medical Director for [place].  Dr. Ottie Glazier is Medical Director for [facility] Pulmonary Rehabilitation.

## 2021-10-11 ENCOUNTER — Other Ambulatory Visit: Payer: Self-pay

## 2021-10-11 ENCOUNTER — Other Ambulatory Visit: Payer: Self-pay | Admitting: Family

## 2021-10-11 VITALS — Ht 62.0 in | Wt 148.5 lb

## 2021-10-11 DIAGNOSIS — Z48812 Encounter for surgical aftercare following surgery on the circulatory system: Secondary | ICD-10-CM | POA: Diagnosis not present

## 2021-10-11 DIAGNOSIS — I213 ST elevation (STEMI) myocardial infarction of unspecified site: Secondary | ICD-10-CM

## 2021-10-11 DIAGNOSIS — Z955 Presence of coronary angioplasty implant and graft: Secondary | ICD-10-CM

## 2021-10-11 DIAGNOSIS — E785 Hyperlipidemia, unspecified: Secondary | ICD-10-CM

## 2021-10-11 DIAGNOSIS — I25118 Atherosclerotic heart disease of native coronary artery with other forms of angina pectoris: Secondary | ICD-10-CM

## 2021-10-11 NOTE — Patient Instructions (Signed)
Discharge Patient Instructions  Patient Details  Name: Elaine Wright MRN: [id number] Date of Birth: 22-Dec-1955 Referring Provider:  Nelva Bush, MD   Number of Visits: 70  Reason for Discharge:  Patient reached a stable level of exercise. Patient independent in their exercise.  Smoking History:  Social History   Tobacco Use  Smoking Status Former   Packs/day: 0.50   Years: 45.00   Pack years: 22.50   Types: Cigarettes   Quit date: 03/11/2021   Years since quitting: 0.5  Smokeless Tobacco Never    Diagnosis:  ST elevation myocardial infarction (STEMI), unspecified artery (HCC)  Status post coronary artery stent placement  Initial Exercise Prescription:  Initial Exercise Prescription - 09/06/21 0900       Date of Initial Exercise RX and Referring Provider   Date 09/06/21    Referring Provider End, Harrell Gave MD      Oxygen   Maintain Oxygen Saturation 88% or higher      Treadmill   MPH 2.6    Grade 1    Minutes 15    METs 3.35      Elliptical   Level 1    Speed 2.7    Minutes 15    METs 3      REL-XR   Level 3    Speed 50    Minutes 15    METs 3      Prescription Details   Frequency (times per week) 2    Duration Progress to 30 minutes of continuous aerobic without signs/symptoms of physical distress      Intensity   THRR 40-80% of Max Heartrate 99-136    Ratings of Perceived Exertion 11-13    Perceived Dyspnea 0-4      Progression   Progression Continue to progress workloads to maintain intensity without signs/symptoms of physical distress.      Resistance Training   Training Prescription Yes    Weight 3lb    Reps 10-15             Discharge Exercise Prescription (Final Exercise Prescription Changes):  Exercise Prescription Changes - 10/10/21 1300       Response to Exercise   Blood Pressure (Admit) 120/78    Blood Pressure (Exercise) 122/78    Blood Pressure (Exit) 108/66    Heart Rate (Admit) 68 bpm    Heart Rate  (Exercise) 76 bpm    Heart Rate (Exit) 64 bpm    Rating of Perceived Exertion (Exercise) 13    Symptoms none    Duration Continue with 30 min of aerobic exercise without signs/symptoms of physical distress.    Intensity THRR unchanged      Progression   Progression Continue to progress workloads to maintain intensity without signs/symptoms of physical distress.    Average METs 5.14      Resistance Training   Training Prescription Yes    Weight 4 lb    Reps 10-15      Interval Training   Interval Training No      Treadmill   MPH 2.5    Grade 6    Minutes 15    METs 5.14      Home Exercise Plan   Plans to continue exercise at [employer] (comment)   [employer]   Frequency Add 3 additional days to program exercise sessions.    Initial Home Exercises Provided 10/09/21             Functional Capacity:  6 Minute Walk  [place] Name 09/06/21 0931 10/11/21 1003       6 Minute Walk   Phase Initial Discharge    Distance 1353 feet 1555 feet    Distance % Change -- 14.9 %    Distance Feet Change -- 202 ft    Walk Time 6 minutes 6 minutes    # of Rest Breaks 0 0    MPH 2.56 2.95    METS 3.09 3.51    RPE 9 12    VO2 Peak 10.82 12.28    Symptoms No No    Resting HR 62 bpm 70 bpm    Resting BP 122/62 142/70    Resting Oxygen Saturation  96 % --    Exercise Oxygen Saturation  during 6 min walk 98 % --    Max Ex. HR 85 bpm 84 bpm    Max Ex. BP 132/70 152/74    2 Minute Post BP 126/70 --            Nutrition & Weight - Outcomes:  Pre Biometrics - 09/06/21 0935       Pre Biometrics   Height 5\' 2"  (1.575 m)    Weight 143 lb 4.8 oz (65 kg)    BMI (Calculated) 26.2    Single Leg Stand 30 seconds             Post Biometrics - 10/11/21 1005        Post  Biometrics   Height 5\' 2"  (1.575 m)    Weight 148 lb 8 oz (67.4 kg)    BMI (Calculated) 27.15    Single Leg Stand 10.5 seconds             Nutrition:  Nutrition Therapy & Goals - 09/26/21 0956        Nutrition Therapy   Diet Heart healthy, low Na, diabetes friendly    Drug/Food Interactions Statins/Certain Fruits    Protein (specify units) 50g    Fiber 25 grams    Whole Grain Foods 3 servings    Saturated Fats 12 max. grams    Fruits and Vegetables 8 servings/day    Sodium 1.5 grams      Personal Nutrition Goals   Nutrition Goal ST: start reading food labels, add 1 fruit in either with soup at lunch or breakfast. LT: eat 8 fruits/vegetables per day, improve fruit/vegetable variety    Comments Last A1C 7.6 in May 2022, no new A1C. BG: ~130-140. B: cereal (great grains or special K with 2% milk) S: coffee L: was having salad at lunch, but now will have whole wheat sandwich - banana or soup (1/2 can) D: tries to cook healthy: beef 1-2x/week, usually chicken or lean pork. She uses an airfryer with olive oil. Will have potatoes or sweet potatoes a couple of times per week, beans, green beans, and vegetables from the garden. She likes to stick to the same vegetables, unless othe fruits and vegetables are on sale. Discussed heart healthy eating and diabetes friendly eating.      Intervention Plan   Intervention Prescribe, educate and counsel regarding individualized specific dietary modifications aiming towards targeted core components such as weight, hypertension, lipid management, diabetes, heart failure and other comorbidities.    Expected Outcomes Short Term Goal: Understand basic principles of dietary content, such as calories, fat, sodium, cholesterol and nutrients.;Short Term Goal: A plan has been developed with personal nutrition goals set during dietitian appointment.;Long Term Goal: Adherence to prescribed nutrition plan.  Goals reviewed with patient; copy given to patient.

## 2021-10-11 NOTE — Progress Notes (Signed)
Cardiac Individual Treatment Plan  Patient Details  Name: Elaine Wright MRN: [id number] Date of Birth: 1956-07-25 Referring Provider:   Flowsheet Row Cardiac Rehab from 09/06/2021 in [facility] Cardiac and Pulmonary Rehab  Referring Provider End, Harrell Gave MD       Initial Encounter Date:  Flowsheet Row Cardiac Rehab from 09/06/2021 in [facility] Cardiac and Pulmonary Rehab  Date 09/06/21       Visit Diagnosis: ST elevation myocardial infarction (STEMI), unspecified artery [facility])  Status post coronary artery stent placement  Patient's Home Medications on Admission:  Current Outpatient Medications:    albuterol (VENTOLIN HFA) 108 (90 Base) MCG/ACT inhaler, Inhale 2 puffs into the lungs every 4 (four) hours as needed for wheezing or shortness of breath., Disp: , Rfl:    alendronate (FOSAMAX) 70 MG tablet, Take 70 mg by mouth every Friday., Disp: , Rfl:    aspirin 81 MG chewable tablet, Chew 1 tablet (81 mg total) by mouth daily., Disp: , Rfl:    atorvastatin (LIPITOR) 80 MG tablet, Take 1 tablet (80 mg total) by mouth daily., Disp: 90 tablet, Rfl: 1   benzonatate (TESSALON) 100 MG capsule, Take 100 mg by mouth 3 (three) times daily., Disp: , Rfl:    Carboxymethylcellulose Sodium (THERATEARS OP), Place 1 drop into both eyes daily as needed (when using contacts)., Disp: , Rfl:    dextromethorphan-guaiFENesin (MUCINEX DM) 30-600 MG 12hr tablet, Take 1 tablet by mouth 2 (two) times daily as needed for cough., Disp: , Rfl:    ipratropium-albuterol (DUONEB) 0.5-2.5 (3) MG/3ML SOLN, Inhale 3 mLs into the lungs every 6 (six) hours as needed., Disp: 360 mL, Rfl:    metFORMIN (GLUCOPHAGE-XR) 500 MG 24 hr tablet, Take 500 mg by mouth 2 (two) times daily., Disp: , Rfl:    metoprolol succinate (TOPROL-XL) 25 MG 24 hr tablet, Take 1 tablet (25 mg total) by mouth daily., Disp: 90 tablet, Rfl: 1   metroNIDAZOLE (METROCREAM) 0.75 % cream, Apply topically at bedtime. qhs to face for Rosacea, Disp: 45 g, Rfl:  6   Multiple Vitamin (MULTI-VITAMIN) tablet, Take 1 tablet by mouth daily., Disp: , Rfl:    nystatin (MYCOSTATIN) 100000 UNIT/ML suspension, Take 5 mLs by mouth 4 (four) times daily., Disp: , Rfl:    pantoprazole (PROTONIX) 40 MG tablet, Take 1 tablet (40 mg total) by mouth daily., Disp: 30 tablet, Rfl: 0   predniSONE (DELTASONE) 20 MG tablet, Take 1 tablet (20 mg total) by mouth daily with breakfast., Disp: 5 tablet, Rfl: 0   ticagrelor (BRILINTA) 90 MG TABS tablet, Take 1 tablet (90 mg total) by mouth 2 (two) times daily., Disp: 60 tablet, Rfl: 11   tiotropium (SPIRIVA) 18 MCG inhalation capsule, Place 1 capsule (18 mcg total) into inhaler and inhale daily., Disp: 30 capsule, Rfl: 12  Past Medical History: Past Medical History:  Diagnosis Date   Arthritis    Diabetes mellitus without complication (HCC)    GERD (gastroesophageal reflux disease)    Sleep apnea     Tobacco Use: Social History   Tobacco Use  Smoking Status Former   Packs/day: 0.50   Years: 45.00   Pack years: 22.50   Types: Cigarettes   Quit date: 03/11/2021   Years since quitting: 0.5  Smokeless Tobacco Never    Labs: Recent Review Flowsheet Data     Labs for ITP Cardiac and Pulmonary Rehab Latest Ref Rng & Units 03/24/2021 03/26/2021 05/13/2021   Cholestrol 0 - 200 mg/dL - 142 -  LDLCALC 0 - 99 mg/dL - 68 -   HDL >40 mg/dL - 62 -   Trlycerides <150 mg/dL - 61 -   Hemoglobin A1c 4.8 - 5.6 % 6.9(H) - 7.6(H)   PHART 7.350 - 7.450 7.25(L) - -   PCO2ART 32.0 - 48.0 mmHg 59(H) - -   HCO3 20.0 - 28.0 mmol/L 25.9 - -   ACIDBASEDEF 0.0 - 2.0 mmol/L 2.4(H) - -   O2SAT % 99.5 - -        Exercise Target Goals: Exercise Program Goal: Individual exercise prescription set using results from initial 6 min walk test and THRR while considering  patient's activity barriers and safety.   Exercise Prescription Goal: Initial exercise prescription builds to 30-45 minutes a day of aerobic activity, 2-3 days per week.  Home  exercise guidelines will be given to patient during program as part of exercise prescription that the participant will acknowledge.   Education: Aerobic Exercise: - Group verbal and visual presentation on the components of exercise prescription. Introduces F.I.T.T principle from ACSM for exercise prescriptions.  Reviews F.I.T.T. principles of aerobic exercise including progression. Written material given at graduation.   Education: Resistance Exercise: - Group verbal and visual presentation on the components of exercise prescription. Introduces F.I.T.T principle from ACSM for exercise prescriptions  Reviews F.I.T.T. principles of resistance exercise including progression. Written material given at graduation.    Education: Exercise & Equipment Safety: - Individual verbal instruction and demonstration of equipment use and safety with use of the equipment. Flowsheet Row Cardiac Rehab from 10/04/2021 in [facility] Cardiac and Pulmonary Rehab  Date 09/06/21  Educator [facility]  Instruction Review Code 1- Verbalizes Understanding       Education: Exercise Physiology & General Exercise Guidelines: - Group verbal and written instruction with models to review the exercise physiology of the cardiovascular system and associated critical values. Provides general exercise guidelines with specific guidelines to those with heart or lung disease.  Flowsheet Row Cardiac Rehab from 10/04/2021 in [facility] Cardiac and Pulmonary Rehab  Education need identified 09/06/21  Date 10/04/21  Educator [facility]  Instruction Review Code 1- [employer] Understanding       Education: Flexibility, Balance, Mind/Body Relaxation: - Group verbal and visual presentation with interactive activity on the components of exercise prescription. Introduces F.I.T.T principle from ACSM for exercise prescriptions. Reviews F.I.T.T. principles of flexibility and balance exercise training including progression. Also discusses the mind body connection.   Reviews various relaxation techniques to help reduce and manage stress (i.e. Deep breathing, progressive muscle relaxation, and visualization). Balance handout provided to take home. Written material given at graduation.   Activity Barriers & Risk Stratification:  Activity Barriers & Cardiac Risk Stratification - 09/06/21 0933       Activity Barriers & Cardiac Risk Stratification   Activity Barriers Arthritis;Left Knee Replacement;Right Knee Replacement;Deconditioning    Comments arthritis neck down on left side, R knee is a partial    Cardiac Risk Stratification High             6 Minute Walk:  6 Minute Walk     Row Name 09/06/21 0931         6 Minute Walk   Phase Initial     Distance 1353 feet     Walk Time 6 minutes     # of Rest Breaks 0     MPH 2.56     METS 3.09     RPE 9     VO2 Peak 10.82  Symptoms No     Resting HR 62 bpm     Resting BP 122/62     Resting Oxygen Saturation  96 %     Exercise Oxygen Saturation  during 6 min walk 98 %     Max Ex. HR 85 bpm     Max Ex. BP 132/70     2 Minute Post BP 126/70              Oxygen Initial Assessment:   Oxygen Re-Evaluation:   Oxygen Discharge (Final Oxygen Re-Evaluation):   Initial Exercise Prescription:  Initial Exercise Prescription - 09/06/21 0900       Date of Initial Exercise RX and Referring Provider   Date 09/06/21    Referring Provider End, Harrell Gave MD      Oxygen   Maintain Oxygen Saturation 88% or higher      Treadmill   MPH 2.6    Grade 1    Minutes 15    METs 3.35      Elliptical   Level 1    Speed 2.7    Minutes 15    METs 3      REL-XR   Level 3    Speed 50    Minutes 15    METs 3      Prescription Details   Frequency (times per week) 2    Duration Progress to 30 minutes of continuous aerobic without signs/symptoms of physical distress      Intensity   THRR 40-80% of Max Heartrate 99-136    Ratings of Perceived Exertion 11-13    Perceived Dyspnea 0-4       Progression   Progression Continue to progress workloads to maintain intensity without signs/symptoms of physical distress.      Resistance Training   Training Prescription Yes    Weight 3lb    Reps 10-15             Perform Capillary Blood Glucose checks as needed.  Exercise Prescription Changes:   Exercise Prescription Changes     Row Name 09/06/21 0900 09/12/21 1200 09/24/21 1300 10/09/21 1100 10/10/21 1300     Response to Exercise   Blood Pressure (Admit) 122/62 132/70 122/64 -- 120/78   Blood Pressure (Exercise) 132/70 144/70 144/76 -- 122/78   Blood Pressure (Exit) 126/70 104/56 102/62 -- 108/66   Heart Rate (Admit) 62 bpm 61 bpm 70 bpm -- 68 bpm   Heart Rate (Exercise) 85 bpm 126 bpm 104 bpm -- 76 bpm   Heart Rate (Exit) 66 bpm 63 bpm 76 bpm -- 64 bpm   Oxygen Saturation (Admit) 96 % -- -- -- --   Oxygen Saturation (Exercise) 98 % -- -- -- --   Rating of Perceived Exertion (Exercise) 9 11 15  -- 13   Symptoms none none none -- none   Comments walk test results first day third full day -- --   Duration -- -- Progress to 30 minutes of  aerobic without signs/symptoms of physical distress -- Continue with 30 min of aerobic exercise without signs/symptoms of physical distress.   Intensity -- -- THRR unchanged -- THRR unchanged     Progression   Progression -- Continue to progress workloads to maintain intensity without signs/symptoms of physical distress. Continue to progress workloads to maintain intensity without signs/symptoms of physical distress. -- Continue to progress workloads to maintain intensity without signs/symptoms of physical distress.   Average METs -- 2.9 2.98 -- 5.14  Resistance Training   Training Prescription -- Yes Yes -- Yes   Weight -- 3 lb 3 lb -- 4 lb   Reps -- 10-15 10-15 -- 10-15     Interval Training   Interval Training -- -- No -- No     Treadmill   MPH -- 2.6 2.6 -- 2.5   Grade -- 1 1 -- 6   Minutes -- 15 15 -- 15   METs --  3.35 3.35 -- 5.14     NuStep   Level -- 3 -- -- --   SPM -- 80 -- -- --   Minutes -- 15 -- -- --   METs -- 2.4 -- -- --     Elliptical   Level -- -- 2.1 -- --   Speed -- -- 2.9 -- --   Minutes -- -- 15 -- --   METs -- -- 2.6 -- --     REL-XR   Level -- -- 4 -- --   Minutes -- -- 15 -- --     Home Exercise Plan   Plans to continue exercise at -- -- -- [employer] (comment)  [employer] (comment)  [employer]   Frequency -- -- -- Add 3 additional days to program exercise sessions. Add 3 additional days to program exercise sessions.   Initial Home Exercises Provided -- -- -- 10/09/21 10/09/21            Exercise Comments:   Exercise Comments     Row Name 09/11/21 0947 10/11/21 0954         Exercise Comments First full day of exercise!  Patient was oriented to gym and equipment including functions, settings, policies, and procedures.  Patient's individual exercise prescription and treatment plan were reviewed.  All starting workloads were established based on the results of the 6 minute walk test done at initial orientation visit.  The plan for exercise progression was also introduced and progression will be customized based on patient's performance and goals. Elaine Wright graduated today from  rehab with 11 sessions completed.  Details of the patient's exercise prescription and what She needs to do in order to continue the prescription and progress were discussed with patient.  Patient was given a copy of prescription and goals.  Patient verbalized understanding.  Elaine Wright plans to continue to exercise by going to [facility] and attending exercise classes.               Exercise Goals and Review:   Exercise Goals     Row Name 09/06/21 0935             Exercise Goals   Increase Physical Activity Yes       Intervention Provide advice, education, support and counseling about physical activity/exercise needs.;Develop an individualized exercise prescription for  aerobic and resistive training based on initial evaluation findings, risk stratification, comorbidities and participant's personal goals.       Expected Outcomes Short Term: Attend rehab on a regular basis to increase amount of physical activity.;Long Term: Add in home exercise to make exercise part of routine and to increase amount of physical activity.;Long Term: Exercising regularly at least 3-5 days a week.       Increase Strength and Stamina Yes       Intervention Provide advice, education, support and counseling about physical activity/exercise needs.;Develop an individualized exercise prescription for aerobic and resistive training based on initial evaluation findings, risk stratification, comorbidities and participant's personal goals.  Expected Outcomes Short Term: Increase workloads from initial exercise prescription for resistance, speed, and METs.;Short Term: Perform resistance training exercises routinely during rehab and add in resistance training at home;Long Term: Improve cardiorespiratory fitness, muscular endurance and strength as measured by increased METs and functional capacity (6MWT)       Able to understand and use rate of perceived exertion (RPE) scale Yes       Intervention Provide education and explanation on how to use RPE scale       Expected Outcomes Short Term: Able to use RPE daily in rehab to express subjective intensity level;Long Term:  Able to use RPE to guide intensity level when exercising independently       Able to understand and use Dyspnea scale Yes       Intervention Provide education and explanation on how to use Dyspnea scale       Expected Outcomes Long Term: Able to use Dyspnea scale to guide intensity level when exercising independently;Short Term: Able to use Dyspnea scale daily in rehab to express subjective sense of shortness of breath during exertion       Knowledge and understanding of Target Heart Rate Range (THRR) Yes       Intervention Provide  education and explanation of THRR including how the numbers were predicted and where they are located for reference       Expected Outcomes Short Term: Able to state/look up THRR;Short Term: Able to use daily as guideline for intensity in rehab;Long Term: Able to use THRR to govern intensity when exercising independently       Able to check pulse independently Yes       Intervention Provide education and demonstration on how to check pulse in carotid and radial arteries.;Review the importance of being able to check your own pulse for safety during independent exercise       Expected Outcomes Short Term: Able to explain why pulse checking is important during independent exercise;Long Term: Able to check pulse independently and accurately       Understanding of Exercise Prescription Yes       Intervention Provide education, explanation, and written materials on patient's individual exercise prescription       Expected Outcomes Short Term: Able to explain program exercise prescription;Long Term: Able to explain home exercise prescription to exercise independently                Exercise Goals Re-Evaluation :  Exercise Goals Re-Evaluation     Row Name 09/11/21 0947 09/24/21 1357 10/09/21 1021         Exercise Goal Re-Evaluation   Exercise Goals Review Increase Physical Activity;Able to understand and use rate of perceived exertion (RPE) scale;Knowledge and understanding of Target Heart Rate Range (THRR);Understanding of Exercise Prescription;Increase Strength and Stamina;Able to understand and use Dyspnea scale;Able to check pulse independently Increase Physical Activity;Increase Strength and Stamina;Understanding of Exercise Prescription Increase Physical Activity;Increase Strength and Stamina;Understanding of Exercise Prescription     Comments Reviewed RPE and dyspnea scales, THR and program prescription with pt today.  Pt voiced understanding and was given a copy of goals to take home. Elaine Wright  is off to a good start in rehab.  She has completed her first three full days of exericse thus far.  She is already at 2.6 METs on the elliptical.  We will continue to monitor her progress. Elaine Wright is already doing home exercise and is attending the [facility] 5 days/ week. Reviewed home exercise with pt today.  [facility] Patient participates in aerobic classes that includes resistance training. She feels that rehab may not be the best fit for her and we discussed early graduation per patient's request. THR, pulse, RPE, sign and symptoms, pulse oximetery and when to call 911 or MD.  Also discussed weather considerations and indoor options.  Pt voiced understanding.     Expected Outcomes Short: Use RPE daily to regulate intensity. Long: Follow program prescription in THR. Short: Continue to attend rehab regularly Long: Continue to follow program prescription. Short: Monitor HR during exercise Long: Continue to exercise independently at home at appropriate prescription              Discharge Exercise Prescription (Final Exercise Prescription Changes):  Exercise Prescription Changes - 10/10/21 1300       Response to Exercise   Blood Pressure (Admit) 120/78    Blood Pressure (Exercise) 122/78    Blood Pressure (Exit) 108/66    Heart Rate (Admit) 68 bpm    Heart Rate (Exercise) 76 bpm    Heart Rate (Exit) 64 bpm    Rating of Perceived Exertion (Exercise) 13    Symptoms none    Duration Continue with 30 min of aerobic exercise without signs/symptoms of physical distress.    Intensity THRR unchanged      Progression   Progression Continue to progress workloads to maintain intensity without signs/symptoms of physical distress.    Average METs 5.14      Resistance Training   Training Prescription Yes    Weight 4 lb    Reps 10-15      Interval Training   Interval Training No      Treadmill   MPH 2.5    Grade 6    Minutes 15    METs 5.14      Home Exercise Plan    Plans to continue exercise at [employer] (comment)   [employer]   Frequency Add 3 additional days to program exercise sessions.    Initial Home Exercises Provided 10/09/21             Nutrition:  Target Goals: Understanding of nutrition guidelines, daily intake of sodium 1500mg , cholesterol 200mg , calories 30% from fat and 7% or less from saturated fats, daily to have 5 or more servings of fruits and vegetables.  Education: All About Nutrition: -Group instruction provided by verbal, written material, interactive activities, discussions, models, and posters to present general guidelines for heart healthy nutrition including fat, fiber, MyPlate, the role of sodium in heart healthy nutrition, utilization of the nutrition label, and utilization of this knowledge for meal planning. Follow up email sent as well. Written material given at graduation. Flowsheet Row Cardiac Rehab from 10/04/2021 in [facility] Cardiac and Pulmonary Rehab  Education need identified 09/06/21       Biometrics:  Pre Biometrics - 09/06/21 0935       Pre Biometrics   Height 5\' 2"  (1.575 m)    Weight 143 lb 4.8 oz (65 kg)    BMI (Calculated) 26.2    Single Leg Stand 30 seconds              Nutrition Therapy Plan and Nutrition Goals:  Nutrition Therapy & Goals - 09/26/21 0956       Nutrition Therapy   Diet Heart healthy, low Na, diabetes friendly    Drug/Food Interactions Statins/Certain Fruits    Protein (specify units) 50g    Fiber 25 grams  Whole Grain Foods 3 servings    Saturated Fats 12 max. grams    Fruits and Vegetables 8 servings/day    Sodium 1.5 grams      Personal Nutrition Goals   Nutrition Goal ST: start reading food labels, add 1 fruit in either with soup at lunch or breakfast. LT: eat 8 fruits/vegetables per day, improve fruit/vegetable variety    Comments Last A1C 7.6 in May 2022, no new A1C. BG: ~130-140. B: cereal (great grains or special K with 2% milk) S: coffee L: was  having salad at lunch, but now will have whole wheat sandwich - banana or soup (1/2 can) D: tries to cook healthy: beef 1-2x/week, usually chicken or lean pork. She uses an airfryer with olive oil. Will have potatoes or sweet potatoes a couple of times per week, beans, green beans, and vegetables from the garden. She likes to stick to the same vegetables, unless othe fruits and vegetables are on sale. Discussed heart healthy eating and diabetes friendly eating.      Intervention Plan   Intervention Prescribe, educate and counsel regarding individualized specific dietary modifications aiming towards targeted core components such as weight, hypertension, lipid management, diabetes, heart failure and other comorbidities.    Expected Outcomes Short Term Goal: Understand basic principles of dietary content, such as calories, fat, sodium, cholesterol and nutrients.;Short Term Goal: A plan has been developed with personal nutrition goals set during dietitian appointment.;Long Term Goal: Adherence to prescribed nutrition plan.             Nutrition Assessments:  MEDIFICTS Score Key: ?70 Need to make dietary changes  40-70 Heart Healthy Diet ? 40 Therapeutic Level Cholesterol Diet  Flowsheet Row Cardiac Rehab from 09/06/2021 in [facility] Cardiac and Pulmonary Rehab  Picture Your Plate Total Score on Admission 73      Picture Your Plate Scores: <10 Unhealthy dietary pattern with much room for improvement. 41-50 Dietary pattern unlikely to meet recommendations for good health and room for improvement. 51-60 More healthful dietary pattern, with some room for improvement.  >60 Healthy dietary pattern, although there may be some specific behaviors that could be improved.    Nutrition Goals Re-Evaluation:   Nutrition Goals Discharge (Final Nutrition Goals Re-Evaluation):   Psychosocial: Target Goals: Acknowledge presence or absence of significant depression and/or stress, maximize coping skills,  provide positive support system. Participant is able to verbalize types and ability to use techniques and skills needed for reducing stress and depression.   Education: Stress, Anxiety, and Depression - Group verbal and visual presentation to define topics covered.  Reviews how body is impacted by stress, anxiety, and depression.  Also discusses healthy ways to reduce stress and to treat/manage anxiety and depression.  Written material given at graduation.   Education: Sleep Hygiene -Provides group verbal and written instruction about how sleep can affect your health.  Define sleep hygiene, discuss sleep cycles and impact of sleep habits. Review good sleep hygiene tips.    Initial Review & Psychosocial Screening:  Initial Psych Review & Screening - 08/23/21 1349       Initial Review   Current issues with None Identified      Family Dynamics   Good Support System? Yes   husband, daughter, grandkids, friends, people at the gym   Concerns Recent loss of significant other    Comments son in law- ex.  recent death      Barriers   Psychosocial barriers to participate in program There are no identifiable  barriers or psychosocial needs.      Screening Interventions   Interventions Encouraged to exercise;To provide support and resources with identified psychosocial needs;Provide feedback about the scores to participant    Expected Outcomes Short Term goal: Utilizing psychosocial counselor, staff and physician to assist with identification of specific Stressors or current issues interfering with healing process. Setting desired goal for each stressor or current issue identified.;Long Term Goal: Stressors or current issues are controlled or eliminated.;Short Term goal: Identification and review with participant of any Quality of Life or Depression concerns found by scoring the questionnaire.;Long Term goal: The participant improves quality of Life and PHQ9 Scores as seen by post scores and/or  verbalization of changes             Quality of Life Scores:   Quality of Life - 09/06/21 0936       Quality of Life   Select Quality of Life   updated in person     Quality of Life Scores   Health/Function Pre 28 %    Socioeconomic Pre 29.64 %    Psych/Spiritual Pre 29.14 %    Family Pre 30 %    GLOBAL Pre 28.87 %            Scores of 19 and below usually indicate a poorer quality of life in these areas.  A difference of  2-3 points is a clinically meaningful difference.  A difference of 2-3 points in the total score of the Quality of Life Index has been associated with significant improvement in overall quality of life, self-image, physical symptoms, and general health in studies assessing change in quality of life.  PHQ-9: Recent Review Flowsheet Data     Depression screen [facility] 2/9 09/06/2021   Decreased Interest 0   Down, Depressed, Hopeless 1   PHQ - 2 Score 1   Altered sleeping 2   Tired, decreased energy 2   Change in appetite 0   Feeling bad or failure about yourself  0   Trouble concentrating 0   Moving slowly or fidgety/restless 0   Suicidal thoughts 0   PHQ-9 Score 5   Difficult doing work/chores Somewhat difficult      Interpretation of Total Score  Total Score Depression Severity:  1-4 = Minimal depression, 5-9 = Mild depression, 10-14 = Moderate depression, 15-19 = Moderately severe depression, 20-27 = Severe depression   Psychosocial Evaluation and Intervention:  Psychosocial Evaluation - 08/23/21 1410       Psychosocial Evaluation & Interventions   Interventions Encouraged to exercise with the program and follow exercise prescription    Comments Elaine Wright has no barriers to attending the program. She has a great support system- husband,daughter, friends and the people she exercises with.  she has had a recent loss: her daughter's ex husband died recently.  SHe is also concerned that she is not getting better with her shortness of breath and  coughing. She does have a referral to an Asthma and Allergy doctor at [facility].  Her goal is to gain better control of her symptoms and get back to her norm.    Expected Outcomes STG: attend all scheduled sessions , gain better control of breathing (shortness of breath)LTG: Elaine Wright will be able to continue her progress after discharge    Continue Psychosocial Services  Follow up required by staff             Psychosocial Re-Evaluation:   Psychosocial Discharge (Final Psychosocial Re-Evaluation):   Vocational Rehabilitation: Provide  vocational rehab assistance to qualifying candidates.   Vocational Rehab Evaluation & Intervention:  Vocational Rehab - 08/23/21 1400       Initial Vocational Rehab Evaluation & Intervention   Assessment shows need for Vocational Rehabilitation No             Education: Education Goals: Education classes will be provided on a variety of topics geared toward better understanding of heart health and risk factor modification. Participant will state understanding/return demonstration of topics presented as noted by education test scores.  Learning Barriers/Preferences:  Learning Barriers/Preferences - 08/23/21 1356       Learning Barriers/Preferences   Learning Barriers None    Learning Preferences None             General Cardiac Education Topics:  AED/CPR: - Group verbal and written instruction with the use of models to demonstrate the basic use of the AED with the basic ABC's of resuscitation.   Anatomy and Cardiac Procedures: - Group verbal and visual presentation and models provide information about basic cardiac anatomy and function. Reviews the testing methods done to diagnose heart disease and the outcomes of the test results. Describes the treatment choices: Medical Management, Angioplasty, or Coronary Bypass Surgery for treating various heart conditions including Myocardial Infarction, Angina, Valve Disease, and Cardiac Arrhythmias.   Written material given at graduation. Flowsheet Row Cardiac Rehab from 10/04/2021 in [facility] Cardiac and Pulmonary Rehab  Education need identified 09/06/21       Medication Safety: - Group verbal and visual instruction to review commonly prescribed medications for heart and lung disease. Reviews the medication, class of the drug, and side effects. Includes the steps to properly store meds and maintain the prescription regimen.  Written material given at graduation.   Intimacy: - Group verbal instruction through game format to discuss how heart and lung disease can affect sexual intimacy. Written material given at graduation..   Know Your Numbers and Heart Failure: - Group verbal and visual instruction to discuss disease risk factors for cardiac and pulmonary disease and treatment options.  Reviews associated critical values for Overweight/Obesity, Hypertension, Cholesterol, and Diabetes.  Discusses basics of heart failure: signs/symptoms and treatments.  Introduces Heart Failure Zone chart for action plan for heart failure.  Written material given at graduation. Flowsheet Row Cardiac Rehab from 10/04/2021 in [facility] Cardiac and Pulmonary Rehab  Education need identified 09/06/21       Infection Prevention: - Provides verbal and written material to individual with discussion of infection control including proper hand washing and proper equipment cleaning during exercise session. Flowsheet Row Cardiac Rehab from 10/04/2021 in [facility] Cardiac and Pulmonary Rehab  Date 09/06/21  Educator [facility]  Instruction Review Code 1- Verbalizes Understanding       Falls Prevention: - Provides verbal and written material to individual with discussion of falls prevention and safety. Flowsheet Row Cardiac Rehab from 10/04/2021 in [facility] Cardiac and Pulmonary Rehab  Date 08/23/21  Educator SB  Instruction Review Code 1- Verbalizes Understanding       Other: -Provides group and verbal instruction on various  topics (see comments)   Knowledge Questionnaire Score:  Knowledge Questionnaire Score - 09/06/21 0937       Knowledge Questionnaire Score   Pre Score 20/26             Core Components/Risk Factors/Patient Goals at Admission:  Personal Goals and Risk Factors at Admission - 09/06/21 0937       Core Components/Risk Factors/Patient Goals on Admission  Weight Management Yes;Weight Loss    Intervention Weight Management: Develop a combined nutrition and exercise program designed to reach desired caloric intake, while maintaining appropriate intake of nutrient and fiber, sodium and fats, and appropriate energy expenditure required for the weight goal.;Weight Management: Provide education and appropriate resources to help participant work on and attain dietary goals.    Admit Weight 143 lb 4.8 oz (65 kg)    Goal Weight: Short Term 138 lb (62.6 kg)    Goal Weight: Long Term 132 lb (59.9 kg)    Expected Outcomes Short Term: Continue to assess and modify interventions until short term weight is achieved;Long Term: Adherence to nutrition and physical activity/exercise program aimed toward attainment of established weight goal;Weight Loss: Understanding of general recommendations for a balanced deficit meal plan, which promotes 1-2 lb weight loss per week and includes a negative energy balance of [phone] kcal/d;Understanding recommendations for meals to include 15-35% energy as protein, 25-35% energy from fat, 35-60% energy from carbohydrates, less than 200mg  of dietary cholesterol, 20-35 gm of total fiber daily;Understanding of distribution of calorie intake throughout the day with the consumption of 4-5 meals/snacks    Increase knowledge of respiratory medications and ability to use respiratory devices properly  Yes    Intervention Provide education and demonstration as needed of appropriate use of medications, inhalers, and oxygen therapy.    Expected Outcomes Short Term: Achieves understanding  of medications use. Understands that oxygen is a medication prescribed by physician. Demonstrates appropriate use of inhaler and oxygen therapy.;Long Term: Maintain appropriate use of medications, inhalers, and oxygen therapy.    Diabetes Yes    Intervention Provide education about signs/symptoms and action to take for hypo/hyperglycemia.;Provide education about proper nutrition, including hydration, and aerobic/resistive exercise prescription along with prescribed medications to achieve blood glucose in normal ranges: Fasting glucose 65-99 mg/dL    Expected Outcomes Short Term: Participant verbalizes understanding of the signs/symptoms and immediate care of hyper/hypoglycemia, proper foot care and importance of medication, aerobic/resistive exercise and nutrition plan for blood glucose control.;Long Term: Attainment of HbA1C < 7%.    Heart Failure Yes    Intervention Provide a combined exercise and nutrition program that is supplemented with education, support and counseling about heart failure. Directed toward relieving symptoms such as shortness of breath, decreased exercise tolerance, and extremity edema.    Expected Outcomes Improve functional capacity of life;Short term: Attendance in program 2-3 days a week with increased exercise capacity. Reported lower sodium intake. Reported increased fruit and vegetable intake. Reports medication compliance.;Short term: Daily weights obtained and reported for increase. Utilizing diuretic protocols set by physician.    Lipids Yes    Intervention Provide education and support for participant on nutrition & aerobic/resistive exercise along with prescribed medications to achieve LDL 70mg , HDL >40mg .    Expected Outcomes Short Term: Participant states understanding of desired cholesterol values and is compliant with medications prescribed. Participant is following exercise prescription and nutrition guidelines.;Long Term: Cholesterol controlled with medications as  prescribed, with individualized exercise RX and with personalized nutrition plan. Value goals: LDL < 70mg , HDL > 40 mg.             Education:Diabetes - Individual verbal and written instruction to review signs/symptoms of diabetes, desired ranges of glucose level fasting, after meals and with exercise. Acknowledge that pre and post exercise glucose checks will be done for 3 sessions at entry of program.   Core Components/Risk Factors/Patient Goals Review:    Core Components/Risk Factors/Patient Goals at  Discharge (Final Review):    ITP Comments:  ITP Comments     Row Name 08/23/21 1409 09/06/21 0931 09/11/21 0947 09/19/21 0929 09/26/21 1143   ITP Comments Virtual orientation call completed today. shehas an appointment on Date: 09/06/2021  for EP eval and gym Orientation.  Documentation of diagnosis can be found in [facility]  Date: 03/24/21 . Completed 6MWT and gym orientation. Initial ITP created and sent for review to Dr. Emily Filbert, Medical Director. First full day of exercise!  Patient was oriented to gym and equipment including functions, settings, policies, and procedures.  Patient's individual exercise prescription and treatment plan were reviewed.  All starting workloads were established based on the results of the 6 minute walk test done at initial orientation visit.  The plan for exercise progression was also introduced and progression will be customized based on patient's performance and goals. 30 day review completed. ITP sent to Dr. Emily Filbert, Medical Director of Cardiac Rehab. Continue with ITP unless changes are made by physician. Completed initial RD evaluation    Row Name 10/11/21 0954           ITP Comments Rosha graduated today from  rehab with 11 sessions completed.  Details of the patient's exercise prescription and what She needs to do in order to continue the prescription and progress were discussed with patient.  Patient was given a copy of prescription and goals.   Patient verbalized understanding.  Fajr plans to continue to exercise by going to [facility] and attending exercise classes.                Comments: discharge ITP

## 2021-10-11 NOTE — Progress Notes (Signed)
Discharge Progress Report  Patient Details  Name: Elaine Wright MRN: [id number] Date of Birth: 09/05/1956 Referring Provider:   Flowsheet Row Cardiac Rehab from 09/06/2021 in [facility] Cardiac and Pulmonary Rehab  Referring Provider End, Harrell Gave MD        Number of Visits: 11  Reason for Discharge:  Early Exit:  Patient wanted to graduate early for personal reasons.   Smoking History:  Social History   Tobacco Use  Smoking Status Former   Packs/day: 0.50   Years: 45.00   Pack years: 22.50   Types: Cigarettes   Quit date: 03/11/2021   Years since quitting: 0.5  Smokeless Tobacco Never    Diagnosis:  ST elevation myocardial infarction (STEMI), unspecified artery (HCC)  Status post coronary artery stent placement  ADL UCSD:   Initial Exercise Prescription:  Initial Exercise Prescription - 09/06/21 0900       Date of Initial Exercise RX and Referring Provider   Date 09/06/21    Referring Provider End, Harrell Gave MD      Oxygen   Maintain Oxygen Saturation 88% or higher      Treadmill   MPH 2.6    Grade 1    Minutes 15    METs 3.35      Elliptical   Level 1    Speed 2.7    Minutes 15    METs 3      REL-XR   Level 3    Speed 50    Minutes 15    METs 3      Prescription Details   Frequency (times per week) 2    Duration Progress to 30 minutes of continuous aerobic without signs/symptoms of physical distress      Intensity   THRR 40-80% of Max Heartrate 99-136    Ratings of Perceived Exertion 11-13    Perceived Dyspnea 0-4      Progression   Progression Continue to progress workloads to maintain intensity without signs/symptoms of physical distress.      Resistance Training   Training Prescription Yes    Weight 3lb    Reps 10-15             Discharge Exercise Prescription (Final Exercise Prescription Changes):  Exercise Prescription Changes - 10/10/21 1300       Response to Exercise   Blood Pressure (Admit) 120/78    Blood  Pressure (Exercise) 122/78    Blood Pressure (Exit) 108/66    Heart Rate (Admit) 68 bpm    Heart Rate (Exercise) 76 bpm    Heart Rate (Exit) 64 bpm    Rating of Perceived Exertion (Exercise) 13    Symptoms none    Duration Continue with 30 min of aerobic exercise without signs/symptoms of physical distress.    Intensity THRR unchanged      Progression   Progression Continue to progress workloads to maintain intensity without signs/symptoms of physical distress.    Average METs 5.14      Resistance Training   Training Prescription Yes    Weight 4 lb    Reps 10-15      Interval Training   Interval Training No      Treadmill   MPH 2.5    Grade 6    Minutes 15    METs 5.14      Home Exercise Plan   Plans to continue exercise at [employer] (comment)   [employer]   Frequency Add 3 additional days to program exercise sessions.  Initial Home Exercises Provided 10/09/21             Functional Capacity:  6 Minute Walk     Row Name 09/06/21 0931         6 Minute Walk   Phase Initial     Distance 1353 feet     Walk Time 6 minutes     # of Rest Breaks 0     MPH 2.56     METS 3.09     RPE 9     VO2 Peak 10.82     Symptoms No     Resting HR 62 bpm     Resting BP 122/62     Resting Oxygen Saturation  96 %     Exercise Oxygen Saturation  during 6 min walk 98 %     Max Ex. HR 85 bpm     Max Ex. BP 132/70     2 Minute Post BP 126/70              Psychological, QOL, Others - Outcomes: PHQ 2/9: Depression screen PHQ 2/9 09/06/2021  Decreased Interest 0  Down, Depressed, Hopeless 1  PHQ - 2 Score 1  Altered sleeping 2  Tired, decreased energy 2  Change in appetite 0  Feeling bad or failure about yourself  0  Trouble concentrating 0  Moving slowly or fidgety/restless 0  Suicidal thoughts 0  PHQ-9 Score 5  Difficult doing work/chores Somewhat difficult    Quality of Life:  Quality of Life - 09/06/21 0936       Quality of Life   Select Quality of  Life   updated in person     Quality of Life Scores   Health/Function Pre 28 %    Socioeconomic Pre 29.64 %    Psych/Spiritual Pre 29.14 %    Family Pre 30 %    GLOBAL Pre 28.87 %                Nutrition & Weight - Outcomes:  Pre Biometrics - 09/06/21 0935       Pre Biometrics   Height 5\' 2"  (1.575 m)    Weight 143 lb 4.8 oz (65 kg)    BMI (Calculated) 26.2    Single Leg Stand 30 seconds              Nutrition:  Nutrition Therapy & Goals - 09/26/21 0956       Nutrition Therapy   Diet Heart healthy, low Na, diabetes friendly    Drug/Food Interactions Statins/Certain Fruits    Protein (specify units) 50g    Fiber 25 grams    Whole Grain Foods 3 servings    Saturated Fats 12 max. grams    Fruits and Vegetables 8 servings/day    Sodium 1.5 grams      Personal Nutrition Goals   Nutrition Goal ST: start reading food labels, add 1 fruit in either with soup at lunch or breakfast. LT: eat 8 fruits/vegetables per day, improve fruit/vegetable variety    Comments Last A1C 7.6 in May 2022, no new A1C. BG: ~130-140. B: cereal (great grains or special K with 2% milk) S: coffee L: was having salad at lunch, but now will have whole wheat sandwich - banana or soup (1/2 can) D: tries to cook healthy: beef 1-2x/week, usually chicken or lean pork. She uses an airfryer with olive oil. Will have potatoes or sweet potatoes a couple of times per week, beans, green beans,  and vegetables from the garden. She likes to stick to the same vegetables, unless othe fruits and vegetables are on sale. Discussed heart healthy eating and diabetes friendly eating.      Intervention Plan   Intervention Prescribe, educate and counsel regarding individualized specific dietary modifications aiming towards targeted core components such as weight, hypertension, lipid management, diabetes, heart failure and other comorbidities.    Expected Outcomes Short Term Goal: Understand basic principles of dietary  content, such as calories, fat, sodium, cholesterol and nutrients.;Short Term Goal: A plan has been developed with personal nutrition goals set during dietitian appointment.;Long Term Goal: Adherence to prescribed nutrition plan.             Nutrition Discharge:   Education Questionnaire Score:  Knowledge Questionnaire Score - 09/06/21 0937       Knowledge Questionnaire Score   Pre Score 20/26             Goals reviewed with patient; copy given to patient.

## 2021-10-11 NOTE — Progress Notes (Signed)
Daily Session Note  Patient Details  Name: Elaine Wright MRN: [id number] Date of Birth: 01-Sep-1956 Referring Provider:   Flowsheet Row Cardiac Rehab from 09/06/2021 in [facility] Cardiac and Pulmonary Rehab  Referring Provider End, Harrell Gave MD       Encounter Date: 10/11/2021  Check In:  Session Check In - 10/11/21 0952       Check-In   Supervising physician immediately available to respond to emergencies See telemetry face sheet for immediately available ER MD    Location [facility]-Cardiac & Pulmonary Rehab    Staff Present Birdie Sons, MPA, RN;Joseph [place], Jaci Carrel, BS, ACSM CEP, Exercise Physiologist;Melissa Casas, RDN, LDN    Virtual Visit No    Medication changes reported     No    Fall or balance concerns reported    No    Warm-up and Cool-down Performed on first and last piece of equipment    Resistance Training Performed Yes    VAD Patient? No    PAD/SET Patient? No      Pain Assessment   Currently in Pain? No/denies                Social History   Tobacco Use  Smoking Status Former   Packs/day: 0.50   Years: 45.00   Pack years: 22.50   Types: Cigarettes   Quit date: 03/11/2021   Years since quitting: 0.5  Smokeless Tobacco Never    Goals Met:  Independence with exercise equipment Exercise tolerated well No report of concerns or symptoms today Strength training completed today  Goals Unmet:  Not Applicable  Comments:  Kabrea graduated today from  rehab with 11 sessions completed.  Details of the patient's exercise prescription and what She needs to do in order to continue the prescription and progress were discussed with patient.  Patient was given a copy of prescription and goals.  Patient verbalized understanding.  Elaine Wright plans to continue to exercise by going to [facility] and attending exercise classes.    Dr. Emily Filbert is Medical Director for [place].  Dr. Ottie Glazier is Medical Director  for [facility] Pulmonary Rehabilitation.

## 2021-10-16 ENCOUNTER — Telehealth: Payer: Self-pay | Admitting: Gastroenterology

## 2021-11-01 ENCOUNTER — Ambulatory Visit: Payer: Medicare Other | Admitting: Internal Medicine

## 2021-11-01 ENCOUNTER — Encounter: Payer: Self-pay | Admitting: Internal Medicine

## 2021-11-01 ENCOUNTER — Other Ambulatory Visit: Payer: Self-pay

## 2021-11-01 VITALS — BP 114/82 | HR 59 | Ht 61.0 in | Wt 148.0 lb

## 2021-11-01 DIAGNOSIS — I5032 Chronic diastolic (congestive) heart failure: Secondary | ICD-10-CM

## 2021-11-01 DIAGNOSIS — E1169 Type 2 diabetes mellitus with other specified complication: Secondary | ICD-10-CM

## 2021-11-01 DIAGNOSIS — E785 Hyperlipidemia, unspecified: Secondary | ICD-10-CM | POA: Diagnosis not present

## 2021-11-01 DIAGNOSIS — I2510 Atherosclerotic heart disease of native coronary artery without angina pectoris: Secondary | ICD-10-CM | POA: Diagnosis not present

## 2021-11-01 MED ORDER — ATORVASTATIN CALCIUM 40 MG PO TABS: 40.0000 mg | ORAL_TABLET | Freq: Every day | ORAL | 2 refills | Status: DC

## 2021-11-01 MED ORDER — CLOPIDOGREL BISULFATE 75 MG PO TABS: 75.0000 mg | ORAL_TABLET | Freq: Every day | ORAL | 2 refills | Status: DC

## 2021-11-01 NOTE — Progress Notes (Signed)
Follow-up Outpatient Visit Date: 11/01/2021  Primary Care Provider: [name], MD [address] [id number]  Chief Complaint: Follow-up coronary artery disease  HPI:  Elaine Wright is a 65 y.o. female with history of coronary artery disease with anterolateral STEMI in 03/2021 status post PCI to ostial/proximal LAD, chronic HFpEF, hyperlipidemia, type 2 diabetes mellitus, obstructive sleep apnea, and osteoarthritis, who presents for follow-up of coronary artery disease.  She was last seen in our office in August by Tarri Glenn, [place], at which time she was feeling fairly well.  She noted that she did not take part in cardiac rehab but was exercising regularly on her own.  No medication changes or additional testing were pursued.  Today, Elaine Wright reports that she is feeling quite well.  She has not had any chest pain.  Her breathing has improved on its own, prompting her to discontinue her inhalers.  She has not had any significant shortness of breath recently.  She notes mild dependent edema at times.  She denies palpitations and lightheadedness.  She is exercising at the gym 4 days a week without difficulty.  She stopped taking ticagrelor when her last refill ran out due to cost.  She was not having any side effects but felt like she was doing so well that she would not need the medication anymore.  --------------------------------------------------------------------------------------------------  Past Medical History:  Diagnosis Date   Arthritis    Diabetes mellitus without complication (HCC)    GERD (gastroesophageal reflux disease)    Sleep apnea    Past Surgical History:  Procedure Laterality Date   ABDOMINAL HYSTERECTOMY  2003   ANTERIOR AND POSTERIOR REPAIR N/A 04/14/2020   Procedure: ANTERIOR (CYSTOCELE) AND POSTERIOR REPAIR (RECTOCELE);  Surgeon: Schermerhorn, Gwen Her, MD;  Location: [facility];  Service: Gynecology;  Laterality: N/A;   CARDIAC CATHETERIZATION      CORONARY/GRAFT ACUTE MI REVASCULARIZATION N/A 03/24/2021   Procedure: Coronary/Graft Acute MI Revascularization;  Surgeon: Burnell Blanks, MD;  Location: [place] [facility];  Service: Cardiovascular;  Laterality: N/A;   ESOPHAGOGASTRODUODENOSCOPY (EGD) WITH PROPOFOL N/A 05/28/2021   Procedure: ESOPHAGOGASTRODUODENOSCOPY (EGD) WITH PROPOFOL;  Surgeon: Jonathon Bellows, MD;  Location: [facility]OSCOPY;  Service: Gastroenterology;  Laterality: N/A;   EYE SURGERY  2011   Cataract bil   LEFT HEART CATH AND CORONARY ANGIOGRAPHY N/A 03/24/2021   Procedure: LEFT HEART CATH AND CORONARY ANGIOGRAPHY;  Surgeon: Burnell Blanks, MD;  Location: [place] [facility];  Service: Cardiovascular;  Laterality: N/A;   PARTIAL KNEE ARTHROPLASTY  2009   right    Thumb joint     Tendon from arm to replace arthrititic thumb   TOTAL KNEE ARTHROPLASTY  12/23/2012   Procedure: TOTAL KNEE ARTHROPLASTY;  Surgeon: Ninetta Lights, MD;  Location: [place];  Service: Orthopedics;  Laterality: Left;   TRACHELECTOMY N/A 04/14/2020   Procedure: LAPAROSCOPIC TRACHELECTOMY;  Surgeon: Schermerhorn, Gwen Her, MD;  Location: [facility];  Service: Gynecology;  Laterality: N/A;    Current Meds  Medication Sig   alendronate (FOSAMAX) 70 MG tablet Take 70 mg by mouth every Friday.   aspirin 81 MG chewable tablet Chew 1 tablet (81 mg total) by mouth daily.   atorvastatin (LIPITOR) 80 MG tablet TAKE 1 TABLET(80 MG) BY MOUTH DAILY   azelastine (ASTELIN) 0.1 % nasal spray Place 1 spray into both nostrils daily as needed.   Carboxymethylcellulose Sodium (THERATEARS OP) Place 1 drop into both eyes daily as needed (when using contacts).  dextromethorphan-guaiFENesin (MUCINEX DM) 30-600 MG 12hr tablet Take 1 tablet by mouth 2 (two) times daily as needed for cough.   metFORMIN (GLUCOPHAGE-XR) 500 MG 24 hr tablet Take 500 mg by mouth 2 (two) times daily.   metoprolol succinate (TOPROL-XL) 25 MG 24 hr tablet TAKE 1 TABLET(25 MG) BY  MOUTH DAILY   metroNIDAZOLE (METROCREAM) 0.75 % cream Apply topically at bedtime. qhs to face for Rosacea   Multiple Vitamin (MULTI-VITAMIN) tablet Take 1 tablet by mouth daily.   omeprazole (PRILOSEC) 20 MG capsule Take 20 mg by mouth daily.    Allergies: Morphine and related  Social History   Tobacco Use   Smoking status: Former    Packs/day: 0.50    Years: 45.00    Pack years: 22.50    Types: Cigarettes    Quit date: 03/11/2021    Years since quitting: 0.6   Smokeless tobacco: Never  Vaping Use   Vaping Use: Former  Substance Use Topics   Alcohol use: Yes    Alcohol/week: 1.0 standard drink    Types: 1 Glasses of wine per week    Comment: occassional   Drug use: No    Family History  Problem Relation Age of Onset   Heart disease Mother    Diabetes Mother    Heart attack Father    Breast cancer Neg Hx     Review of Systems: A 12-system review of systems was performed and was negative except as noted in the HPI.  --------------------------------------------------------------------------------------------------  Physical Exam: BP 114/82 (BP Location: Left Arm, Patient Position: Sitting, Cuff Size: Normal)   Pulse (!) 59   Ht 5\' 1"  (1.549 m)   Wt 148 lb (67.1 kg)   SpO2 96%   BMI 27.96 kg/m   General:  NAD. Neck: No JVD or HJR. Lungs: Clear to auscultation bilaterally without wheezes or crackles. Heart: Regular rate and rhythm without murmurs, rubs, or gallops. Abdomen: Soft, nontender, nondistended. Extremities: No lower extremity edema.  EKG: Sinus bradycardia with low voltage and nonspecific ST/T changes.  No significant change from prior tracing on 05/12/2021.  Lab Results  Component Value Date   WBC 21.3 (H) 05/14/2021   HGB 12.6 05/14/2021   HCT 38.5 05/14/2021   MCV 87.1 05/14/2021   PLT 271 05/14/2021    Lab Results  Component Value Date   NA 140 05/14/2021   K 4.8 05/14/2021   CL 105 05/14/2021   CO2 26 05/14/2021   BUN 18 05/14/2021    CREATININE 0.65 05/14/2021   GLUCOSE 229 (H) 05/14/2021   ALT 26 05/13/2021    Lab Results  Component Value Date   CHOL 142 03/26/2021   HDL 62 03/26/2021   LDLCALC 68 03/26/2021   TRIG 61 03/26/2021   CHOLHDL 2.3 03/26/2021    --------------------------------------------------------------------------------------------------  ASSESSMENT AND PLAN: Coronary artery disease: No angina reported status post MI with PCI to ostial/proximal LAD in 03/2021.  Rationale behind dual antiplatelet therapy for at least 12 months from time of STEMI/PCI was discussed with Elaine Wright.  Due to cost of ticagrelor, we have agreed to start clopidogrel 75 mg daily to complete 12 months of DAPT.  Elaine Wright to remain on aspirin 81 mg daily.  Continue secondary prevention with metoprolol and atorvastatin, though we will have atorvastatin due to low LDL.  Chronic HFpEF: Elaine Wright appears euvolemic with NYHA class I symptoms.  Continue current dose of metoprolol.  No further intervention needed at this time.  Hyperlipidemia associated with type  2 diabetes mellitus: LDL on last check with Dr. Lovie Macadamia was 37 last week.  We have agreed to decrease atorvastatin to 40 mg daily.  Ongoing management of DM per Dr. Lovie Macadamia.  Follow-up: Return to clinic in 6 months.  Nelva Bush, MD 11/01/2021 10:23 AM

## 2021-11-01 NOTE — Patient Instructions (Signed)
Medication Instructions:   Your physician has recommended you make the following change in your medication:   DECREASE Atorvastatin 40 mg daily - You may cut your current tablet in half (for 40 mg total) until you fill new Rx  START Plavix 75 mg daily   *If you need a refill on your cardiac medications before your next appointment, please call your pharmacy*   Lab Work:  None ordered  Testing/Procedures:  None ordered   Follow-Up: At [facility], you and your health needs are our priority.  As part of our continuing mission to provide you with exceptional heart care, we have created designated Provider Care Teams.  These Care Teams include your primary Cardiologist (physician) and Advanced Practice Providers (APPs -  Physician Assistants and Nurse Practitioners) who all work together to provide you with the care you need, when you need it.  We recommend signing up for the patient portal called "MyChart".  Sign up information is provided on this After Visit Summary.  MyChart is used to connect with patients for Virtual Visits (Telemedicine).  Patients are able to view lab/test results, encounter notes, upcoming appointments, etc.  Non-urgent messages can be sent to your provider as well.   To learn more about what you can do with MyChart, go to [email or web address].    Your next appointment:   6 month(s)  The format for your next appointment:   In Person  Provider:   You may see Nelva Bush, MD or one of the following Advanced Practice Providers on your designated Care Team:   Murray Hodgkins, NP Christell Faith, PA-C Cadence Kathlen Mody, [place]

## 2022-01-15 ENCOUNTER — Other Ambulatory Visit: Payer: Self-pay

## 2022-01-15 DIAGNOSIS — I25118 Atherosclerotic heart disease of native coronary artery with other forms of angina pectoris: Secondary | ICD-10-CM

## 2022-01-15 MED ORDER — METOPROLOL SUCCINATE ER 25 MG PO TB24: ORAL_TABLET | ORAL | 0 refills | Status: DC

## 2022-01-25 ENCOUNTER — Encounter: Payer: Self-pay | Admitting: Nurse Practitioner

## 2022-01-25 ENCOUNTER — Other Ambulatory Visit: Payer: Self-pay

## 2022-01-25 ENCOUNTER — Ambulatory Visit: Payer: Medicare Other | Admitting: Nurse Practitioner

## 2022-01-25 VITALS — BP 130/84 | HR 75 | Ht 61.0 in | Wt 149.0 lb

## 2022-01-25 DIAGNOSIS — E785 Hyperlipidemia, unspecified: Secondary | ICD-10-CM

## 2022-01-25 DIAGNOSIS — J449 Chronic obstructive pulmonary disease, unspecified: Secondary | ICD-10-CM

## 2022-01-25 DIAGNOSIS — I2510 Atherosclerotic heart disease of native coronary artery without angina pectoris: Secondary | ICD-10-CM

## 2022-01-25 DIAGNOSIS — I5032 Chronic diastolic (congestive) heart failure: Secondary | ICD-10-CM | POA: Diagnosis not present

## 2022-01-25 DIAGNOSIS — Z87891 Personal history of nicotine dependence: Secondary | ICD-10-CM

## 2022-01-25 DIAGNOSIS — G4733 Obstructive sleep apnea (adult) (pediatric): Secondary | ICD-10-CM

## 2022-01-25 DIAGNOSIS — R011 Cardiac murmur, unspecified: Secondary | ICD-10-CM

## 2022-01-25 NOTE — Progress Notes (Signed)
Office Visit    Patient Name: Elaine Wright Date of Encounter: 01/25/2022  Primary Care Provider:  [name], MD Primary Cardiologist:  Nelva Bush, MD  Chief Complaint    66 year old female with a history of CAD s/p STEMI, DES -pLAD in 05/2830, chronic diastolic heart failure, hyperlipidemia, OSA, COPD, type 2 diabetes, GERD, hiatal hernia, tobacco use, and OA who presents for follow-up related to CAD, chronic diastolic heart failure, and new murmur.   Past Medical History    Past Medical History:  Diagnosis Date   Arthritis    Diabetes mellitus without complication ([place])    GERD (gastroesophageal reflux disease)    Sleep apnea    Past Surgical History:  Procedure Laterality Date   ABDOMINAL HYSTERECTOMY  2003   ANTERIOR AND POSTERIOR REPAIR N/A 04/14/2020   Procedure: ANTERIOR (CYSTOCELE) AND POSTERIOR REPAIR (RECTOCELE);  Surgeon: Schermerhorn, Gwen Her, MD;  Location: [facility];  Service: Gynecology;  Laterality: N/A;   CARDIAC CATHETERIZATION     CORONARY/GRAFT ACUTE MI REVASCULARIZATION N/A 03/24/2021   Procedure: Coronary/Graft Acute MI Revascularization;  Surgeon: Burnell Blanks, MD;  Location: [place] [facility];  Service: Cardiovascular;  Laterality: N/A;   ESOPHAGOGASTRODUODENOSCOPY (EGD) WITH PROPOFOL N/A 05/28/2021   Procedure: ESOPHAGOGASTRODUODENOSCOPY (EGD) WITH PROPOFOL;  Surgeon: Jonathon Bellows, MD;  Location: [facility] ENDOSCOPY;  Service: Gastroenterology;  Laterality: N/A;   EYE SURGERY  2011   Cataract bil   LEFT HEART CATH AND CORONARY ANGIOGRAPHY N/A 03/24/2021   Procedure: LEFT HEART CATH AND CORONARY ANGIOGRAPHY;  Surgeon: Burnell Blanks, MD;  Location: [place] [facility];  Service: Cardiovascular;  Laterality: N/A;   PARTIAL KNEE ARTHROPLASTY  2009   right    Thumb joint     Tendon from arm to replace arthrititic thumb   TOTAL KNEE ARTHROPLASTY  12/23/2012   Procedure: TOTAL KNEE ARTHROPLASTY;  Surgeon: Ninetta Lights,  MD;  Location: [place];  Service: Orthopedics;  Laterality: Left;   TRACHELECTOMY N/A 04/14/2020   Procedure: LAPAROSCOPIC TRACHELECTOMY;  Surgeon: Schermerhorn, Gwen Her, MD;  Location: [facility];  Service: Gynecology;  Laterality: N/A;    Allergies  Allergies  Allergen Reactions   Morphine And Related Other (See Comments)    Confusion, hallucinations    History of Present Illness   66 year old female with the above past medical history including CAD s/p STEMI, DES -pLAD in 04/1760, chronic diastolic heart failure, hyperlipidemia, OSA, COPD, type 2 diabetes, GERD, hiatal hernia, former tobacco use, and OA who presents for follow-up related to CAD and chronic diastolic heart failure.   She was admitted to the hospital in April 2022 with chest tightness, exertional dyspnea in the setting of STEMI. She underwent emergent cardiac catheterization which showed o-pLAD 80%-0% s/p DES. Echocardiogram at the time showed an EF of 60-65%, normal LV function, no RWMA. PFTs in 06/2021 in the setting of ongoing dyspnea showed possible mild asthma. Brilinta was switched to Plavix due to cost, but also she noted an improvement in her breathing with this change. She was last seen in the office on 11/01/2021 and was doing well from a cardiac standpoint. She denied symptoms concerning for angina. She saw her PCP in early February 2023 and was found to have new systolic murmur on exam. Home screening for PVD was positive per home health nurse. Additionally, she was recently referred to GI for a large hiatal hernia.   She presents today for follow-up. Since her last visit she has done well from a  cardiac standpoint. She denies symptoms concerning for angina. She does have chronic mild non-pitting dependent lower extremity edema and stable chronic mild dyspnea. She denies claudication. Overall, she reports feeling well and denies and specific concerns or complaints today.   Home Medications    Current Outpatient  Medications  Medication Sig Dispense Refill   alendronate (FOSAMAX) 70 MG tablet Take 70 mg by mouth every Friday.     aspirin 81 MG chewable tablet Chew 1 tablet (81 mg total) by mouth daily.     atorvastatin (LIPITOR) 20 MG tablet Take 20 mg by mouth daily.     azelastine (ASTELIN) 0.1 % nasal spray Place 1 spray into both nostrils daily as needed.     Carboxymethylcellulose Sodium (THERATEARS OP) Place 1 drop into both eyes daily as needed (when using contacts).     clopidogrel (PLAVIX) 75 MG tablet Take 1 tablet (75 mg total) by mouth daily. 90 tablet 2   dextromethorphan-guaiFENesin (MUCINEX DM) 30-600 MG 12hr tablet Take 1 tablet by mouth 2 (two) times daily as needed for cough.     metFORMIN (GLUCOPHAGE-XR) 500 MG 24 hr tablet Take 500 mg by mouth 2 (two) times daily.     metoprolol succinate (TOPROL-XL) 25 MG 24 hr tablet TAKE 1 TABLET(25 MG) BY MOUTH DAILY 90 tablet 0   metroNIDAZOLE (METROCREAM) 0.75 % cream Apply topically at bedtime. qhs to face for Rosacea 45 g 6   Multiple Vitamin (MULTI-VITAMIN) tablet Take 1 tablet by mouth daily.     nitroGLYCERIN (NITROSTAT) 0.4 MG SL tablet Place 0.4 mg under the tongue every 5 (five) minutes as needed for chest pain.     omeprazole (PRILOSEC) 20 MG capsule Take 20 mg by mouth daily.     No current facility-administered medications for this visit.     Review of Systems    She denies chest pain, palpitations, pnd, orthopnea, n, v, dizziness, syncope, weight gain, or early satiety. All other systems reviewed and are otherwise negative except as noted above.   Physical Exam    VS:  BP 130/84 (BP Location: Left Arm, Patient Position: Sitting, Cuff Size: Normal)    Pulse 75    Ht 5\' 1"  (1.549 m)    Wt 149 lb (67.6 kg)    SpO2 96%    BMI 28.15 kg/m  GEN: Well nourished, well developed, in no acute distress. HEENT: normal. Neck: Supple, no JVD, carotid bruits, or masses. Cardiac: RRR, 2/6 systolic murmur, no rubs, or gallops. No clubbing,  cyanosis, mild  dependent bilateral lower extremity edema.  Radials/DP/PT 2+ and equal bilaterally.  Respiratory:  Respirations regular and unlabored, clear to auscultation bilaterally. GI: Soft, nontender, nondistended, BS + x 4. MS: no deformity or atrophy. Skin: warm and dry, no rash. Neuro:  Strength and sensation are intact. Psych: Normal affect.  Accessory Clinical Findings    ECG personally reviewed by me today - Normal sinus rhythm, 75 bpm, non-specific ST changes - no acute changes.  Lab Results  Component Value Date   WBC 21.3 (H) 05/14/2021   HGB 12.6 05/14/2021   HCT 38.5 05/14/2021   MCV 87.1 05/14/2021   PLT 271 05/14/2021   Lab Results  Component Value Date   CREATININE 0.65 05/14/2021   BUN 18 05/14/2021   NA 140 05/14/2021   K 4.8 05/14/2021   CL 105 05/14/2021   CO2 26 05/14/2021   Lab Results  Component Value Date   ALT 26 05/13/2021   AST 26 05/13/2021  ALKPHOS 70 05/13/2021   BILITOT 0.5 05/13/2021   Lab Results  Component Value Date   CHOL 142 03/26/2021   HDL 62 03/26/2021   LDLCALC 68 03/26/2021   TRIG 61 03/26/2021   CHOLHDL 2.3 03/26/2021    Lab Results  Component Value Date   HGBA1C 7.6 (H) 05/13/2021    Assessment & Plan    1. CAD: S/p STEMI, DES-o-p LAD in 03/2021. Echo at the time showed EF of 60-65%, normal LV function, no RWMA. Stable with no anginal symptoms. No indication for ischemic evaluation. DAPT recommended x1 year. Continue ASA, Plavix, metoprolol, and Lipitor.   2. Chronic diastolic heart failure/new murmur: Most recent echo as above. She does have chronic mild dependent bilateral lower extremity edema, and mild chronic dyspnea (likely in the setting of COPD). She appears euvolemic and well compensated on exam today. She does have a 2/6 systolic murmur on exam, only recently documented at most recent visit with PCP. Asymptomatic. Repeat echo to assess for structural changes. No indication for diuretic at this time.  Continue Metoprolol.   3. Hyperlipidemia: LDL was 37 in 10/2021. She states she was told to decrease her statin per PCP. Monitored and managed by PCP. She is scheduled for repeat lipids per PCP. Given history of CAD, recommend resumption of high-intensity statin with Lipitor 40 mg.   4. OSA: Adherent to CPAP.   5. Former tobacco use/COPD: She has quit smoking. Stable chronic mild dyspnea, follows with pulmonology.   6. Home health screening concerning for PVD:  She states she had a home health screening which she was told was concerning for PVD. She denies claudication, no discoloration, 2+ bilateral DP pulses. She does have mild chronic bilateral dependent edema, stable. No indication for further testing at this time.   7. Disposition: Follow-up as scheduled with Dr. Saunders Revel in 3 months.   Lenna Sciara, NP 01/25/2022, 3:32 PM

## 2022-01-25 NOTE — Patient Instructions (Signed)
Medication Instructions:   Your physician recommends that you continue on your current medications as directed. Please refer to the Current Medication list given to you today.  *If you need a refill on your cardiac medications before your next appointment, please call your pharmacy*   Lab Work:  None ordered  Testing/Procedures:  Your physician has requested that you have an echocardiogram. Echocardiography is a painless test that uses sound waves to create images of your heart. It provides your doctor with information about the size and shape of your heart and how well your hearts chambers and valves are working. This procedure takes approximately one hour. There are no restrictions for this procedure.   Follow-Up: At [facility], you and your health needs are our priority.  As part of our continuing mission to provide you with exceptional heart care, we have created designated Provider Care Teams.  These Care Teams include your primary Cardiologist (physician) and Advanced Practice Providers (APPs -  Physician Assistants and Nurse Practitioners) who all work together to provide you with the care you need, when you need it.  We recommend signing up for the patient portal called "MyChart".  Sign up information is provided on this After Visit Summary.  MyChart is used to connect with patients for Virtual Visits (Telemedicine).  Patients are able to view lab/test results, encounter notes, upcoming appointments, etc.  Non-urgent messages can be sent to your provider as well.   To learn more about what you can do with MyChart, go to [email or web address].    Your next appointment:    Keep follow up as scheduled with Dr. Saunders Revel

## 2022-02-11 ENCOUNTER — Ambulatory Visit: Payer: Medicare Other | Admitting: Dermatology

## 2022-02-11 ENCOUNTER — Other Ambulatory Visit: Payer: Self-pay

## 2022-02-11 DIAGNOSIS — T148XXA Other injury of unspecified body region, initial encounter: Secondary | ICD-10-CM | POA: Diagnosis not present

## 2022-02-11 DIAGNOSIS — L719 Rosacea, unspecified: Secondary | ICD-10-CM

## 2022-02-11 DIAGNOSIS — Z1283 Encounter for screening for malignant neoplasm of skin: Secondary | ICD-10-CM

## 2022-02-11 DIAGNOSIS — D2262 Melanocytic nevi of left upper limb, including shoulder: Secondary | ICD-10-CM

## 2022-02-11 DIAGNOSIS — L578 Other skin changes due to chronic exposure to nonionizing radiation: Secondary | ICD-10-CM

## 2022-02-11 DIAGNOSIS — L820 Inflamed seborrheic keratosis: Secondary | ICD-10-CM

## 2022-02-11 DIAGNOSIS — D229 Melanocytic nevi, unspecified: Secondary | ICD-10-CM

## 2022-02-11 DIAGNOSIS — L814 Other melanin hyperpigmentation: Secondary | ICD-10-CM

## 2022-02-11 DIAGNOSIS — L821 Other seborrheic keratosis: Secondary | ICD-10-CM

## 2022-02-11 NOTE — Patient Instructions (Addendum)

## 2022-02-11 NOTE — Progress Notes (Signed)
Follow-Up Visit   Subjective  Elaine Wright is a 66 y.o. female who presents for the following: Total body skin exam and Rosacea (Face, 1 yr f/u, Metronidazole 0.75% cr qd).  The patient presents for Total-Body Skin Exam (TBSE) for skin cancer screening and mole check.  The patient has spots, moles and lesions to be evaluated, some may be new or changing and the patient has concerns that these could be cancer.   The following portions of the chart were reviewed this encounter and updated as appropriate:       Review of Systems:  No other skin or systemic complaints except as noted in HPI or Assessment and Plan.  Objective  Well appearing patient in no apparent distress; mood and affect are within normal limits.  A full examination was performed including scalp, head, eyes, ears, nose, lips, neck, chest, axillae, abdomen, back, buttocks, bilateral upper extremities, bilateral lower extremities, hands, feet, fingers, toes, fingernails, and toenails. All findings within normal limits unless otherwise noted below.  L post upper arm 1.0cm speckled brown macule with irregular border, no changes when compared to photo  arms, legs Multiple excoriations bil arms, legs  face Erythema malar cheeks with telangiectasias  crown scalp x 1 Residual keratotic waxy papule with erythema    Assessment & Plan   Lentigines - Scattered tan macules - Due to sun exposure - Benign-appearing, observe - Recommend daily broad spectrum sunscreen SPF 30+ to sun-exposed areas, reapply every 2 hours as needed. - Call for any changes - back, legs, face  Seborrheic Keratoses - Stuck-on, waxy, tan-brown papules and/or plaques  - Benign-appearing - Discussed benign etiology and prognosis. - Observe - Call for any changes - legs, arms  Melanocytic Nevi - Tan-brown and/or pink-flesh-colored symmetric macules and papules - Benign appearing on exam today - Observation - Call clinic for new or  changing moles - Recommend daily use of broad spectrum spf 30+ sunscreen to sun-exposed areas.   Actinic Damage - Chronic condition, secondary to cumulative UV/sun exposure - diffuse scaly erythematous macules with underlying dyspigmentation - Recommend daily broad spectrum sunscreen SPF 30+ to sun-exposed areas, reapply every 2 hours as needed.  - Staying in the shade or wearing long sleeves, sun glasses (UVA+UVB protection) and wide brim hats (4-inch brim around the entire circumference of the hat) are also recommended for sun protection.  - Call for new or changing lesions. - chest  Skin cancer screening performed today.  Nevus spilus L post upper arm  Stable. Present since childhood Bx proven benign 02/04/2011 Observe for changes   Excoriation arms, legs  From working outside clearing brush Healing Benign, observe.    Rosacea face  Chronic condition with duration or expected duration over one year. Currently well-controlled.   Rosacea is a chronic progressive skin condition usually affecting the face of adults, causing redness and/or acne bumps. It is treatable but not curable. It sometimes affects the eyes (ocular rosacea) as well. It may respond to topical and/or systemic medication and can flare with stress, sun exposure, alcohol, exercise and some foods.  Daily application of broad spectrum spf 30+ sunscreen to face is recommended to reduce flares.  Cont Metronidazole 0.75% cr qhs  Inflamed seborrheic keratosis crown scalp x 1  2nd treatment today Recheck on f/up  Destruction of lesion - crown scalp x 1  Destruction method: cryotherapy   Informed consent: discussed and consent obtained   Lesion destroyed using liquid nitrogen: Yes   Region frozen until  ice ball extended beyond lesion: Yes   Outcome: patient tolerated procedure well with no complications   Post-procedure details: wound care instructions given   Additional details:  Prior to procedure,  discussed risks of blister formation, small wound, skin dyspigmentation, or rare scar following cryotherapy. Recommend Vaseline ointment to treated areas while healing.    Return in about 1 year (around 02/11/2023) for TBSE, Rosacea f/u.  I, Othelia Pulling, RMA, am acting as scribe for Brendolyn Patty, MD .  Documentation: I have reviewed the above documentation for accuracy and completeness, and I agree with the above.  Brendolyn Patty MD

## 2022-02-20 ENCOUNTER — Ambulatory Visit (INDEPENDENT_AMBULATORY_CARE_PROVIDER_SITE_OTHER): Payer: Medicare Other

## 2022-02-20 ENCOUNTER — Other Ambulatory Visit: Payer: Self-pay

## 2022-02-20 DIAGNOSIS — R011 Cardiac murmur, unspecified: Secondary | ICD-10-CM

## 2022-02-21 LAB — ECHOCARDIOGRAM COMPLETE
AR max vel: 3.12 cm2
AV Area VTI: 3.03 cm2
AV Area mean vel: 2.92 cm2
AV Mean grad: 5 mmHg
AV Peak grad: 10.2 mmHg
Ao pk vel: 1.6 m/s
Area-P 1/2: 2.87 cm2
Calc EF: 73.3 %
S' Lateral: 2.2 cm
Single Plane A2C EF: 74.8 %
Single Plane A4C EF: 71.2 %

## 2022-02-28 ENCOUNTER — Other Ambulatory Visit: Payer: Self-pay | Admitting: *Deleted

## 2022-03-12 ENCOUNTER — Ambulatory Visit
Admission: RE | Admit: 2022-03-12 | Discharge: 2022-03-12 | Disposition: A | Discharge status: Home / Self Care | Payer: Medicare Other | Source: Ambulatory Visit | Attending: Acute Care | Admitting: Acute Care

## 2022-03-12 ENCOUNTER — Other Ambulatory Visit: Payer: Self-pay

## 2022-03-12 DIAGNOSIS — Z87891 Personal history of nicotine dependence: Secondary | ICD-10-CM | POA: Insufficient documentation

## 2022-03-12 DIAGNOSIS — J439 Emphysema, unspecified: Secondary | ICD-10-CM | POA: Diagnosis not present

## 2022-03-12 DIAGNOSIS — I700 Atherosclerosis of aorta: Secondary | ICD-10-CM | POA: Insufficient documentation

## 2022-03-12 DIAGNOSIS — Z122 Encounter for screening for malignant neoplasm of respiratory organs: Secondary | ICD-10-CM | POA: Diagnosis not present

## 2022-03-14 ENCOUNTER — Other Ambulatory Visit: Payer: Self-pay

## 2022-03-14 ENCOUNTER — Telehealth: Payer: Self-pay | Admitting: Acute Care

## 2022-03-14 DIAGNOSIS — Z87891 Personal history of nicotine dependence: Secondary | ICD-10-CM

## 2022-03-14 NOTE — Telephone Encounter (Signed)
Contacted patient by phone to review results of LDCT.  Atherosclerosis and emphysema as previous.  Patient is followed by cardiology. New area of ground glass.  Patient states she has had recent bronchitis and allergy flare up with cough.  Has seen 'allergy doctor' who also reviewed CT results.  Patient states cough is 'clearing up'.  CT results are not suspicious for lung cancer.  Patient acknowledged understanding and had no further questions.  Will repeat CT in one year. Results to PCP and new order placed ?

## 2022-03-18 ENCOUNTER — Other Ambulatory Visit: Payer: Self-pay | Admitting: Internal Medicine

## 2022-03-18 DIAGNOSIS — I25118 Atherosclerotic heart disease of native coronary artery with other forms of angina pectoris: Secondary | ICD-10-CM

## 2022-03-20 ENCOUNTER — Telehealth: Payer: Self-pay

## 2022-03-20 NOTE — Telephone Encounter (Signed)
? ?  Pre-operative Risk Assessment  ?  ?Patient Name: Elaine Wright  ?DOB: Apr 22, 1956 ?MRN: [id number]  ? ?  ? ?Request for Surgical Clearance   ? ?Procedure:   ESOPHAGOGASTRODUODENOSCOPY (EGD) ? ?Date of Surgery:  Clearance 06/11/22                              ?   ?Surgeon:  DR Lesly Rubenstein ?Surgeon's Group or Practice Name:  [facility] ?Phone number:  [phone] ?Fax number:  [phone] ?  ?Type of Clearance Requested:   ?Hold Clopidogrel (Plavix) 5 DAYS,  ?Prasugrel (Effient) 5 DAYS,  ?Ticagrelor (Brilinta) 3 DAYS,  ?Warfarin (Coumadin) 5 DAYS, and  ?Aggrenox 7DAYS ?  ?Type of Anesthesia:  General  ?  ?Additional requests/questions:   ? ?

## 2022-03-20 NOTE — Telephone Encounter (Signed)
? ?  Patient Name: Elaine Wright  ?DOB: 1956-05-16 ?MRN: [id number] ? ?Primary Cardiologist: Nelva Bush, MD ? ?Chart reviewed as part of pre-operative protocol coverage. The patient has an upcoming visit scheduled with Dr. Saunders Revel on 05/02/2022 at which time clearance can be addressed in case there are any issues that would impact surgical recommendations. ? ?EGD is scheduled for 06/11/2022 as below I added preop FYI to appointment note so that provider is aware to address at the time of outpatient visit. Per office protocol the cardiology provider should for their finalize clearance decision and recommendations regarding antiplatelet therapy to requesting party as below.   ? ?I will route this message as FYI to requesting party and remove this message from the preop box as separate preop Input not needed at this time.  Please call with any questions. ? ?Lenna Sciara, NP ?03/20/2022, 2:47 PM ? ? ?

## 2022-03-21 ENCOUNTER — Telehealth: Payer: Self-pay | Admitting: Internal Medicine

## 2022-03-21 NOTE — Telephone Encounter (Signed)
Patient returning call.

## 2022-03-21 NOTE — Telephone Encounter (Signed)
Pt c/o medication issue: ? ?1. Name of Medication: clopidogrel (PLAVIX) 75 MG tablet and aspirin 81 MG chewable tablet ? ?2. How are you currently taking this medication (dosage and times per day)? Patient took both this morning ? ?3. Are you having a reaction (difficulty breathing--STAT)?  ? ?4. What is your medication issue?  Patient had a severe nosebleed this morning. She went to the Urgent Care and had it packed. The Urgent Care told her to stop taking the above medications but she wanted to check with D. End before she stopped  ?

## 2022-03-21 NOTE — Telephone Encounter (Signed)
Called and spoke with pt. Notified of Dr. Darnelle Bos recc below.  ?Pt voiced understanding.  ?Pt will stop taking clopidogrel and will continue aspirin 81 mg daily, unless she has recurrent severe bleeding.  ?Pt will let us know of any recurrent issues.  ? ?Pt has no further questions at this time.  ?

## 2022-03-21 NOTE — Telephone Encounter (Signed)
Pt returned call. Pt has been on DAPT for ~1 year s/p STEMI, DES -pLAD in 03/2021.  ? ?Pt states this is first occurrence of nose bleed since being on both ASA and Plavix.  ?Nose bleed began at 6 AM this morning, bled for ~2 hours.  ?Pt went to urgent care and had right nostril packed with gauze.  ?Bleeding has subsided. Pt denies any further s/s of bleeding.  ?Pt had CBC 02/21/22. Also to note pt had microscopic hematuria on UA 02/26/22.  ?Pt denies any gross hematuria.  ? ?Pt is scheduled to return to urgent care this Saturday to have gauze removed.  ?Pt denies any current issues at this time, is out shopping.  ?Pt understands to let us know of any further signs of bleeding.  ? ?Pt currently scheduled for 6 months follow up 05/02/22.  ?Notified pt will make Dr. Saunders Revel aware and call with further recc.  ?

## 2022-03-21 NOTE — Telephone Encounter (Signed)
Attempted to call pt, no answer. Lmtcb.  ? ?In the meantime will make Dr. Saunders Revel aware. ? ?Per message below: Patient had a severe nosebleed this morning. She went to the Urgent Care and had it packed. The Urgent Care told her to stop taking the above medications but she wanted to check with D. End before she stopped  ? ?Pt took both Plavix and ASA 81 mg this morning.  ?

## 2022-03-21 NOTE — Telephone Encounter (Signed)
As Elaine Wright is almost 12 months out from STEMI/PCI (03/24/2021), I recommend that she stop taking clopidogrel but continue with aspirin 81 mg daily unless she has recurrent severe bleeding. ? ?Nelva Bush, MD ?[facility] ? ?

## 2022-04-03 ENCOUNTER — Ambulatory Visit: Payer: Medicare Other | Admitting: Podiatry

## 2022-04-03 ENCOUNTER — Encounter: Payer: Self-pay | Admitting: Podiatry

## 2022-04-03 ENCOUNTER — Ambulatory Visit (INDEPENDENT_AMBULATORY_CARE_PROVIDER_SITE_OTHER): Payer: Medicare Other

## 2022-04-03 DIAGNOSIS — M779 Enthesopathy, unspecified: Secondary | ICD-10-CM

## 2022-04-03 DIAGNOSIS — M778 Other enthesopathies, not elsewhere classified: Secondary | ICD-10-CM | POA: Diagnosis not present

## 2022-04-03 MED ORDER — TRIAMCINOLONE ACETONIDE 10 MG/ML IJ SUSP
10.0000 mg | Freq: Once | INTRAMUSCULAR | Status: AC
  Administered 2022-04-03: 10 mg

## 2022-04-03 NOTE — Progress Notes (Signed)
Subjective:  ? ?Patient ID: Elaine Wright, female   DOB: 66 y.o.   MRN: [id number]  ? ?HPI ?Patient states she is developed a lot of pain on top of her right foot and she has had this same pain for a long time off and on but it is gotten worse recently.  States its been swollen and she does not remember injury and patient used 2 smoke a half a pack per day tries to be active and has currently stopped smoking last year ? ? ?Review of Systems  ?All other systems reviewed and are negative. ? ? ?   ?Objective:  ?Physical Exam ?Vitals and nursing note reviewed.  ?Constitutional:   ?   Appearance: She is well-developed.  ?Pulmonary:  ?   Effort: Pulmonary effort is normal.  ?Musculoskeletal:     ?   General: Normal range of motion.  ?Skin: ?   General: Skin is warm.  ?Neurological:  ?   Mental Status: She is alert.  ?  ?Neurovascular status was found to be intact muscle strength was found to be adequate range of motion adequate.  Patient is found to have inflammation and pain of the dorsal right foot around the extensor tendon complex with fluid buildup around this area with patient found to have good digital perfusion well oriented x3 ? ?   ?Assessment:  ?Dorsal mid foot arthritis with inflammation fluid buildup around the area    ?Plan:  ?H&P x-rays reviewed condition discussed.  At this point I did sterile prep and injected the dorsal tendon complex 3 mg Kenalog 5 mg Xylocaine and I advised this patient on wider shoe gear topical medicines.  Patient will be seen back to recheck may eventually require other treatment and I did explain the nature of long-term arthritis ? ?X-rays indicate spurring of the mid tarsal joint right narrowing of the joint surfaces ?   ? ? ?

## 2022-05-01 ENCOUNTER — Encounter: Payer: Self-pay | Admitting: Internal Medicine

## 2022-05-01 NOTE — Progress Notes (Signed)
Follow-up Outpatient Visit Date: 05/02/2022  Primary Care Provider: [name], MD [address] [address]  Chief Complaint: Follow-up coronary artery disease  HPI:  Ms. Ratledge is a 66 y.o. female with history of coronary artery disease with anterolateral STEMI in 03/2021 status post PCI to ostial/proximal LAD, chronic HFpEF, hyperlipidemia, type 2 diabetes mellitus, obstructive sleep apnea, and osteoarthritis, who presents for follow-up of coronary artery disease as well as preoperative cardiovascular risk assessment in anticipation of EGD.  She was last seen in our office in February by Diona Browner, NP, at which time she was doing well without chest pain.  Chronic lower extremity edema and mild dyspnea were stable.  Subsequent echo in 02/2022 showed LVEF of 60-65% with grade 1 diastolic dysfunction, normal RV size and function, and no significant valvular abnormality.  She recently noted dysphagia and has therefore been referred for EGD.  She also contacted our office last month with concerns about severe epistaxis.  As she was almost 12 months out from her STEMI/PCI, we recommended discontinuation of clopidogrel.  Today, Ms. Stanaland reports that she is feeling fairly well from a heart standpoint, denying chest pain and shortness of breath.  She has not had any further epistaxis since clopidogrel was discontinued.  She notes occasional mild dependent edema that has been stable.  She was referred for EGD due to dysphagia with certain foods, particularly lettuce.  However, this is improving and she now wonders if she even needs to move forward with the procedure.  She continues to exercise regularly without any limitations.  She is tolerating her medications well.  --------------------------------------------------------------------------------------------------  Past Medical History:  Diagnosis Date   Arthritis    Coronary artery disease 03/24/2021   Anterior STEMI s/p PCI to  ostial/proxiimal LAD   Diabetes mellitus without complication (HCC)    GERD (gastroesophageal reflux disease)    Sleep apnea    Past Surgical History:  Procedure Laterality Date   ABDOMINAL HYSTERECTOMY  2003   ANTERIOR AND POSTERIOR REPAIR N/A 04/14/2020   Procedure: ANTERIOR (CYSTOCELE) AND POSTERIOR REPAIR (RECTOCELE);  Surgeon: Schermerhorn, Gwen Her, MD;  Location: [facility];  Service: Gynecology;  Laterality: N/A;   CARDIAC CATHETERIZATION     CORONARY/GRAFT ACUTE MI REVASCULARIZATION N/A 03/24/2021   Procedure: Coronary/Graft Acute MI Revascularization;  Surgeon: Burnell Blanks, MD;  Location: [place] [facility];  Service: Cardiovascular;  Laterality: N/A;   ESOPHAGOGASTRODUODENOSCOPY (EGD) WITH PROPOFOL N/A 05/28/2021   Procedure: ESOPHAGOGASTRODUODENOSCOPY (EGD) WITH PROPOFOL;  Surgeon: Jonathon Bellows, MD;  Location: [facility];  Service: Gastroenterology;  Laterality: N/A;   EYE SURGERY  2011   Cataract bil   LEFT HEART CATH AND CORONARY ANGIOGRAPHY N/A 03/24/2021   Procedure: LEFT HEART CATH AND CORONARY ANGIOGRAPHY;  Surgeon: Burnell Blanks, MD;  Location: [place] [facility];  Service: Cardiovascular;  Laterality: N/A;   PARTIAL KNEE ARTHROPLASTY  2009   right    Thumb joint     Tendon from arm to replace arthrititic thumb   TOTAL KNEE ARTHROPLASTY  12/23/2012   Procedure: TOTAL KNEE ARTHROPLASTY;  Surgeon: Ninetta Lights, MD;  Location: [place];  Service: Orthopedics;  Laterality: Left;   TRACHELECTOMY N/A 04/14/2020   Procedure: LAPAROSCOPIC TRACHELECTOMY;  Surgeon: Schermerhorn, Gwen Her, MD;  Location: [facility] OR[facility];  Service: Gynecology;  Laterality: N/A;     Recent CV Pertinent Labs: Lab Results  Component Value Date   CHOL 142 03/26/2021   HDL 62 03/26/2021   LDLCALC 68  03/26/2021   TRIG 61 03/26/2021   CHOLHDL 2.3 03/26/2021   INR 1.0 03/24/2021   BNP 12.3 05/12/2021   K 4.8 05/14/2021   MG 2.0 03/27/2021   BUN 18 05/14/2021   BUN 15  04/03/2021   CREATININE 0.65 05/14/2021    Past medical and surgical history were reviewed and updated in EPIC.  Current Meds  Medication Sig   albuterol (VENTOLIN HFA) 108 (90 Base) MCG/ACT inhaler Inhale 2 puffs into the lungs every 4 (four) hours as needed.   aspirin 81 MG chewable tablet Chew 1 tablet (81 mg total) by mouth daily.   atorvastatin (LIPITOR) 20 MG tablet Take 20 mg by mouth daily.   azelastine (ASTELIN) 0.1 % nasal spray Place 1 spray into both nostrils daily as needed.   Carboxymethylcellulose Sodium (THERATEARS OP) Place 1 drop into both eyes daily as needed (when using contacts).   loratadine (CLARITIN) 10 MG tablet Take 10 mg by mouth daily.   metFORMIN (GLUCOPHAGE-XR) 500 MG 24 hr tablet Take 500 mg by mouth 2 (two) times daily.   metoprolol succinate (TOPROL-XL) 25 MG 24 hr tablet TAKE 1 TABLET BY MOUTH DAILY   Multiple Vitamin (MULTI-VITAMIN) tablet Take 1 tablet by mouth daily.   nitroGLYCERIN (NITROSTAT) 0.4 MG SL tablet Place 0.4 mg under the tongue every 5 (five) minutes as needed for chest pain.   omeprazole (PRILOSEC) 20 MG capsule Take 20 mg by mouth daily.   TRELEGY ELLIPTA 200-62.5-25 MCG/ACT AEPB Inhale 1 puff into the lungs daily as needed.    Allergies: Morphine and related  Social History   Tobacco Use   Smoking status: Former    Packs/day: 0.50    Years: 45.00    Pack years: 22.50    Types: Cigarettes    Quit date: 03/11/2021    Years since quitting: 1.1   Smokeless tobacco: Never  Vaping Use   Vaping Use: Former  Substance Use Topics   Alcohol use: Yes    Comment: occassional glass of wine some afternoons   Drug use: No    Family History  Problem Relation Age of Onset   Heart disease Mother    Diabetes Mother    Heart attack Father    Breast cancer Neg Hx     Review of Systems: A 12-system review of systems was performed and was negative except as noted in the  HPI.  --------------------------------------------------------------------------------------------------  Physical Exam: BP 130/80 (BP Location: Left Arm, Patient Position: Sitting, Cuff Size: Normal)   Pulse (!) 54   Ht '5\' 1"'$  (1.549 m)   Wt 150 lb (68 kg)   SpO2 98%   BMI 28.34 kg/m   General:  NAD. Neck: No JVD or HJR. Lungs: Clear to auscultation bilaterally without wheezes or crackles. Heart: Regular rate and rhythm with 2/6 systolic murmur.  No rubs or gallops. Abdomen: Soft, nontender, nondistended. Extremities: No lower extremity edema.  EKG: Sinus bradycardia.  Otherwise, no significant abnormality.  Lab Results  Component Value Date   WBC 21.3 (H) 05/14/2021   HGB 12.6 05/14/2021   HCT 38.5 05/14/2021   MCV 87.1 05/14/2021   PLT 271 05/14/2021    Lab Results  Component Value Date   NA 140 05/14/2021   K 4.8 05/14/2021   CL 105 05/14/2021   CO2 26 05/14/2021   BUN 18 05/14/2021   CREATININE 0.65 05/14/2021   GLUCOSE 229 (H) 05/14/2021   ALT 26 05/13/2021    Lab Results  Component Value Date  CHOL 142 03/26/2021   HDL 62 03/26/2021   LDLCALC 68 03/26/2021   TRIG 61 03/26/2021   CHOLHDL 2.3 03/26/2021    --------------------------------------------------------------------------------------------------  ASSESSMENT AND PLAN: Coronary artery disease: Ms. Rathe is now just over a year from her anterolateral STEMI.  Her clopidogrel was discontinued last month due to epistaxis.  I think it is reasonable to continue with single agent antiplatelet therapy with aspirin 81 mg daily to prevent bleeding.  This should be continued indefinitely.  We will continue metoprolol and atorvastatin for secondary prevention.  Chronic HFpEF: Ms. Shone appears euvolemic with stable NYHA class I symptoms.  She does not need standing diuretic therapy.  Continued sodium restriction was encouraged.  Hypertension: Blood pressure borderline today (goal less than 130/80 was  paren: No medication changes recommended today.  Hyperlipidemia associated with type 2 diabetes mellitus: Lipids well controlled on last check by Dr. Providence Crosby and March (LDL 48).  In order to minimize potential for side effects, we will continue atorvastatin 20 mg daily given LDL less than 50.  Preoperative cardiovascular risk assessment: Ms. Stembridge does not have any symptoms of unstable cardiac disease.  She is able to perform more than 4 METS of activity.  I think it is reasonable for her to proceed with EGD without further intervention or testing, as this is a low risk procedure.  Aspirin 81 mg daily must be maintained in the perioperative period.  Follow-up: Return to clinic in 1 year.  Nelva Bush, MD 05/02/2022 10:13 AM

## 2022-05-02 ENCOUNTER — Encounter: Payer: Self-pay | Admitting: Internal Medicine

## 2022-05-02 ENCOUNTER — Ambulatory Visit: Payer: Medicare Other | Admitting: Internal Medicine

## 2022-05-02 VITALS — BP 130/80 | HR 54 | Ht 61.0 in | Wt 150.0 lb

## 2022-05-02 DIAGNOSIS — Z01810 Encounter for preprocedural cardiovascular examination: Secondary | ICD-10-CM

## 2022-05-02 DIAGNOSIS — E785 Hyperlipidemia, unspecified: Secondary | ICD-10-CM

## 2022-05-02 DIAGNOSIS — I10 Essential (primary) hypertension: Secondary | ICD-10-CM

## 2022-05-02 DIAGNOSIS — E1169 Type 2 diabetes mellitus with other specified complication: Secondary | ICD-10-CM | POA: Diagnosis not present

## 2022-05-02 DIAGNOSIS — I5032 Chronic diastolic (congestive) heart failure: Secondary | ICD-10-CM

## 2022-05-02 DIAGNOSIS — I2510 Atherosclerotic heart disease of native coronary artery without angina pectoris: Secondary | ICD-10-CM | POA: Diagnosis not present

## 2022-05-02 MED ORDER — NITROGLYCERIN 0.4 MG SL SUBL: 0.4000 mg | SUBLINGUAL_TABLET | SUBLINGUAL | 3 refills | Status: DC | PRN

## 2022-05-02 NOTE — Patient Instructions (Signed)
Medication Instructions:   Your physician recommends that you continue on your current medications as directed. Please refer to the Current Medication list given to you today.  Refills have been sent for your Nitroglycerin  *If you need a refill on your cardiac medications before your next appointment, please call your pharmacy*   Lab Work:  None ordered  Testing/Procedures:  None ordered   Follow-Up: At [facility], you and your health needs are our priority.  As part of our continuing mission to provide you with exceptional heart care, we have created designated Provider Care Teams.  These Care Teams include your primary Cardiologist (physician) and Advanced Practice Providers (APPs -  Physician Assistants and Nurse Practitioners) who all work together to provide you with the care you need, when you need it.  We recommend signing up for the patient portal called "MyChart".  Sign up information is provided on this After Visit Summary.  MyChart is used to connect with patients for Virtual Visits (Telemedicine).  Patients are able to view lab/test results, encounter notes, upcoming appointments, etc.  Non-urgent messages can be sent to your provider as well.   To learn more about what you can do with MyChart, go to [email or web address].    Your next appointment:   1 year(s)  The format for your next appointment:   In Person  Provider:   You may see Nelva Bush, MD or one of the following Advanced Practice Providers on your designated Care Team:   Murray Hodgkins, NP Christell Faith, PA-C Cadence Kathlen Mody, PA-C{   Important Information About Sugar

## 2022-05-03 ENCOUNTER — Encounter: Payer: Self-pay | Admitting: Internal Medicine

## 2022-06-11 ENCOUNTER — Encounter: Admission: RE | Payer: Self-pay | Source: Ambulatory Visit

## 2022-06-11 ENCOUNTER — Ambulatory Visit: Admission: RE | Admit: 2022-06-11 | Payer: Medicare Other | Source: Ambulatory Visit

## 2022-06-11 SURGERY — EGD (ESOPHAGOGASTRODUODENOSCOPY)
Anesthesia: General

## 2022-07-25 ENCOUNTER — Other Ambulatory Visit: Payer: Self-pay | Admitting: Medical

## 2022-07-25 DIAGNOSIS — I25118 Atherosclerotic heart disease of native coronary artery with other forms of angina pectoris: Secondary | ICD-10-CM

## 2022-08-15 ENCOUNTER — Other Ambulatory Visit: Payer: Self-pay

## 2022-08-16 MED ORDER — ATORVASTATIN CALCIUM 20 MG PO TABS: 20.0000 mg | ORAL_TABLET | Freq: Every day | ORAL | 1 refills | Status: DC

## 2023-02-17 ENCOUNTER — Ambulatory Visit: Payer: Medicare Other | Admitting: Dermatology

## 2023-02-17 ENCOUNTER — Encounter: Payer: Self-pay | Admitting: Dermatology

## 2023-02-17 VITALS — BP 131/88 | HR 63

## 2023-02-17 DIAGNOSIS — D1801 Hemangioma of skin and subcutaneous tissue: Secondary | ICD-10-CM

## 2023-02-17 DIAGNOSIS — Z1283 Encounter for screening for malignant neoplasm of skin: Secondary | ICD-10-CM

## 2023-02-17 DIAGNOSIS — D225 Melanocytic nevi of trunk: Secondary | ICD-10-CM | POA: Diagnosis not present

## 2023-02-17 DIAGNOSIS — L814 Other melanin hyperpigmentation: Secondary | ICD-10-CM

## 2023-02-17 DIAGNOSIS — L578 Other skin changes due to chronic exposure to nonionizing radiation: Secondary | ICD-10-CM | POA: Diagnosis not present

## 2023-02-17 DIAGNOSIS — L570 Actinic keratosis: Secondary | ICD-10-CM | POA: Diagnosis not present

## 2023-02-17 DIAGNOSIS — L821 Other seborrheic keratosis: Secondary | ICD-10-CM

## 2023-02-17 DIAGNOSIS — D229 Melanocytic nevi, unspecified: Secondary | ICD-10-CM

## 2023-02-17 DIAGNOSIS — L719 Rosacea, unspecified: Secondary | ICD-10-CM

## 2023-02-17 MED ORDER — METRONIDAZOLE 0.75 % EX CREA: TOPICAL_CREAM | Freq: Every day | CUTANEOUS | 11 refills | Status: AC

## 2023-02-17 NOTE — Patient Instructions (Addendum)
For Irritated mole on upper back Start over the counter Hydrocortisone cream once daily for itching    Cryotherapy Aftercare  Wash gently with soap and water everyday.   Apply Vaseline and Band-Aid daily until healed.   Due to recent changes in healthcare laws, you may see results of your pathology and/or laboratory studies on MyChart before the doctors have had a chance to review them. We understand that in some cases there may be results that are confusing or concerning to you. Please understand that not all results are received at the same time and often the doctors may need to interpret multiple results in order to provide you with the best plan of care or course of treatment. Therefore, we ask that you please give [place] 2 business days to thoroughly review all your results before contacting the office for clarification. Should we see a critical lab result, you will be contacted sooner.   If You Need Anything After Your Visit  If you have any questions or concerns for your doctor, please call our main line at [phone] and press option 4 to reach your doctor's medical assistant. If no one answers, please leave a voicemail as directed and we will return your call as soon as possible. Messages left after 4 pm will be answered the following business day.   You may also send [place] a message via [employer]. We typically respond to MyChart messages within 1-2 business days.  For prescription refills, please ask your pharmacy to contact our office. Our fax number is [phone].  If you have an urgent issue when the clinic is closed that cannot wait until the next business day, you can page your doctor at the number below.    Please note that while we do our best to be available for urgent issues outside of office hours, we are not available 24/7.   If you have an urgent issue and are unable to reach [place], you may choose to seek medical care at your doctor's office, retail clinic, urgent care center,  or emergency room.  If you have a medical emergency, please immediately call 911 or go to the emergency department.  Pager Numbers  - Dr. Nehemiah Massed: [phone]  - Dr. Laurence Ferrari: [phone]  - Dr. Nicole Kindred: [phone]  In the event of inclement weather, please call our main line at [phone] for an update on the status of any delays or closures.  Dermatology Medication Tips: Please keep the boxes that topical medications come in in order to help keep track of the instructions about where and how to use these. Pharmacies typically print the medication instructions only on the boxes and not directly on the medication tubes.   If your medication is too expensive, please contact our office at [phone] option 4 or send [place] a message through [place].   We are unable to tell what your co-pay for medications will be in advance as this is different depending on your insurance coverage. However, we may be able to find a substitute medication at lower cost or fill out paperwork to get insurance to cover a needed medication.   If a prior authorization is required to get your medication covered by your insurance company, please allow [place] 1-2 business days to complete this process.  Drug prices often vary depending on where the prescription is filled and some pharmacies may offer cheaper prices.  The website www.goodrx.com contains coupons for medications through different pharmacies. The prices here do not account for what the cost  may be with help from insurance (it may be cheaper with your insurance), but the website can give you the price if you did not use any insurance.  - You can print the associated coupon and take it with your prescription to the pharmacy.  - You may also stop by our office during regular business hours and pick up a GoodRx coupon card.  - If you need your prescription sent electronically to a different pharmacy, notify our office through [facility] or by phone at  [phone] option 4.     Si Usted Necesita Algo Despus de Su Visita  Tambin puede enviarnos un mensaje a travs de Pharmacist, community. Por lo general respondemos a los mensajes de MyChart en el transcurso de 1 a 2 das hbiles.  Para renovar recetas, por favor pida a su farmacia que se ponga en contacto con nuestra oficina. Harland Dingwall de fax es [place] [phone].  Si tiene un asunto urgente cuando la clnica est cerrada y que no puede esperar hasta el siguiente da hbil, puede llamar/localizar a su doctor(a) al nmero que aparece a continuacin.   Por favor, tenga en cuenta que aunque hacemos todo lo posible para estar disponibles para asuntos urgentes fuera del horario de [place], no estamos disponibles las 24 horas del da, los 7 das de la [place].   Si tiene un problema urgente y no puede comunicarse con nosotros, puede optar por buscar atencin mdica  en el consultorio de su doctor(a), en una clnica privada, en un centro de atencin urgente o en una sala de emergencias.  Si tiene Engineering geologist, por favor llame inmediatamente al 911 o vaya a la sala de emergencias.  Nmeros de bper  - Dr. Nehemiah Massed: [phone]  - Dra. Moye: [phone]  - Dra. Nicole Kindred: [phone]  En caso de inclemencias del [place], por favor llame a Johnsie Kindred principal al [phone] para una actualizacin sobre el [place] de cualquier retraso o cierre.  Consejos para la medicacin en dermatologa: Por favor, guarde las cajas en las que vienen los medicamentos de uso tpico para ayudarle a seguir las instrucciones sobre dnde y cmo usarlos. Las farmacias generalmente imprimen las instrucciones del medicamento slo en las cajas y no directamente en los tubos del [place].   Si su medicamento es muy caro, por favor, pngase en contacto con Zigmund Daniel llamando al [phone] y presione la opcin 4 o envenos un mensaje a travs de Pharmacist, community.   No podemos decirle cul ser su copago por los  medicamentos por adelantado ya que esto es diferente dependiendo de la cobertura de su seguro. Sin embargo, es posible que podamos encontrar un medicamento sustituto a Electrical engineer un formulario para que el seguro cubra el medicamento que se considera necesario.   Si se requiere una autorizacin previa para que su compaa de seguros [place] su medicamento, por favor permtanos de 1 a 2 das hbiles para completar este proceso.  Los precios de los medicamentos varan con frecuencia dependiendo del Environmental consultant de dnde se surte la receta y alguna farmacias pueden ofrecer precios ms baratos.  El sitio web www.goodrx.com tiene cupones para medicamentos de Airline pilot. Los precios aqu no tienen en cuenta lo que podra costar con la ayuda del seguro (puede ser ms barato con su seguro), pero el sitio web puede darle el precio si no utiliz Research scientist (physical sciences).  - Puede imprimir el cupn correspondiente y llevarlo con su receta a la farmacia.  - Tambin puede pasar por Cleotis Nipper  oficina durante el horario de atencin regular y Charity fundraiser una tarjeta de cupones de GoodRx.  - Si necesita que su receta se enve electrnicamente a una farmacia diferente, informe a nuestra oficina a travs de MyChart de [place] o por telfono llamando al [phone] y presione la opcin 4.

## 2023-02-17 NOTE — Progress Notes (Signed)
Follow-Up Visit   Subjective  Elaine Wright is a 67 y.o. female who presents for the following: Total body skin exam, Rosacea (Face, Metronidazole 0.75% cr qhs), and scaly spot (L lower lip, comes and goes).  The patient presents for Total-Body Skin Exam (TBSE) for skin cancer screening and mole check.  The patient has spots, moles and lesions to be evaluated, some may be new or changing and the patient has concerns that these could be cancer.   The following portions of the chart were reviewed this encounter and updated as appropriate:       Review of Systems:  No other skin or systemic complaints except as noted in HPI or Assessment and Plan.  Objective  Well appearing patient in no apparent distress; mood and affect are within normal limits.  A full examination was performed including scalp, head, eyes, ears, nose, lips, neck, chest, axillae, abdomen, back, buttocks, bilateral upper extremities, bilateral lower extremities, hands, feet, fingers, toes, fingernails, and toenails. All findings within normal limits unless otherwise noted below.  L post upper arm 1.0 x 0.6cm speckled brown macule with irregular border, no changes when compared to photo  face Erythema with telangiectasias malar cheeks  L lat lower lip at vermillion x 1 Pink scaly macule    R post thigh 7.0 x 4.72m med brown macule  Spinal upper back 6.058mpink flesh pap  R buttock 2.51m70med dark brown macule    Assessment & Plan   Lentigines - Scattered tan macules - Due to sun exposure - Benign-appearing, observe - Recommend daily broad spectrum sunscreen SPF 30+ to sun-exposed areas, reapply every 2 hours as needed. - Call for any changes - back  Seborrheic Keratoses - Stuck-on, waxy, tan-brown papules and/or plaques  - Benign-appearing - Discussed benign etiology and prognosis. - Observe - Call for any changes - arms, legs  Melanocytic Nevi - Tan-brown and/or pink-flesh-colored  symmetric macules and papules - Benign appearing on exam today - Observation - Call clinic for new or changing moles - Recommend daily use of broad spectrum spf 30+ sunscreen to sun-exposed areas.  - buttocks  Hemangiomas - Red papules - Discussed benign nature - Observe - Call for any changes  Actinic Damage - Chronic condition, secondary to cumulative UV/sun exposure - diffuse scaly erythematous macules with underlying dyspigmentation - Recommend daily broad spectrum sunscreen SPF 30+ to sun-exposed areas, reapply every 2 hours as needed.  - Staying in the shade or wearing long sleeves, sun glasses (UVA+UVB protection) and wide brim hats (4-inch brim around the entire circumference of the hat) are also recommended for sun protection.  - Call for new or changing lesions. - chest  Skin cancer screening performed today.   Nevus spilus L post upper arm  Benign-appearing. Stable compared to previous visit. Observation.  Call clinic for new or changing moles.  Recommend daily use of broad spectrum spf 30+ sunscreen to sun-exposed areas.    Rosacea face  Chronic condition with duration or expected duration over one year. Currently well-controlled.   Rosacea is a chronic progressive skin condition usually affecting the face of adults, causing redness and/or acne bumps. It is treatable but not curable. It sometimes affects the eyes (ocular rosacea) as well. It may respond to topical and/or systemic medication and can flare with stress, sun exposure, alcohol, exercise, topical steroids (including hydrocortisone/cortisone 10) and some foods.  Daily application of broad spectrum spf 30+ sunscreen to face is recommended to reduce flares.  Counseling  for BBL / IPL / Laser and Coordination of Care Discussed the treatment option of Broad Band Light (BBL) /Intense Pulsed Light (IPL)/ Laser for skin discoloration, including brown spots and redness.  Typically we recommend at least 1-3 treatment  sessions about 5-8 weeks apart for best results.  Cannot have tanned skin when BBL performed, and regular use of sunscreen is advised after the procedure to help maintain results. The patient's condition may also require "maintenance treatments" in the future.  The fee for BBL / laser treatments is $350 per treatment session for the whole face.  A fee can be quoted for other parts of the body.  Insurance typically does not pay for BBL/laser treatments and therefore the fee is an out-of-pocket cost.   Cont Metronidazole 0.75% cr qhs  metroNIDAZOLE (METROCREAM) 0.75 % cream - face Apply topically at bedtime. Qhs to face  AK (actinic keratosis) L lat lower lip at vermillion x 1  Destruction of lesion - L lat lower lip at vermillion x 1  Destruction method: cryotherapy   Informed consent: discussed and consent obtained   Lesion destroyed using liquid nitrogen: Yes   Region frozen until ice ball extended beyond lesion: Yes   Outcome: patient tolerated procedure well with no complications   Post-procedure details: wound care instructions given   Additional details:  Prior to procedure, discussed risks of blister formation, small wound, skin dyspigmentation, or rare scar following cryotherapy. Recommend Vaseline ointment to treated areas while healing.   Nevus (3) R buttock; R post thigh; Spinal upper back  Irritated at spinal upper back Recommend otc HC cream qd prn itching Discussed shave removal if symptoms don't improve  R post thigh Nevus vs SK Benign-appearing.  Observation.  Call clinic for new or changing lesions.  Recommend daily use of broad spectrum spf 30+ sunscreen to sun-exposed areas.    R buttock Nevus Benign-appearing.  Observation.  Call clinic for new or changing moles.  Recommend daily use of broad spectrum spf 30+ sunscreen to sun-exposed areas.     Return in about 1 year (around 02/17/2024) for TBSE, Hx of AKs.  I, Othelia Pulling, RMA, am acting as scribe for Brendolyn Patty, MD .  Documentation: I have reviewed the above documentation for accuracy and completeness, and I agree with the above.  Brendolyn Patty MD

## 2023-03-07 ENCOUNTER — Other Ambulatory Visit: Payer: Self-pay | Admitting: Internal Medicine

## 2023-03-12 ENCOUNTER — Other Ambulatory Visit: Payer: Self-pay | Admitting: Family Medicine

## 2023-03-12 DIAGNOSIS — Z1231 Encounter for screening mammogram for malignant neoplasm of breast: Secondary | ICD-10-CM

## 2023-03-13 ENCOUNTER — Ambulatory Visit
Admission: RE | Admit: 2023-03-13 | Discharge: 2023-03-13 | Disposition: A | Discharge status: Home / Self Care | Payer: Medicare Other | Source: Ambulatory Visit | Attending: Family Medicine | Admitting: Family Medicine

## 2023-03-13 DIAGNOSIS — Z87891 Personal history of nicotine dependence: Secondary | ICD-10-CM | POA: Diagnosis present

## 2023-03-17 ENCOUNTER — Other Ambulatory Visit: Payer: Self-pay

## 2023-03-17 DIAGNOSIS — Z87891 Personal history of nicotine dependence: Secondary | ICD-10-CM

## 2023-04-08 ENCOUNTER — Ambulatory Visit
Admission: RE | Admit: 2023-04-08 | Discharge: 2023-04-08 | Disposition: A | Discharge status: Home / Self Care | Payer: Medicare Other | Source: Ambulatory Visit | Attending: Family Medicine | Admitting: Family Medicine

## 2023-04-08 DIAGNOSIS — Z1231 Encounter for screening mammogram for malignant neoplasm of breast: Secondary | ICD-10-CM

## 2023-06-14 ENCOUNTER — Other Ambulatory Visit: Payer: Self-pay | Admitting: Internal Medicine

## 2023-06-16 NOTE — Telephone Encounter (Signed)
Left message on voice mail to schedule

## 2023-06-16 NOTE — Telephone Encounter (Signed)
Please schedule overdue F/U appointment for further refills. Thank you! 

## 2023-06-17 ENCOUNTER — Other Ambulatory Visit: Payer: Self-pay | Admitting: Internal Medicine

## 2023-06-17 NOTE — Telephone Encounter (Signed)
PATIENT IS OVERDUE YEARLY FOLLOW UP WITH NO APPOINTMENT SCHEDULED AT THIS TIME.  MUST HAVE YEARLY FOLLOW UP PRIOR TO 90 DAY SUPPLY

## 2023-07-25 ENCOUNTER — Other Ambulatory Visit: Payer: Self-pay | Admitting: Internal Medicine

## 2023-08-20 ENCOUNTER — Other Ambulatory Visit: Payer: Self-pay | Admitting: Internal Medicine

## 2023-09-19 ENCOUNTER — Encounter: Payer: Self-pay | Admitting: Internal Medicine

## 2023-09-19 ENCOUNTER — Ambulatory Visit: Payer: Medicare Other | Attending: Internal Medicine | Admitting: Internal Medicine

## 2023-09-19 VITALS — BP 128/90 | HR 55 | Ht 60.0 in | Wt 154.0 lb

## 2023-09-19 DIAGNOSIS — I2102 ST elevation (STEMI) myocardial infarction involving left anterior descending coronary artery: Secondary | ICD-10-CM

## 2023-09-19 DIAGNOSIS — I2510 Atherosclerotic heart disease of native coronary artery without angina pectoris: Secondary | ICD-10-CM | POA: Diagnosis not present

## 2023-09-19 DIAGNOSIS — E1169 Type 2 diabetes mellitus with other specified complication: Secondary | ICD-10-CM | POA: Diagnosis not present

## 2023-09-19 DIAGNOSIS — I5032 Chronic diastolic (congestive) heart failure: Secondary | ICD-10-CM

## 2023-09-19 DIAGNOSIS — Z79899 Other long term (current) drug therapy: Secondary | ICD-10-CM

## 2023-09-19 DIAGNOSIS — E785 Hyperlipidemia, unspecified: Secondary | ICD-10-CM

## 2023-09-19 DIAGNOSIS — R079 Chest pain, unspecified: Secondary | ICD-10-CM

## 2023-09-19 DIAGNOSIS — I10 Essential (primary) hypertension: Secondary | ICD-10-CM | POA: Diagnosis not present

## 2023-09-19 MED ORDER — NITROGLYCERIN 0.4 MG SL SUBL: 0.4000 mg | SUBLINGUAL_TABLET | SUBLINGUAL | 3 refills | Status: AC | PRN

## 2023-09-19 MED ORDER — HYDROCHLOROTHIAZIDE 12.5 MG PO CAPS
12.5000 mg | ORAL_CAPSULE | Freq: Every day | ORAL | 3 refills | Status: DC
Start: 2023-09-19 — End: 2024-09-20

## 2023-09-19 NOTE — Patient Instructions (Signed)
Medication Instructions:  Your physician recommends the following medication changes.  START TAKING: Hydrochlorothiazide 12.5 mg by mouth daily   *If you need a refill on your cardiac medications before your next appointment, please call your pharmacy*   Lab Work: Your provider would like for you to return in 1 month to have the following labs drawn: (BMP).   Please go to [facility] [address] [address], [place] [address] You do not need an appointment.  They are open from 7:30 am-4 pm.  Lunch from 1:00 pm- 2:00 pm You will not need to be fasting.   Testing/Procedures: No labs ordered today    Follow-Up: At [facility], you and your health needs are our priority.  As part of our continuing mission to provide you with exceptional heart care, we have created designated Provider Care Teams.  These Care Teams include your primary Cardiologist (physician) and Advanced Practice Providers (APPs -  Physician Assistants and Nurse Practitioners) who all work together to provide you with the care you need, when you need it.  We recommend signing up for the patient portal called "MyChart".  Sign up information is provided on this After Visit Summary.  MyChart is used to connect with patients for Virtual Visits (Telemedicine).  Patients are able to view lab/test results, encounter notes, upcoming appointments, etc.  Non-urgent messages can be sent to your provider as well.   To learn more about what you can do with MyChart, go to [email or web address].    Your next appointment:   6 week(s)  Provider:   You will see one of the following Advanced Practice Providers on your designated Care Team:   Nicolasa Ducking, NP Eula Listen, PA-C Cadence Fransico Michael, PA-C Charlsie Quest, NP

## 2023-09-19 NOTE — Progress Notes (Unsigned)
Cardiology Office Note:  .   Date:  09/19/2023  ID:  [name], DOB 27-Jan-1956, MRN [id number] PCP: Dorothey Baseman, MD  [facility] Providers Cardiologist:  Yvonne Kendall, MD { Click to update primary MD,subspecialty MD or APP then REFRESH:1}    History of Present Illness: .   Elaine Wright is a 67 y.o. female with history of coronary artery disease with anterolateral STEMI in 03/2021 status post PCI to ostial/proximal LAD, chronic HFpEF, hyperlipidemia, type 2 diabetes mellitus, obstructive sleep apnea, and osteoarthritis, who presents for follow-up of coronary artery disease.  I last saw her in 04/2022, which time she was doing well from a heart standpoint.  We have previously discontinued clopidogrel due to epistaxis, which has not recurred.  ROS: See HPI  Studies Reviewed: .        *** Risk Assessment/Calculations:   {Does this patient have ATRIAL FIBRILLATION?[phone]} The patient's 1st BP is elevated (>139/89)*** Repeat BP and {Click to enter a 2nd BP Refresh Note  :1}       Physical Exam:   VS:  BP (!) 128/90 (BP Location: Left Arm, Patient Position: Sitting, Cuff Size: Normal)   Pulse (!) 55   Ht 5' (1.524 m)   Wt 154 lb (69.9 kg)   SpO2 98%   BMI 30.08 kg/m    Wt Readings from Last 3 Encounters:  09/19/23 154 lb (69.9 kg)  05/02/22 150 lb (68 kg)  03/12/22 147 lb (66.7 kg)    General:  NAD. Neck: No JVD or HJR. Lungs: Clear to auscultation bilaterally without wheezes or crackles. Heart: Regular rate and rhythm without murmurs, rubs, or gallops. Abdomen: Soft, nontender, nondistended. Extremities: No lower extremity edema.  ASSESSMENT AND PLAN: .    ***    {Are you ordering a CV Procedure (e.g. stress test, cath, DCCV, TEE, etc)?   Press F2        :[id number]}  Dispo: ***  Signed, Yvonne Kendall, MD

## 2023-09-21 ENCOUNTER — Encounter: Payer: Self-pay | Admitting: Internal Medicine

## 2023-09-21 DIAGNOSIS — R079 Chest pain, unspecified: Secondary | ICD-10-CM | POA: Insufficient documentation

## 2023-10-07 ENCOUNTER — Other Ambulatory Visit: Payer: Self-pay | Admitting: Internal Medicine

## 2023-10-08 ENCOUNTER — Other Ambulatory Visit: Payer: Self-pay

## 2023-10-18 LAB — BASIC METABOLIC PANEL
BUN/Creatinine Ratio: 22 (ref 12–28)
BUN: 19 mg/dL (ref 8–27)
CO2: 25 mmol/L (ref 20–29)
Calcium: 9.6 mg/dL (ref 8.7–10.3)
Chloride: 99 mmol/L (ref 96–106)
Creatinine, Ser: 0.88 mg/dL (ref 0.57–1.00)
Glucose: 123 mg/dL — ABNORMAL HIGH (ref 70–99)
Potassium: 5 mmol/L (ref 3.5–5.2)
Sodium: 141 mmol/L (ref 134–144)
eGFR: 72 mL/min/{1.73_m2} (ref >59)

## 2023-10-22 ENCOUNTER — Other Ambulatory Visit: Payer: Self-pay | Admitting: Internal Medicine

## 2023-11-03 ENCOUNTER — Ambulatory Visit: Payer: Medicare Other | Attending: Medical | Admitting: Medical

## 2023-11-03 ENCOUNTER — Encounter: Payer: Self-pay | Admitting: Medical

## 2023-11-03 VITALS — BP 128/70 | HR 65 | Ht 61.0 in | Wt 153.5 lb

## 2023-11-03 DIAGNOSIS — E785 Hyperlipidemia, unspecified: Secondary | ICD-10-CM

## 2023-11-03 DIAGNOSIS — I2510 Atherosclerotic heart disease of native coronary artery without angina pectoris: Secondary | ICD-10-CM

## 2023-11-03 DIAGNOSIS — I10 Essential (primary) hypertension: Secondary | ICD-10-CM

## 2023-11-03 NOTE — Progress Notes (Signed)
Cardiology Office Note:    Date:  11/03/2023   ID:  Elaine Wright, DOB 07-Oct-1956, MRN [id number]  PCP:  Dorothey Baseman, MD  [facility] Cardiologist:  Yvonne Kendall, MD  [facility] Electrophysiologist:  None   Referring MD: Dorothey Baseman, MD   Chief Complaint: 6 week follow-up  History of Present Illness:    Elaine Wright is a 67 y.o. female with a hx of coronary artery disease with anterolateral STEMI in 03/2021 s/p PCI to the ostial/proximal LAD, chronic HFpEF, HLD, DM2, OSA, and OA who presents for follow-up.   The patient was last seen 09/19/23 reported sharp chest pain that were positional and brief. Plan was to continue medical management. BP was mildly elevated and hydrochlorothiazide was added.   Today, the blood pressure is better. She has cut back on coffee since the last visit. She doesn't check BP at home. She denies chest pain or shortness of breath. She goes to the gym 3-4 times week. She does not cook with a lot of salt.   Past Medical History:  Diagnosis Date   Actinic keratosis    Arthritis    Coronary artery disease 03/24/2021   Anterior STEMI s/p PCI to ostial/proxiimal LAD   Diabetes mellitus without complication (HCC)    GERD (gastroesophageal reflux disease)    Sleep apnea     Past Surgical History:  Procedure Laterality Date   ABDOMINAL HYSTERECTOMY  2003   ANTERIOR AND POSTERIOR REPAIR N/A 04/14/2020   Procedure: ANTERIOR (CYSTOCELE) AND POSTERIOR REPAIR (RECTOCELE);  Surgeon: Schermerhorn, Ihor Austin, MD;  Location: [facility] ORS;  Service: Gynecology;  Laterality: N/A;   CARDIAC CATHETERIZATION     CORONARY/GRAFT ACUTE MI REVASCULARIZATION N/A 03/24/2021   Procedure: Coronary/Graft Acute MI Revascularization;  Surgeon: Kathleene Hazel, MD;  Location: [facility] INVASIVE CV LAB;  Service: Cardiovascular;  Laterality: N/A;   ESOPHAGOGASTRODUODENOSCOPY (EGD) WITH PROPOFOL N/A 05/28/2021   Procedure: ESOPHAGOGASTRODUODENOSCOPY (EGD) WITH  PROPOFOL;  Surgeon: Wyline Mood, MD;  Location: [facility] ENDOSCOPY;  Service: Gastroenterology;  Laterality: N/A;   EYE SURGERY  2011   Cataract bil   LEFT HEART CATH AND CORONARY ANGIOGRAPHY N/A 03/24/2021   Procedure: LEFT HEART CATH AND CORONARY ANGIOGRAPHY;  Surgeon: Kathleene Hazel, MD;  Location: [facility] INVASIVE CV LAB;  Service: Cardiovascular;  Laterality: N/A;   PARTIAL KNEE ARTHROPLASTY  2009   right    Thumb joint     Tendon from arm to replace arthrititic thumb   TOTAL KNEE ARTHROPLASTY  12/23/2012   Procedure: TOTAL KNEE ARTHROPLASTY;  Surgeon: Loreta Ave, MD;  Location: [facility];  Service: Orthopedics;  Laterality: Left;   total vaginectomy     TRACHELECTOMY N/A 04/14/2020   Procedure: LAPAROSCOPIC TRACHELECTOMY;  Surgeon: Schermerhorn, Ihor Austin, MD;  Location: [facility] ORS;  Service: Gynecology;  Laterality: N/A;    Current Medications: Current Meds  Medication Sig   albuterol (VENTOLIN HFA) 108 (90 Base) MCG/ACT inhaler Inhale 2 puffs into the lungs every 4 (four) hours as needed.   alendronate (FOSAMAX) 70 MG tablet Take 70 mg by mouth once a week.   aspirin 81 MG chewable tablet Chew 1 tablet (81 mg total) by mouth daily.   atorvastatin (LIPITOR) 20 MG tablet TAKE 1 TABLET(20 MG) BY MOUTH DAILY   azelastine (ASTELIN) 0.1 % nasal spray Place 1 spray into both nostrils daily as needed.   Carboxymethylcellulose Sodium (THERATEARS OP) Place 1 drop into both eyes daily as needed (when using contacts).   hydrochlorothiazide (MICROZIDE)  12.5 MG capsule Take 1 capsule (12.5 mg total) by mouth daily.   loratadine (CLARITIN) 10 MG tablet Take 10 mg by mouth daily.   metFORMIN (GLUCOPHAGE-XR) 500 MG 24 hr tablet Take 500 mg by mouth 2 (two) times daily.   metoprolol succinate (TOPROL-XL) 25 MG 24 hr tablet TAKE 1 TABLET(25 MG) BY MOUTH DAILY   metroNIDAZOLE (METROCREAM) 0.75 % cream Apply topically at bedtime. Qhs to face   Multiple Vitamin (MULTI-VITAMIN) tablet Take 1 tablet by  mouth daily.   nitroGLYCERIN (NITROSTAT) 0.4 MG SL tablet Place 1 tablet (0.4 mg total) under the tongue every 5 (five) minutes as needed for chest pain.   omeprazole (PRILOSEC) 20 MG capsule Take 20 mg by mouth daily.   pioglitazone (ACTOS) 15 MG tablet Take 15 mg by mouth daily.   TRELEGY ELLIPTA 200-62.5-25 MCG/ACT AEPB Inhale 1 puff into the lungs daily as needed.     Allergies:   Morphine and codeine   Social History   Socioeconomic History   Marital status: Married    Spouse name: Not on file   Number of children: Not on file   Years of education: Not on file   Highest education level: Not on file  Occupational History   Not on file  Tobacco Use   Smoking status: Former    Current packs/day: 0.00    Average packs/day: 0.5 packs/day for 45.0 years (22.5 ttl pk-yrs)    Types: Cigarettes    Start date: 03/11/1976    Quit date: 03/11/2021    Years since quitting: 2.6   Smokeless tobacco: Never  Vaping Use   Vaping status: Former  Substance and Sexual Activity   Alcohol use: Yes    Comment: occassional glass of wine some afternoons   Drug use: No   Sexual activity: Not on file  Other Topics Concern   Not on file  Social History Narrative   Not on file   Social Determinants of Health   Financial Resource Strain: Low Risk  (03/12/2023)   Received from [facility], [facility]   Overall Financial Resource Strain (CARDIA)    Difficulty of Paying Living Expenses: Not hard at all  Food Insecurity: No Food Insecurity (03/12/2023)   Received from [facility], [facility]   Hunger Vital Sign    Worried About Running Out of Food in the Last Year: Never true    Ran Out of Food in the Last Year: Never true  Transportation Needs: No Transportation Needs (03/12/2023)   Received from [facility], [facility]   [facility] - Transportation    In the past 12 months, has lack of  transportation kept you from medical appointments or from getting medications?: No    Lack of Transportation (Non-Medical): No  Physical Activity: Not on file  Stress: Not on file  Social Connections: Not on file     Family History: The patient's family history includes Diabetes in her mother; Heart attack in her father; Heart disease in her mother. There is no history of Breast cancer.  ROS:   Please see the history of present illness.     All other systems reviewed and are negative.  EKGs/Labs/Other Studies Reviewed:    The following studies were reviewed today:  Echo 02/2022 1. Left ventricular ejection fraction, by estimation, is 60 to 65%. The  left ventricle has normal function. The left ventricle has no regional  wall motion abnormalities.  Left ventricular diastolic parameters are  consistent with Grade I diastolic  dysfunction (impaired relaxation). The average left ventricular global  longitudinal strain is -23.4 %. The global longitudinal strain is normal.   2. Right ventricular systolic function is normal. The right ventricular  size is normal.   3. The mitral valve is normal in structure. No evidence of mitral valve  regurgitation.   4. The aortic valve is tricuspid. Aortic valve regurgitation is trivial.  Aortic valve sclerosis is present, with no evidence of aortic valve  stenosis.   5. The inferior vena cava is normal in size with greater than 50%  respiratory variability, suggesting right atrial pressure of 3 mmHg.   Comparison(s): LVEF 60-65%.   Cardiac Catheterization 03/24/21 Conclusion Ost LAD to Prox LAD lesion is 80% stenosed. A drug-eluting stent was successfully placed using a STENT SYNERGY DES 3X12. Post intervention, there is a 0% residual stenosis.   1. Severe proximal LAD stenosis best seen in the LAO view. This was confirmed with IVUS imaging. She likely became ischemic from this lesion when she became hypoxic and hypertensive at home.  2.  Successful PTCA/DES x 1 proximal LAD 3. No obstructive disease in the left main, Circumflex or RCA 4. Elevated LVEDP   Recommendations: Will admit to the ICU on the critical care team. She will remain intubated while her respiratory status is optimized. Continue Aggrastat drip for 2 hours post PCI. Will continue DAPT with ASA and Brilinta for at least one year. I will start a high intensity statin. One dose IV Lasix this am. Echo later today.    Coronary Diagrams Diagnostic Dominance: Right     Intervention       _____________   2D Echocardiogram 03/25/2021: 1. Left ventricular ejection fraction, by estimation, is 60 to 65%. The  left ventricle has normal function. The left ventricle has no regional  wall motion abnormalities. There is mild concentric left ventricular  hypertrophy. Left ventricular diastolic  parameters were normal.   2. Right ventricular systolic function is normal. The right ventricular  size is normal.   3. The mitral valve is normal in structure. Trivial mitral valve  regurgitation. No evidence of mitral stenosis.   4. The aortic valve is tricuspid. Aortic valve regurgitation is not  visualized. No aortic stenosis is present.     EKG:  EKG is  ordered today.  The ekg ordered today demonstrates SB 55bpm, TWI ant/inf leads  Recent Labs: 10/17/2023: BUN 19; Creatinine, Ser 0.88; Potassium 5.0; Sodium 141  Recent Lipid Panel    Component Value Date/Time   CHOL 142 03/26/2021 0455   TRIG 61 03/26/2021 0455   HDL 62 03/26/2021 0455   CHOLHDL 2.3 03/26/2021 0455   VLDL 12 03/26/2021 0455   LDLCALC 68 03/26/2021 0455     Physical Exam:    VS:  BP 128/70 (BP Location: Left Arm, Patient Position: Sitting, Cuff Size: Normal)   Pulse 65   Ht 5\' 1"  (1.549 m)   Wt 153 lb 8 oz (69.6 kg)   SpO2 98%   BMI 29.00 kg/m     Wt Readings from Last 3 Encounters:  11/03/23 153 lb 8 oz (69.6 kg)  09/19/23 154 lb (69.9 kg)  05/02/22 150 lb (68 kg)     GEN:  Well  nourished, well developed in no acute distress HEENT: Normal NECK: No JVD; No carotid bruits LYMPHATICS: No lymphadenopathy CARDIAC: RRR, no murmurs, rubs, gallops RESPIRATORY:  Clear to auscultation without rales, wheezing  or rhonchi  ABDOMEN: Soft, non-tender, non-distended MUSCULOSKELETAL:  No edema; No deformity  SKIN: Warm and dry NEUROLOGIC:  Alert and oriented x 3 PSYCHIATRIC:  Normal affect   ASSESSMENT:    1. Coronary artery disease involving native coronary artery of native heart without angina pectoris   2. Essential hypertension   3. Hyperlipidemia with target LDL less than 70    PLAN:    In order of problems listed above:  CAD The patient denies any further chest pain of shortness of breath. She exercises 3-4 times weekly. Continue Aspirin 81mg  daily, Lipitor 20mg  daily, Toprol 25mg  daily and SL NTG. No further ischemic work-up at this time.   HTN BP is good today at 128/70. The patient does not check BP at home. She has decreased her caffeine intake. Recommended decrease salt intake as well.   HLD with DM2 LDL 39. Continue Lipitor 20mg  daily.   Disposition: Follow up in 1 year(s) with MD/APP    Signed, Rosabella Edgin David Stall, PA-C  11/03/2023 9:45 AM    [facility]

## 2023-11-03 NOTE — Patient Instructions (Signed)
 Medication Instructions:  Your physician recommends that you continue on your current medications as directed. Please refer to the Current Medication list given to you today.   *If you need a refill on your cardiac medications before your next appointment, please call your pharmacy*   Lab Work: No labs ordered today    Testing/Procedures: No test ordered today    Follow-Up: At [facility], you and your health needs are our priority.  As part of our continuing mission to provide you with exceptional heart care, we have created designated Provider Care Teams.  These Care Teams include your primary Cardiologist (physician) and Advanced Practice Providers (APPs -  Physician Assistants and Nurse Practitioners) who all work together to provide you with the care you need, when you need it.  We recommend signing up for the patient portal called "MyChart".  Sign up information is provided on this After Visit Summary.  MyChart is used to connect with patients for Virtual Visits (Telemedicine).  Patients are able to view lab/test results, encounter notes, upcoming appointments, etc.  Non-urgent messages can be sent to your provider as well.   To learn more about what you can do with MyChart, go to [email or web address].    Your next appointment:   1 year(s)  Provider:   You may see Yvonne Kendall, MD or one of the following Advanced Practice Providers on your designated Care Team:   Nicolasa Ducking, NP Eula Listen, PA-C Cadence Fransico Michael, PA-C Charlsie Quest, NP Carlos Levering, NP

## 2024-02-24 ENCOUNTER — Ambulatory Visit (INDEPENDENT_AMBULATORY_CARE_PROVIDER_SITE_OTHER): Payer: Medicare Other | Admitting: Dermatology

## 2024-02-24 DIAGNOSIS — L820 Inflamed seborrheic keratosis: Secondary | ICD-10-CM

## 2024-02-24 DIAGNOSIS — W908XXA Exposure to other nonionizing radiation, initial encounter: Secondary | ICD-10-CM | POA: Diagnosis not present

## 2024-02-24 DIAGNOSIS — L570 Actinic keratosis: Secondary | ICD-10-CM

## 2024-02-24 DIAGNOSIS — D229 Melanocytic nevi, unspecified: Secondary | ICD-10-CM

## 2024-02-24 DIAGNOSIS — Z1283 Encounter for screening for malignant neoplasm of skin: Secondary | ICD-10-CM

## 2024-02-24 DIAGNOSIS — L578 Other skin changes due to chronic exposure to nonionizing radiation: Secondary | ICD-10-CM | POA: Diagnosis not present

## 2024-02-24 DIAGNOSIS — D2271 Melanocytic nevi of right lower limb, including hip: Secondary | ICD-10-CM

## 2024-02-24 DIAGNOSIS — L821 Other seborrheic keratosis: Secondary | ICD-10-CM

## 2024-02-24 DIAGNOSIS — L814 Other melanin hyperpigmentation: Secondary | ICD-10-CM | POA: Diagnosis not present

## 2024-02-24 DIAGNOSIS — D225 Melanocytic nevi of trunk: Secondary | ICD-10-CM

## 2024-02-24 DIAGNOSIS — L719 Rosacea, unspecified: Secondary | ICD-10-CM

## 2024-02-24 DIAGNOSIS — D1801 Hemangioma of skin and subcutaneous tissue: Secondary | ICD-10-CM

## 2024-02-24 MED ORDER — METRONIDAZOLE 0.75 % EX CREA: TOPICAL_CREAM | CUTANEOUS | 11 refills | Status: AC

## 2024-02-24 NOTE — Patient Instructions (Addendum)
 Cryotherapy Aftercare  Wash gently with soap and water everyday.   Apply Vaseline and Band-Aid daily until healed.    Dry, itchy spots: Recommend OTC 1% hydrocortisone cream 1-2 times daily to affected area until itchy rash cleared.   Seborrheic Keratosis  What causes seborrheic keratoses? Seborrheic keratoses are harmless, common skin growths that first appear during adult life.  As time goes by, more growths appear.  Some people may develop a large number of them.  Seborrheic keratoses appear on both covered and uncovered body parts.  They are not caused by sunlight.  The tendency to develop seborrheic keratoses can be inherited.  They vary in color from skin-colored to gray, brown, or even black.  They can be either smooth or have a rough, warty surface.   Seborrheic keratoses are superficial and look as if they were stuck on the skin.  Under the microscope this type of keratosis looks like layers upon layers of skin.  That is why at times the top layer may seem to fall off, but the rest of the growth remains and re-grows.    Treatment Seborrheic keratoses do not need to be treated, but can easily be removed in the office.  Seborrheic keratoses often cause symptoms when they rub on clothing or jewelry.  Lesions can be in the way of shaving.  If they become inflamed, they can cause itching, soreness, or burning.  Removal of a seborrheic keratosis can be accomplished by freezing, burning, or surgery. If any spot bleeds, scabs, or grows rapidly, please return to have it checked, as these can be an indication of a skin cancer.  Due to recent changes in healthcare laws, you may see results of your pathology and/or laboratory studies on MyChart before the doctors have had a chance to review them. We understand that in some cases there may be results that are confusing or concerning to you. Please understand that not all results are received at the same time and often the doctors may need to  interpret multiple results in order to provide you with the best plan of care or course of treatment. Therefore, we ask that you please give [place] 2 business days to thoroughly review all your results before contacting the office for clarification. Should we see a critical lab result, you will be contacted sooner.   If You Need Anything After Your Visit  If you have any questions or concerns for your doctor, please call our main line at [phone] and press option 4 to reach your doctor's medical assistant. If no one answers, please leave a voicemail as directed and we will return your call as soon as possible. Messages left after 4 pm will be answered the following business day.   You may also send [place] a message via MyChart. We typically respond to MyChart messages within 1-2 business days.  For prescription refills, please ask your pharmacy to contact our office. Our fax number is [phone].  If you have an urgent issue when the clinic is closed that cannot wait until the next business day, you can page your doctor at the number below.    Please note that while we do our best to be available for urgent issues outside of office hours, we are not available 24/7.   If you have an urgent issue and are unable to reach [place], you may choose to seek medical care at your doctor's office, retail clinic, urgent care center, or emergency room.  If you have a medical  emergency, please immediately call 911 or go to the emergency department.  Pager Numbers  - Dr. Gwen Pounds: [phone]  - Dr. Roseanne Reno: [phone]  - Dr. Katrinka Blazing: [phone]   In the event of inclement weather, please call our main line at [phone] for an update on the status of any delays or closures.  Dermatology Medication Tips: Please keep the boxes that topical medications come in in order to help keep track of the instructions about where and how to use these. Pharmacies typically print the medication instructions only on  the boxes and not directly on the medication tubes.   If your medication is too expensive, please contact our office at [phone] option 4 or send [place] a message through MyChart.   We are unable to tell what your co-pay for medications will be in advance as this is different depending on your insurance coverage. However, we may be able to find a substitute medication at lower cost or fill out paperwork to get insurance to cover a needed medication.   If a prior authorization is required to get your medication covered by your insurance company, please allow [place] 1-2 business days to complete this process.  Drug prices often vary depending on where the prescription is filled and some pharmacies may offer cheaper prices.  The website www.goodrx.com contains coupons for medications through different pharmacies. The prices here do not account for what the cost may be with help from insurance (it may be cheaper with your insurance), but the website can give you the price if you did not use any insurance.  - You can print the associated coupon and take it with your prescription to the pharmacy.  - You may also stop by our office during regular business hours and pick up a GoodRx coupon card.  - If you need your prescription sent electronically to a different pharmacy, notify our office through [facility] or by phone at [phone] option 4.     Si Usted Necesita Algo Despus de Su Visita  Tambin puede enviarnos un mensaje a travs de Clinical cytogeneticist. Por lo general respondemos a los mensajes de MyChart en el transcurso de 1 a 2 das hbiles.  Para renovar recetas, por favor pida a su farmacia que se ponga en contacto con nuestra oficina. Annie Sable de fax es [place] [phone].  Si tiene un asunto urgente cuando la clnica est cerrada y que no puede esperar hasta el siguiente da hbil, puede llamar/localizar a su doctor(a) al nmero que aparece a continuacin.   Por favor, tenga en cuenta que  aunque hacemos todo lo posible para estar disponibles para asuntos urgentes fuera del horario de [place], no estamos disponibles las 24 horas del da, los 7 [address]  [address] de la [place].   Si tiene un problema urgente y no puede comunicarse con nosotros, puede optar por buscar atencin mdica  en el consultorio de su doctor(a), en una clnica privada, en un centro de atencin urgente o en una sala de emergencias.  Si tiene Engineer, drilling, por favor llame inmediatamente al 911 o vaya a la sala de emergencias.  Nmeros de bper  - Dr. Gwen Pounds: [phone]  - Dra. Roseanne Reno: [phone]  - Dr. Katrinka Blazing: [phone]   En caso de inclemencias del tiempo, por favor llame a Lacy Duverney principal al [phone] para una actualizacin sobre el Alicyn Moore de cualquier retraso o cierre.  Consejos para la medicacin en dermatologa: Por favor, guarde las cajas en las que vienen los medicamentos de uso tpico para  ayudarle a seguir las [employer] dnde y cmo usarlos. Las farmacias generalmente imprimen las instrucciones del medicamento slo en las cajas y no directamente en los tubos del [place].   Si su medicamento es muy caro, por favor, pngase en contacto con Rolm Gala llamando al [phone] y presione la opcin 4 o envenos un mensaje a travs de Clinical cytogeneticist.   No podemos decirle cul ser su copago por los medicamentos por adelantado ya que esto es diferente dependiendo de la cobertura de su seguro. Sin embargo, es posible que podamos encontrar un medicamento sustituto a Audiological scientist un formulario para que el seguro cubra el medicamento que se considera necesario.   Si se requiere una autorizacin previa para que su compaa de seguros [place] su medicamento, por favor permtanos de 1 a 2 das hbiles para completar [address]  Los precios de los medicamentos varan con frecuencia dependiendo del Environmental consultant de dnde se surte la receta y alguna farmacias pueden ofrecer precios ms  baratos.  El sitio web www.goodrx.com tiene cupones para medicamentos de Health and safety inspector. Los precios aqu no tienen en cuenta lo que podra costar con la ayuda del seguro (puede ser ms barato con su seguro), pero el sitio web puede darle el precio si no utiliz Tourist information centre manager.  - Puede imprimir el cupn correspondiente y llevarlo con su receta a la farmacia.  - Tambin puede pasar por nuestra oficina durante el horario de atencin regular y Education officer, museum una tarjeta de cupones de GoodRx.  - Si necesita que su receta se enve electrnicamente a una farmacia diferente, informe a nuestra oficina a travs de MyChart de  o por telfono llamando al [phone] y presione la opcin 4.

## 2024-02-24 NOTE — Progress Notes (Signed)
 Follow-Up Visit   Subjective  Elaine Wright is a 68 y.o. female who presents for the following: Skin Cancer Screening and Full Body Skin Exam  The patient presents for Total-Body Skin Exam (TBSE) for skin cancer screening and mole check. The patient has spots, moles and lesions to be evaluated, some may be new or changing. She has a few dry, itchy spots on her body that she will point out. No history of skin cancer.  Spot on R ankle was red and itchy, but has since calmed down and isn't bothersome  The following portions of the chart were reviewed this encounter and updated as appropriate: medications, allergies, medical history  Review of Systems:  No other skin or systemic complaints except as noted in HPI or Assessment and Plan.  Objective  Well appearing patient in no apparent distress; mood and affect are within normal limits.  A full examination was performed including scalp, head, eyes, ears, nose, lips, neck, chest, axillae, abdomen, back, buttocks, bilateral upper extremities, bilateral lower extremities, hands, feet, fingers, toes, fingernails, and toenails. All findings within normal limits unless otherwise noted below.   Relevant physical exam findings are noted in the Assessment and Plan.  L popliteal x 1, R ant chest x 1, L pretibia x 1, R med upper ankle (not treated), L foot dorsum at base of 4th toe x 1 (4) Erythematous stuck-on, waxy papule L glabella x 1, L eyebrow x 3, L inf mandible x 1, R lat eyebrow x 1 (6) Pink scaly macules.  Assessment & Plan   SKIN CANCER SCREENING PERFORMED TODAY.  ACTINIC DAMAGE - Chronic condition, secondary to cumulative UV/sun exposure - diffuse scaly erythematous macules with underlying dyspigmentation - Recommend daily broad spectrum sunscreen SPF 30+ to sun-exposed areas, reapply every 2 hours as needed.  - Staying in the shade or wearing long sleeves, sun glasses (UVA+UVB protection) and wide brim hats (4-inch brim around the  entire circumference of the hat) are also recommended for sun protection.  - Call for new or changing lesions.  LENTIGINES, SEBORRHEIC KERATOSES (including L upper thigh), HEMANGIOMAS - Benign normal skin lesions - Benign-appearing - Call for any changes  MELANOCYTIC NEVI - Tan-brown and/or pink-flesh-colored symmetric macules and papules - R buttock 2.72mm med dark brown macule - R post thigh 7.0 x 4.73mm med light brown macule (vs SK) - Benign appearing on exam today - Observation - Call clinic for new or changing moles - Recommend daily use of broad spectrum spf 30+ sunscreen to sun-exposed areas.   NEVUS SPILUS, biopsy proven benign 02/04/2011 Exam: 1.0 x 0.6cm speckled brown macule with irregular border at L post upper arm, no changes when compared to photo (02/05/2021), pt states present as long as she can remember.  Benign-appearing. Stable compared to previous visit. Observation.  Call clinic for new or changing moles.  Recommend daily use of broad spectrum spf 30+ sunscreen to sun-exposed areas.    ROSACEA Exam Erythema with telangiectasias malar cheeks and nose.  Chronic and persistent condition with duration or expected duration over one year. Condition is improving with treatment but not currently at goal.    Rosacea is a chronic progressive skin condition usually affecting the face of adults, causing redness and/or acne bumps. It is treatable but not curable. It sometimes affects the eyes (ocular rosacea) as well. It may respond to topical and/or systemic medication and can flare with stress, sun exposure, alcohol, exercise, topical steroids (including hydrocortisone/cortisone 10) and some foods.  Daily application of broad spectrum spf 30+ sunscreen to face is recommended to reduce flares.  Patient denies grittiness of the eyes  Treatment Plan Cont Metronidazole 0.75% cr qhs   INFLAMED SEBORRHEIC KERATOSIS (4) L popliteal x 1, R ant chest x 1, L pretibia x 1, R med upper  ankle (not treated), L foot dorsum at base of 4th toe x 1 (4) Symptomatic, irritating, patient would like treated  Recheck right medial upper ankle on f/u. Area was not treated with cryotherapy since it is improving on its own. Recommend OTC 1% hydrocortisone cream 1-2 times daily to affected area until itchy rash cleared. Samples of CeraVe SA and CeraVe Psoriasis given.   Destruction of lesion - L popliteal x 1, R ant chest x 1, L pretibia x 1, R med upper ankle (not treated), L foot dorsum at base of 4th toe x 1 (4)  Destruction method: cryotherapy   Informed consent: discussed and consent obtained   Lesion destroyed using liquid nitrogen: Yes   Region frozen until ice ball extended beyond lesion: Yes   Outcome: patient tolerated procedure well with no complications   Post-procedure details: wound care instructions given   Additional details:  Prior to procedure, discussed risks of blister formation, small wound, skin dyspigmentation, or rare scar following cryotherapy. Recommend Vaseline ointment to treated areas while healing.  AK (ACTINIC KERATOSIS) (6) L glabella x 1, L eyebrow x 3, L inf mandible x 1, R lat eyebrow x 1 (6) Actinic keratoses are precancerous spots that appear secondary to cumulative UV radiation exposure/sun exposure over time. They are chronic with expected duration over 1 year. A portion of actinic keratoses will progress to squamous cell carcinoma of the skin. It is not possible to reliably predict which spots will progress to skin cancer and so treatment is recommended to prevent development of skin cancer.  Recommend daily broad spectrum sunscreen SPF 30+ to sun-exposed areas, reapply every 2 hours as needed.  Recommend staying in the shade or wearing long sleeves, sun glasses (UVA+UVB protection) and wide brim hats (4-inch brim around the entire circumference of the hat). Call for new or changing lesions. Destruction of lesion - L glabella x 1, L eyebrow x 3, L inf  mandible x 1, R lat eyebrow x 1 (6)  Destruction method: cryotherapy   Informed consent: discussed and consent obtained   Lesion destroyed using liquid nitrogen: Yes   Region frozen until ice ball extended beyond lesion: Yes   Outcome: patient tolerated procedure well with no complications   Post-procedure details: wound care instructions given   Additional details:  Prior to procedure, discussed risks of blister formation, small wound, skin dyspigmentation, or rare scar following cryotherapy. Recommend Vaseline ointment to treated areas while healing.  Return in about 1 year (around 02/23/2025) for TBSE. Recheck R med upper ankle.Wendee Beavers, CMA, am acting as scribe for Willeen Niece, MD .   Documentation: I have reviewed the above documentation for accuracy and completeness, and I agree with the above.  Willeen Niece, MD

## 2024-03-15 ENCOUNTER — Ambulatory Visit
Admission: RE | Admit: 2024-03-15 | Discharge: 2024-03-15 | Disposition: A | Discharge status: Home / Self Care | Source: Ambulatory Visit | Attending: Acute Care | Admitting: Acute Care

## 2024-03-15 DIAGNOSIS — Z87891 Personal history of nicotine dependence: Secondary | ICD-10-CM | POA: Insufficient documentation

## 2024-03-24 IMAGING — CT CT CHEST LUNG CANCER SCREENING LOW DOSE W/O CM
2 of 5 series · 15 of 40 positions shown, 18 images · non-contrast
Comparison: CT chest lung cancer screening dated February 07, 2021

CLINICAL DATA: Former smoker with 23 pack-year history



[Series 3: lung 1.00 · axial · 0.59mm/px · z∈[-1176,-912]mm · 12 of 292 slices shown, 15 images]
[im 14/292  mediastinal]
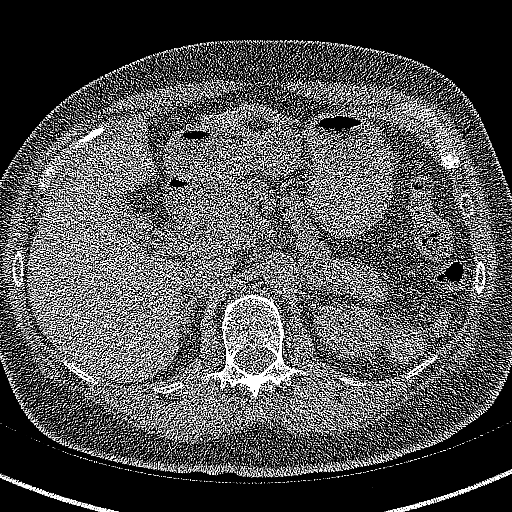
[im 14/292  lung]
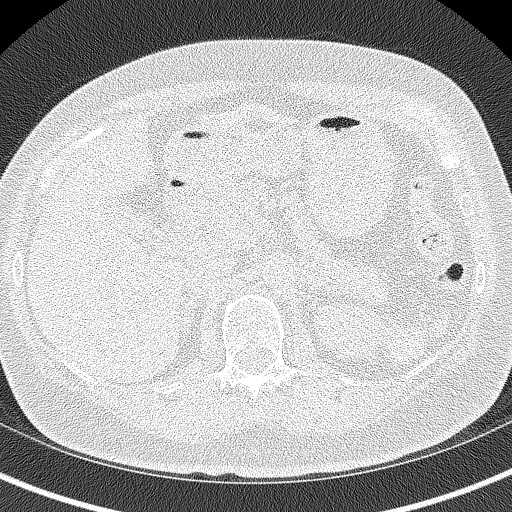
[im 40/292  lung]
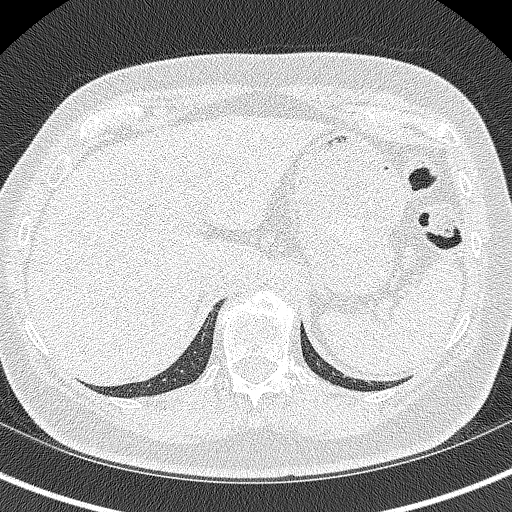
[im 67/292  lung]
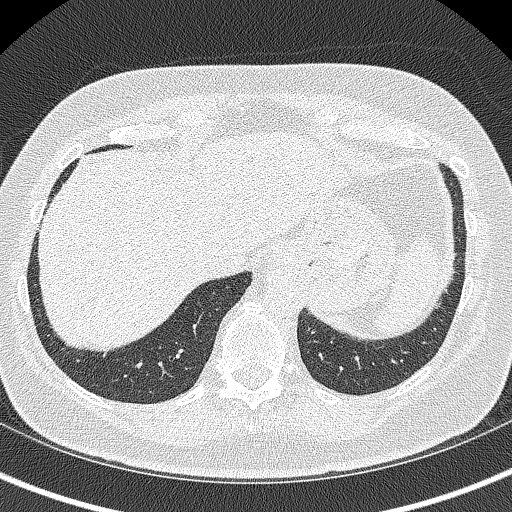
[im 93/292  lung]
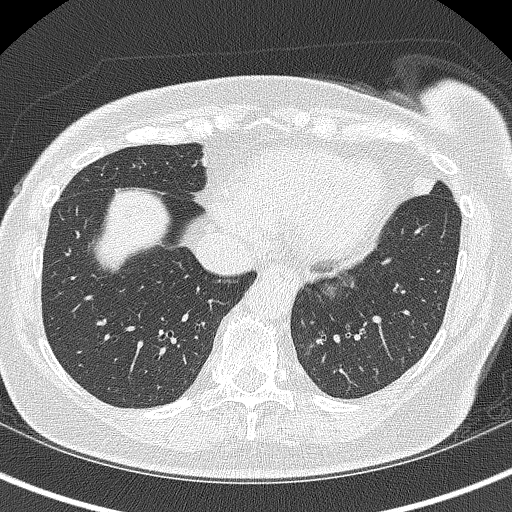
[im 106/292  mediastinal]
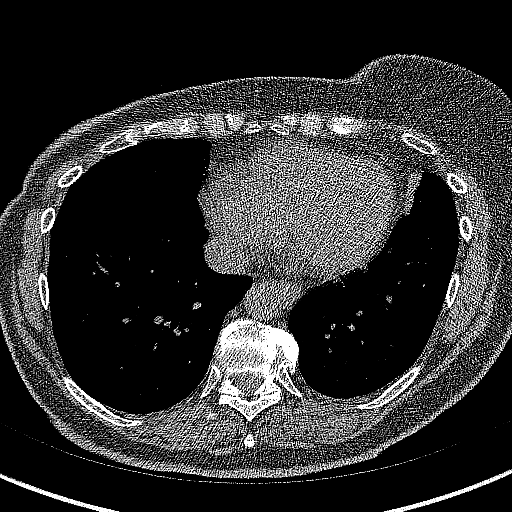
[im 106/292  lung]
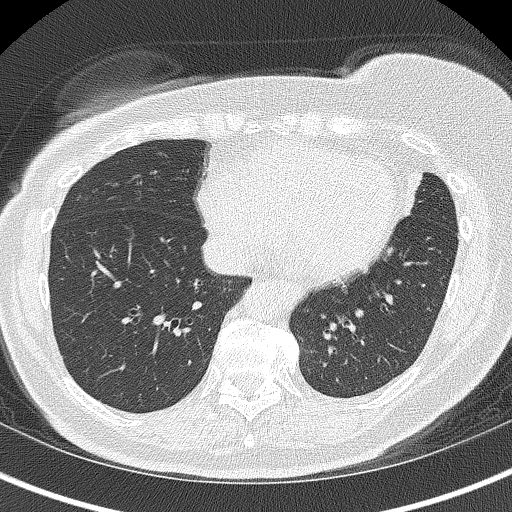
[im 133/292  lung]
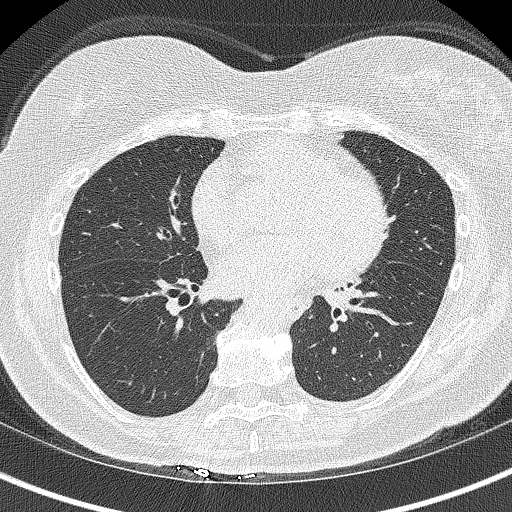
[im 159/292  lung]
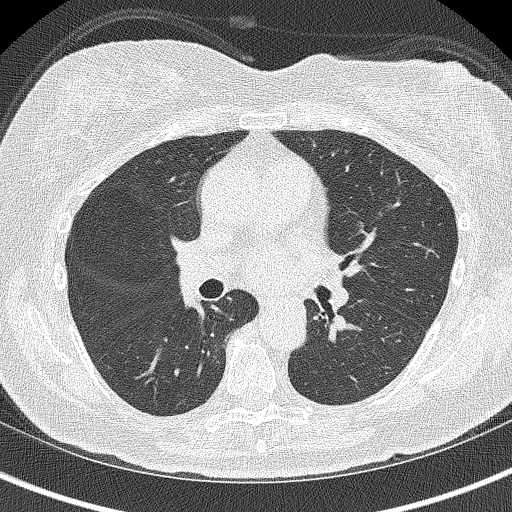
[im 186/292  lung]
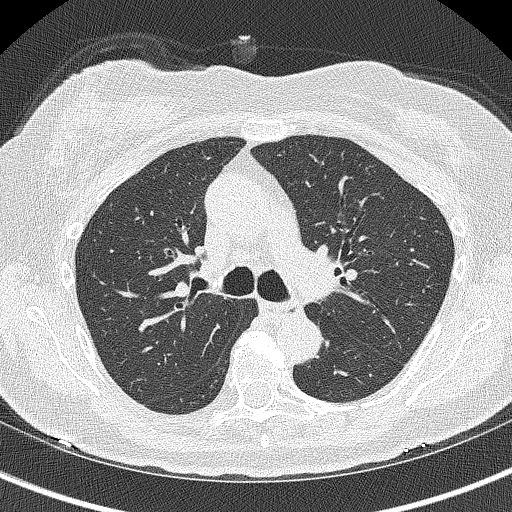
[im 199/292  mediastinal]
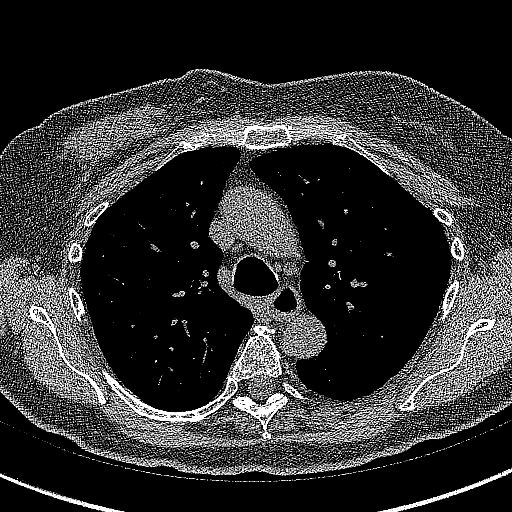
[im 199/292  lung]
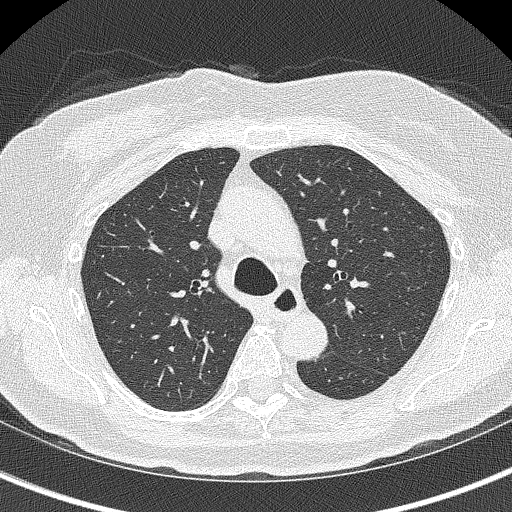
[im 225/292  lung]
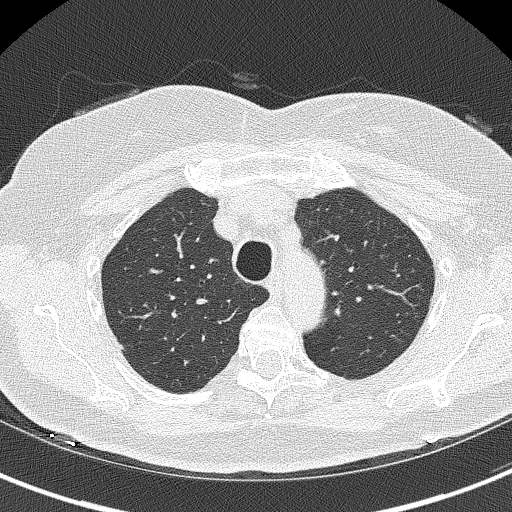
[im 252/292  lung]
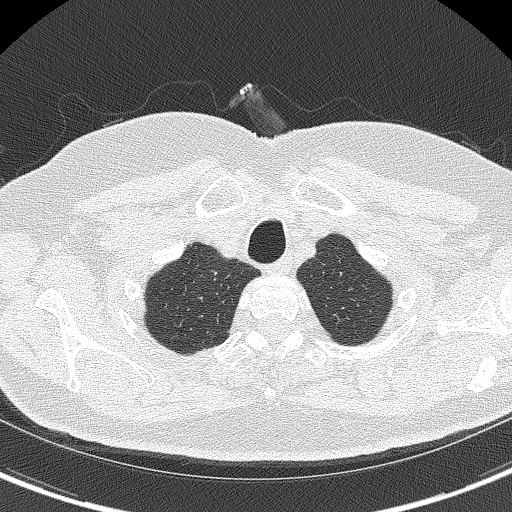
[im 278/292  lung]
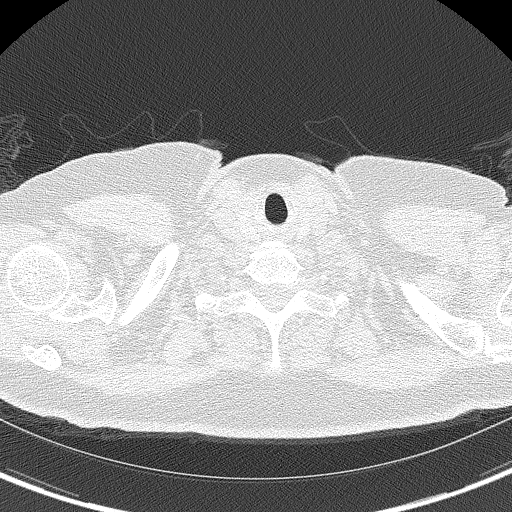

[Series 5: coronals lung 1.00 cor · coronal · 0.57mm/px · 3 of 299 slices shown]
[im 60/299  lung]
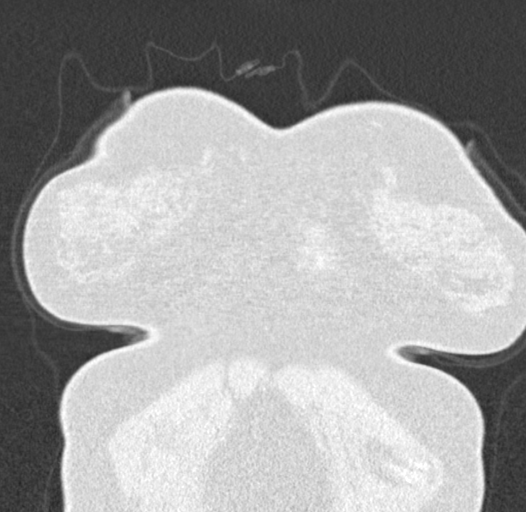
[im 120/299  lung]
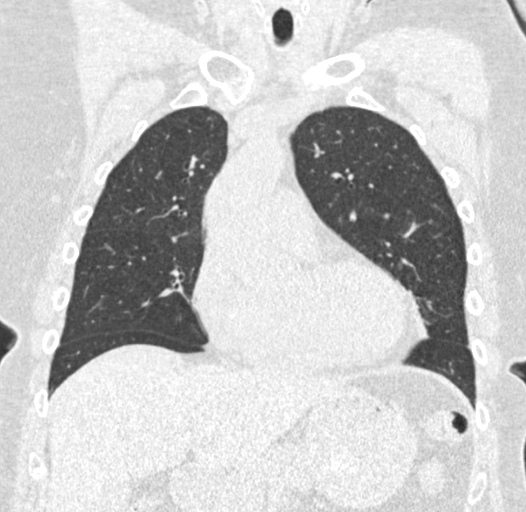
[im 179/299  lung]
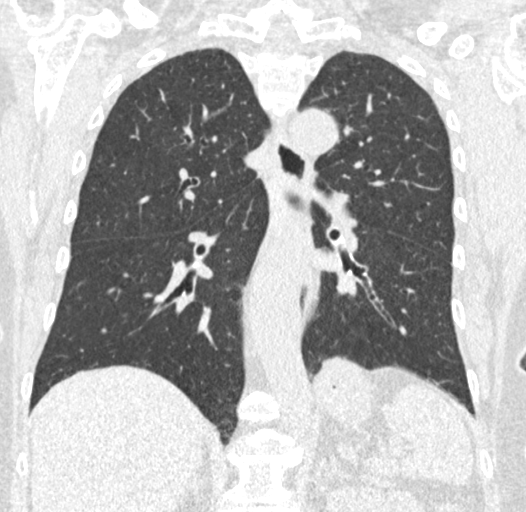

[15 of 40 positions shown; findings below may reference images not displayed]

FINDINGS: Cardiovascular: Normal heart size. No pericardial effusion. Probable
stent of the LAD.

Mediastinum/Nodes: Small hiatal hernia. Thyroid is unremarkable. No
pathologically enlarged lymph nodes seen in the chest.

Lungs/Pleura: Central airways are patent. Mild centrilobular and
paraseptal emphysema. No consolidation, pleural effusion or
pneumothorax. New small ground-glass nodule of the right lung apex
measuring 8.5 mm in mean diameter located on image 51.

Upper Abdomen: No acute abnormality.

Musculoskeletal: No chest wall mass or suspicious bone lesions
identified.
IMPRESSION: 1. Lung-RADS 2, benign appearance or behavior. Continue annual
screening with low-dose chest CT without contrast in 12 months.
2. Aortic Atherosclerosis (YM5QV-TYL.L) and Emphysema (YM5QV-HCT.2).

## 2024-04-16 ENCOUNTER — Telehealth: Payer: Self-pay | Admitting: Acute Care

## 2024-04-16 NOTE — Telephone Encounter (Signed)
 Call report received:  IMPRESSION: Lung-RADS 4A, suspicious. Follow up low-dose chest CT without contrast in 3 months (please use the following order, "CT CHEST LCS NODULE FOLLOW-UP W/O CM") is recommended. Indeterminate new clustered indistinct right lower lobe pulmonary nodules up to 7.9 mm, potentially infectious or inflammatory etiology.   Aortic Atherosclerosis (ICD10-I70.0) and Emphysema (ICD10-J43.9).

## 2024-04-19 ENCOUNTER — Telehealth: Payer: Self-pay | Admitting: Acute Care

## 2024-04-19 ENCOUNTER — Other Ambulatory Visit: Payer: Self-pay

## 2024-04-19 DIAGNOSIS — Z87891 Personal history of nicotine dependence: Secondary | ICD-10-CM

## 2024-04-19 DIAGNOSIS — R918 Other nonspecific abnormal finding of lung field: Secondary | ICD-10-CM

## 2024-04-19 DIAGNOSIS — Z122 Encounter for screening for malignant neoplasm of respiratory organs: Secondary | ICD-10-CM

## 2024-04-19 NOTE — Telephone Encounter (Signed)
 I have called and spoke with the patient. She will complete her 3 month f/u scan on 06/15/2024 at [facility]. Results and plan sent to the PCP. 3 month order placed.

## 2024-04-19 NOTE — Telephone Encounter (Signed)
 I agree with a 3 month follow up on this one. Due end of June 2025. Thanks so much. Please order 3 month follow up and fax results to PCP letting them know plan for follow up.

## 2024-06-15 ENCOUNTER — Ambulatory Visit
Admission: RE | Admit: 2024-06-15 | Discharge: 2024-06-15 | Disposition: A | Discharge status: Home / Self Care | Source: Ambulatory Visit | Attending: Acute Care | Admitting: Acute Care

## 2024-06-15 DIAGNOSIS — Z87891 Personal history of nicotine dependence: Secondary | ICD-10-CM | POA: Diagnosis present

## 2024-06-15 DIAGNOSIS — R918 Other nonspecific abnormal finding of lung field: Secondary | ICD-10-CM | POA: Insufficient documentation

## 2024-06-15 DIAGNOSIS — Z122 Encounter for screening for malignant neoplasm of respiratory organs: Secondary | ICD-10-CM | POA: Insufficient documentation

## 2024-06-28 ENCOUNTER — Other Ambulatory Visit: Payer: Self-pay

## 2024-06-28 DIAGNOSIS — Z122 Encounter for screening for malignant neoplasm of respiratory organs: Secondary | ICD-10-CM

## 2024-06-28 DIAGNOSIS — Z87891 Personal history of nicotine dependence: Secondary | ICD-10-CM

## 2024-08-03 ENCOUNTER — Other Ambulatory Visit: Payer: Self-pay | Admitting: Family Medicine

## 2024-08-03 DIAGNOSIS — Z1231 Encounter for screening mammogram for malignant neoplasm of breast: Secondary | ICD-10-CM

## 2024-08-24 ENCOUNTER — Ambulatory Visit
Admission: RE | Admit: 2024-08-24 | Discharge: 2024-08-24 | Disposition: A | Discharge status: Home / Self Care | Source: Ambulatory Visit | Attending: Family Medicine | Admitting: Family Medicine

## 2024-08-24 DIAGNOSIS — Z1231 Encounter for screening mammogram for malignant neoplasm of breast: Secondary | ICD-10-CM | POA: Diagnosis present

## 2024-09-19 ENCOUNTER — Other Ambulatory Visit: Payer: Self-pay | Admitting: Internal Medicine

## 2024-12-15 ENCOUNTER — Other Ambulatory Visit: Payer: Self-pay | Admitting: Internal Medicine

## 2025-03-14 ENCOUNTER — Encounter: Admitting: Dermatology
